# Patient Record
Sex: Female | Born: 1965
Health system: Southern US, Community
[De-identification: ages and names within clinical notes are randomized; demographics above are authoritative.]

## PROBLEM LIST (undated history)

## (undated) DIAGNOSIS — O24419 Gestational diabetes mellitus in pregnancy, unspecified control: Secondary | ICD-10-CM

## (undated) DIAGNOSIS — I2510 Atherosclerotic heart disease of native coronary artery without angina pectoris: Secondary | ICD-10-CM

## (undated) DIAGNOSIS — E119 Type 2 diabetes mellitus without complications: Secondary | ICD-10-CM

## (undated) DIAGNOSIS — I10 Essential (primary) hypertension: Secondary | ICD-10-CM

## (undated) DIAGNOSIS — I639 Cerebral infarction, unspecified: Secondary | ICD-10-CM

## (undated) HISTORY — PX: BREAST SURGERY: SHX581

## (undated) HISTORY — DX: Atherosclerotic heart disease of native coronary artery without angina pectoris: I25.10

## (undated) HISTORY — DX: Type 2 diabetes mellitus without complications: E11.9

## (undated) HISTORY — DX: Gestational diabetes mellitus in pregnancy, unspecified control: O24.419

## (undated) HISTORY — DX: Essential (primary) hypertension: I10

---

## 1998-08-17 ENCOUNTER — Other Ambulatory Visit: Admission: RE | Admit: 1998-08-17 | Discharge: 1998-08-17 | Payer: Self-pay | Admitting: Obstetrics

## 2000-08-28 ENCOUNTER — Other Ambulatory Visit: Admission: RE | Admit: 2000-08-28 | Discharge: 2000-08-28 | Payer: Self-pay | Admitting: Obstetrics

## 2000-08-30 ENCOUNTER — Encounter: Admission: RE | Admit: 2000-08-30 | Discharge: 2000-08-30 | Payer: Self-pay | Admitting: Obstetrics

## 2000-08-30 ENCOUNTER — Encounter: Payer: Self-pay | Admitting: Obstetrics

## 2001-02-10 ENCOUNTER — Inpatient Hospital Stay (HOSPITAL_COMMUNITY): Admission: AD | Admit: 2001-02-10 | Discharge: 2001-02-10 | Payer: Self-pay | Admitting: *Deleted

## 2003-07-15 ENCOUNTER — Emergency Department (HOSPITAL_COMMUNITY): Admission: EM | Admit: 2003-07-15 | Discharge: 2003-07-15 | Payer: Self-pay | Admitting: Emergency Medicine

## 2004-06-02 ENCOUNTER — Emergency Department (HOSPITAL_COMMUNITY): Admission: EM | Admit: 2004-06-02 | Discharge: 2004-06-02 | Payer: Self-pay | Admitting: Emergency Medicine

## 2004-10-12 ENCOUNTER — Emergency Department (HOSPITAL_COMMUNITY): Admission: EM | Admit: 2004-10-12 | Discharge: 2004-10-12 | Payer: Self-pay | Admitting: Emergency Medicine

## 2004-10-13 ENCOUNTER — Ambulatory Visit (HOSPITAL_COMMUNITY): Admission: RE | Admit: 2004-10-13 | Discharge: 2004-10-13 | Payer: Self-pay | Admitting: Emergency Medicine

## 2006-01-05 ENCOUNTER — Encounter: Admission: RE | Admit: 2006-01-05 | Discharge: 2006-01-05 | Payer: Self-pay | Admitting: Obstetrics & Gynecology

## 2006-03-01 ENCOUNTER — Emergency Department (HOSPITAL_COMMUNITY): Admission: EM | Admit: 2006-03-01 | Discharge: 2006-03-01 | Payer: Self-pay | Admitting: Emergency Medicine

## 2006-12-03 ENCOUNTER — Inpatient Hospital Stay (HOSPITAL_COMMUNITY): Admission: AD | Admit: 2006-12-03 | Discharge: 2006-12-03 | Payer: Self-pay | Admitting: Obstetrics

## 2007-01-07 ENCOUNTER — Inpatient Hospital Stay (HOSPITAL_COMMUNITY): Admission: AD | Admit: 2007-01-07 | Discharge: 2007-01-07 | Payer: Self-pay | Admitting: Obstetrics & Gynecology

## 2007-06-04 ENCOUNTER — Ambulatory Visit (HOSPITAL_COMMUNITY): Admission: RE | Admit: 2007-06-04 | Discharge: 2007-06-04 | Payer: Self-pay | Admitting: Obstetrics & Gynecology

## 2007-08-09 ENCOUNTER — Ambulatory Visit (HOSPITAL_COMMUNITY): Admission: RE | Admit: 2007-08-09 | Discharge: 2007-08-09 | Payer: Self-pay | Admitting: Obstetrics & Gynecology

## 2007-08-16 ENCOUNTER — Ambulatory Visit (HOSPITAL_COMMUNITY): Admission: RE | Admit: 2007-08-16 | Discharge: 2007-08-16 | Payer: Self-pay | Admitting: Obstetrics & Gynecology

## 2007-09-26 ENCOUNTER — Inpatient Hospital Stay (HOSPITAL_COMMUNITY): Admission: AD | Admit: 2007-09-26 | Discharge: 2007-09-28 | Payer: Self-pay | Admitting: Obstetrics

## 2007-10-30 ENCOUNTER — Inpatient Hospital Stay (HOSPITAL_COMMUNITY): Admission: AD | Admit: 2007-10-30 | Discharge: 2007-10-30 | Payer: Self-pay | Admitting: Obstetrics

## 2007-12-11 ENCOUNTER — Inpatient Hospital Stay (HOSPITAL_COMMUNITY): Admission: AD | Admit: 2007-12-11 | Discharge: 2007-12-12 | Payer: Self-pay | Admitting: Obstetrics & Gynecology

## 2007-12-18 ENCOUNTER — Inpatient Hospital Stay (HOSPITAL_COMMUNITY): Admission: AD | Admit: 2007-12-18 | Discharge: 2007-12-20 | Payer: Self-pay | Admitting: Obstetrics

## 2007-12-20 DIAGNOSIS — O24419 Gestational diabetes mellitus in pregnancy, unspecified control: Secondary | ICD-10-CM

## 2007-12-20 HISTORY — DX: Gestational diabetes mellitus in pregnancy, unspecified control: O24.419

## 2007-12-20 HISTORY — PX: TUBAL LIGATION: SHX77

## 2007-12-24 ENCOUNTER — Inpatient Hospital Stay (HOSPITAL_COMMUNITY): Admission: AD | Admit: 2007-12-24 | Discharge: 2007-12-24 | Payer: Self-pay | Admitting: Obstetrics & Gynecology

## 2007-12-25 ENCOUNTER — Inpatient Hospital Stay (HOSPITAL_COMMUNITY): Admission: AD | Admit: 2007-12-25 | Discharge: 2007-12-27 | Payer: Self-pay | Admitting: Obstetrics

## 2008-07-22 ENCOUNTER — Encounter: Admission: RE | Admit: 2008-07-22 | Discharge: 2008-10-08 | Payer: Self-pay | Admitting: Obstetrics & Gynecology

## 2008-09-15 ENCOUNTER — Inpatient Hospital Stay (HOSPITAL_COMMUNITY): Admission: AD | Admit: 2008-09-15 | Discharge: 2008-09-19 | Payer: Self-pay | Admitting: Obstetrics

## 2008-10-25 ENCOUNTER — Inpatient Hospital Stay (HOSPITAL_COMMUNITY): Admission: AD | Admit: 2008-10-25 | Discharge: 2008-10-25 | Payer: Self-pay | Admitting: Obstetrics

## 2008-11-27 ENCOUNTER — Inpatient Hospital Stay (HOSPITAL_COMMUNITY): Admission: AD | Admit: 2008-11-27 | Discharge: 2008-12-01 | Payer: Self-pay | Admitting: Obstetrics & Gynecology

## 2008-11-28 ENCOUNTER — Encounter: Payer: Self-pay | Admitting: Obstetrics & Gynecology

## 2008-12-02 ENCOUNTER — Inpatient Hospital Stay (HOSPITAL_COMMUNITY): Admission: AD | Admit: 2008-12-02 | Discharge: 2008-12-02 | Payer: Self-pay | Admitting: Obstetrics & Gynecology

## 2011-01-10 ENCOUNTER — Encounter: Payer: Self-pay | Admitting: Obstetrics & Gynecology

## 2011-05-03 NOTE — Discharge Summary (Signed)
NAMEJERRIAH, [name]                ACCOUNT NO.:  [id number]   MEDICAL RECORD NO.:  [id number]          PATIENT TYPE:  INP   LOCATION:  9159                          FACILITY:  [facility]   PHYSICIAN:  Roseanna Rainbow, M.D.DATE OF BIRTH:  [birth year]   DATE OF ADMISSION:  09/15/2008  DATE OF DISCHARGE:  09/19/2008                               DISCHARGE SUMMARY   CHIEF COMPLAINT:  The patient is a 45 year old with an intrauterine  pregnancy at 25 weeks with gestational diabetes, now with worsening  hyperglycemia.  Please see the dictated history and physical for further  details.   HOSPITAL COURSE:  The patient was admitted.  Her insulin regimen was  titrated to achieve better glycemic control.  Although suboptimal, her  CBGs had improved during the hospitalization and at the time of  discharge were in the 160-250 range.   DISCHARGE DIAGNOSES:  1. Pregestational diabetes mellitus.  2. Hyperglycemia.  3. Intrauterine pregnancy at 25 plus weeks.   CONDITION:  Stable.   DIET:  ADA diet.   MEDICATIONS:  Lantus and Humalog.   ACTIVITY:  Ad lib.   DISPOSITION:  The patient was to follow up in the office in 1 week.      Roseanna Rainbow, M.D.  Electronically Signed     LAJ/MEDQ  D:  10/17/2008  T:  10/17/2008  Job:  [id number]

## 2011-05-03 NOTE — H&P (Signed)
[name], [name]                ACCOUNT NO.:  [id number]   MEDICAL RECORD NO.:  [id number]          PATIENT TYPE:  INP   LOCATION:  9156                          [facility]AC[facility]  [facility]   PHYSICIAN:  Roseanna Rainbow, M.D.DATE OF BIRTH:  01-30-1966   DATE OF ADMISSION:  09/26/2007  DATE OF DISCHARGE:                              HISTORY & PHYSICAL   CHIEF COMPLAINT:  The patient is a 45 year old gravida 8, para 3, with  an estimated date of confinement of January 12, 2008, with an  intrauterine pregnancy at 24+ weeks and gestational diabetes with acute  hyperglycemia.   HISTORY OF PRESENT ILLNESS:  Please see the above.  The patient had  previously been managed with glyburide; however, her fasting CBGs  remained in poor control.  This week per her report her regimen was  changed to Lantus and Humalog.  On presentation to the office today,  urine dip was remarkable for 4+ glycosuria, no ketones, and a CBG was  302.   ALLERGIES:  No known drug allergies.   MEDICATIONS:  Please see the medication reconciliation form.   OB RISK FACTORS:  1. Low-lying placenta.  2. Gestational diabetes.  3. Sickle cell trait.  The father of the baby is hemoglobin AA per her      report.  4. Uterine fibroids.  5. Advanced maternal age.  6. A history of a large for gestational age infant.   PAST OBSTETRICAL HISTORY:  In October 1990 there was in a voluntary  termination of pregnancy.  In March 1992 there was a spontaneous  abortion.  In September 1994 she was delivered of a liveborn female, 5  pounds 14 ounces, post-dates vaginal delivery.  In October 1997 she was  delivered of a 9 pound female vaginal delivery, full-term.  In May 2002  she was delivered of a liveborn female, 8 pounds, full-term vaginal  delivery.  This pregnancy was complicated by gestational diabetes.  In  December 2005 there was a voluntary termination of pregnancy.  In  January 2008 there was a spontaneous abortion.   PRENATAL LABS:  Chlamydia probe negative.  Urine culture and  sensitivity:  No growth.  GC probe negative.  Hepatitis B surface  antigen negative.  Hematocrit 35, hemoglobin 11.5.  HIV nonreactive.  Rubella immune.  Sickle cell positive.  Blood type is B+, antibody  screen negative.   PAST GYN HISTORY:  Noncontributory.   PAST MEDICAL HISTORY:  No significant history of medical diseases.   PAST SURGICAL HISTORY:  Bilateral breast reduction.   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  She is married, living with her spouse.  Does not give any significant  history of alcohol usage, has no significant smoking history.  Denies  illicit drug use.   FAMILY HISTORY:  Adult-onset diabetes, hypertension, prostate cancer.   PHYSICAL EXAM:  VITAL SIGNS:  Stable, afebrile.  Please see the above.  GENERAL:  No apparent distress.  ABDOMEN:  Gravid.  PELVIC EXAM:  Deferred.   ASSESSMENT:  Multipara at 24 weeks with gestational diabetes,  likely  adult-onset diabetes type 2 to with hyperglycemia.   PLAN:  Admission.  We will adjust her insulin regimen and cover as  needed.  IV hydration.  Check a CBC, CMET, urinalysis and urine culture  and sensitivity.  Heightened fetal surveillance as well with NSTs each  shift.      Roseanna Rainbow, M.D.  Electronically Signed     LAJ/MEDQ  D:  09/26/2007  T:  09/27/2007  Job:  [id number]

## 2011-05-03 NOTE — Discharge Summary (Signed)
[name], [name]                ACCOUNT NO.:  [id number]   MEDICAL RECORD NO.:  [id number]          PATIENT TYPE:  INP   LOCATION:  9156                          [facility]   PHYSICIAN:  Roseanna Rainbow, M.D.DATE OF BIRTH:  04-09-1966   DATE OF ADMISSION:  09/26/2007  DATE OF DISCHARGE:  09/28/2007                               DISCHARGE SUMMARY   CHIEF COMPLAINT:  The patient is a 45 year old gravida 8, para 3, with  an estimated date of confinement of January 24 with an intrauterine  pregnancy at 24+ weeks with gestational diabetes and acute  hyperglycemia; please see the dictated history and physical for further  details.   HOSPITAL COURSE:  The patient was admitted.  Her insulin regimen was  continued, as well as a sliding scale for coverage.  A urinalysis was  remarkable for greater than 1000 mg/dL of glucose.  Hemoglobin was 11.2;  white blood cell count was 9300.  Her CBGs were in the 160-230 range.  Her Lantus dose was adjusted.  She was subsequently discharged to home.   DISCHARGE DIAGNOSES:  1. Intrauterine pregnancy at 24+ weeks.  2. Gestational diabetes.  3. Acute hyperglycemia.   CONDITION:  Stable.   DIET:  ADA diet.   MEDICATIONS:  Included Lantus and Humalog and prenatal vitamins.   DISPOSITION:  The patient was to follow up in the office in several  days.      Roseanna Rainbow, M.D.  Electronically Signed     LAJ/MEDQ  D:  10/19/2007  T:  10/21/2007  Job:  [id number]

## 2011-05-03 NOTE — H&P (Signed)
NAMELEYAN, BRANDEN                ACCOUNT NO.:  [id number]   MEDICAL RECORD NO.:  [id number]          PATIENT TYPE:  INP   LOCATION:  9159                          [facility]   PHYSICIAN:  Roseanna Rainbow, M.D.DATE OF BIRTH:  1966-09-14   DATE OF ADMISSION:  09/15/2008  DATE OF DISCHARGE:                              HISTORY & PHYSICAL   CHIEF COMPLAINT:  The patient is a 45 year old with an intrauterine  pregnancy at 25 weeks with gestational diabetes, now with worsening  hyperglycemia.   HISTORY OF PRESENT ILLNESS:  Please see the above.  The patient has been  followed collaboratively at [facility] for management of her  diabetes.  The patient is quite insulin resistance, and there is also  quite good questionable compliance with the diet.  When she presented to  the office earlier today, her random CBG was 400.  On urine dip in the  office, there were no ketones present.  The patient also complained of  increased sweating.  No other complaints.   PAST OB RISK FACTORS ADVANCED MATERNAL AGE:  Please see the above.   PAST SURGICAL HISTORY:  Breast reduction.   PAST MEDICAL HISTORY:  Please see the above.   MEDICATIONS:  Please see the medication reconciliation form.   ALLERGIES:  No known drug allergies.   SOCIAL HISTORY:  She is married.  She denies any tobacco, ethanol, or  drug use.   FAMILY HISTORY:  Positive for diabetes and hypertension.   PAST OBSTETRICAL HISTORY:  There is a history of 4 spontaneous vaginal  deliveries.   REVIEW OF SYSTEMS:  General, please see the above.   PHYSICAL EXAMINATION:  VITAL SIGNS:  Stable.  Afebrile.  Fetal heart  tracing consistent with a 25-week gestation.  PELVIC:  Deferred.   LABORATORIES:  Sodium 133, glucose 292, white blood cell count 9.5,  hemoglobin of 11.2, urine specific gravity less than 1.005.  Urine  glucose greater than 1000, ketones negative.   ASSESSMENT:  Intrauterine pregnancy at 25 weeks with   gestational  diabetes, now with hyperglycemia.   PLAN:  Admission, titrate the patient's out patient regimen.  We will  cover for elevated CBCs with a sliding scale.  IV hydration.      Roseanna Rainbow, M.D.  Electronically Signed     LAJ/MEDQ  D:  09/16/2008  T:  09/16/2008  Job:  [id number]

## 2011-05-03 NOTE — Op Note (Signed)
[name], [name]                ACCOUNT NO.:  [id number]   MEDICAL RECORD NO.:  [id number]          PATIENT TYPE:  INP   LOCATION:  9310                          [facility]   PHYSICIAN:  Roseanna Rainbow, M.D.DATE OF BIRTH:  [birth year]   DATE OF PROCEDURE:  11/27/2008  DATE OF DISCHARGE:                               OPERATIVE REPORT   PREOPERATIVE DIAGNOSES:  1. Intrauterine pregnancy at 34+ weeks.  2. Gestational diabetes, insulin dependent.  3. Acute hyperglycemia, hyperosmolar  4. Biophysical profile 4/8 variable decelerations  5. Desires a sterilization procedure.   POSTOPERATIVE DIAGNOSES:  1. Intrauterine pregnancy at 34+ weeks.  2. Gestational diabetes, insulin dependent.  3. Acute hyperglycemia, hyperosmolar  4. Biophysical profile 4/8 variable decelerations  5. Desires a sterilization procedure.  6. Uterine fibroids.   PROCEDURE:  Primary low-uterine flap elliptical cesarean delivery,  myomectomy, modified Pomeroy bilateral tubal ligation via a Pfannenstiel  skin incision.   SURGEON:  Roseanna Rainbow, MD   ANESTHESIA:  Spinal.   FINDINGS:  Lower uterine segment intramural myoma approximately 6 cm in  diameter, necrotic.   PATHOLOGY:  Myoma, portions of fallopian tubes.   ESTIMATED BLOOD LOSS:  600 mL.   COMPLICATIONS:  None.   PROCEDURE:  The patient was taken to the operating room with an IV  running.  She was given a spinal anesthetic.  She was then placed in the  dorsal supine position with left lateral tilt.  She was prepped and  draped in the usual sterile fashion.  After a time-out had been  completed, a Pfannenstiel skin incision was then made with a scalpel.  This was carried down to the underlying fascia.  The fascia was nicked  in the midline.  The fascial incision was then extended bilaterally.  The superior aspect of the fascial incision was tented up and the  underlying rectus muscles dissected off.  The inferior aspect of  the  fascial incision was manipulated in a similar fashion.  The rectus  muscles were separated in the midline.  The parietal peritoneum was  tented up and entered sharply.  This incision was then extended  superiorly and inferiorly with good visualization of the bladder.  The  bladder blade was then placed.  The vesicouterine peritoneum was tented  up and entered sharply.  This incision was then extended bilaterally and  the bladder flap created bluntly.  The lower uterine segment was incised  in a transverse fashion with a scalpel.  This incision was then extended  with the bandage scissors.  The infant's head was then delivered  atraumatically.  The oropharynx was suctioned with bulb suction.  The  cord was clamped and cut.  The infant was handed off to the awaiting  neonatologist.  Then, the umbilical artery pH was sent.  The Apgars were  3 and 6 at one and five minutes respectively.  The placenta was then  removed.  The intrauterine cavity was evacuated of any remaining  amniotic fluid clots and debris with moistened laparotomy sponge.  At  this point, the lower uterine segment myoma that  was visualized and  palpated prior to the uterine incision was noted.  To facilitate closure  of the uterine incision, the decision was made to excise the myoma.  The  serosa overlying the myoma was infiltrated with a dilute Pitressin  solution.  The serosa was incised using the Bovie down to the fibrous  capsule of the myoma.  The myoma was then grasped with a towel clip and  using sharp and blunt dissection, it was enucleated.  The defect was  then closed in layers using running sutures of 0-Monocryl.  The serosa  was closed with a running suture of 2-0 Monocryl.  The uterine incision  was then reapproximated in a running interlocking fashion.  Adequate  hemostasis was noted.  The mid isthmic portion of the left fallopian  tube was then grasped.  A 1-2 cm segment of tube was then doubly ligated   with 0-plain and excised.  The right fallopian tube was manipulated in a  similar fashion.  The paracolic gutters were then copiously irrigated.  The parietal peritoneum was reapproximated in a running fashion using 2-  0 Vicryl.  This fascia was closed using 2 sutures of 0-Vicryl in a  running fashion tied in the midline.  The skin was closed with staples.  At the close of the procedure, the instrument and pack counts were said  to be correct x2.  The patient was taken to the PACU awake and in stable  condition.      Roseanna Rainbow, M.D.  Electronically Signed     LAJ/MEDQ  D:  11/28/2008  T:  11/28/2008  Job:  [id number]

## 2011-05-03 NOTE — Discharge Summary (Signed)
[name], [name]                ACCOUNT NO.:  [id number]   MEDICAL RECORD NO.:  [id number]          PATIENT TYPE:  INP   LOCATION:  9310                          [facility]ILITY[facility]   PHYSICIAN:  Charles A. Clearance Coots, M.D.DATE OF BIRTH:  26-Oct-1966   DATE OF ADMISSION:  11/27/2008  DATE OF DISCHARGE:  12/01/2008                               DISCHARGE SUMMARY   ADMITTING DIAGNOSES:  1. 34 weeks' gestation.  2. Gestational diabetes mellitus.  3. Variable fetal heart rate decelerations.  4. Recent onset of acute hyperglycemic hyperosmolar episode.  5. Desired permanent sterilization.   DISCHARGE DIAGNOSES:  1. 34 weeks' gestation.  2. Gestational diabetes mellitus.  3. Variable fetal heart rate decelerations.  4. Recent onset of acute hyperglycemic hyperosmolar episode.  5. Desired permanent sterilization.  6. Status post primary low-transverse cesarean section.  7. Myomectomy.  8. Modified Pomeroy bilateral tubal ligation on November 28, 2008.   Apgars were 3 at one minute and 6 at five minutes.  Mother discharged  home on postop day #3 in good condition.  Infant was the taken to the  Neonatal Intensive Care Unit for prematurity   HOSPITAL COURSE:  A 45 year old para 4, estimated date of confinement of  December 29, 2008, with gestational diabetes requiring insulin and oral  hypoglycemic agent, presented to the office with nonreactive nonstress  test and decreased fetal movement.  NST revealed fetal heart rate 160-  170 beats per minute with spontaneous moderate variable fetal heart rate  decelerations.  Antenatal testing at [facility] earlier this week  revealed amniotic fluid index of 9.  The patient has had decreased  appetite.  Last insulin dose was this a.m.   PAST SURGERY:  Breast reduction.   ILLNESSES:  Gestational diabetes, migraine headaches.   MEDICATIONS:  Insulin, Humalog, metformin, prenatal vitamins.   ALLERGIES:  No known drug allergies.   SOCIAL HISTORY:   Works in Clinical biochemist.  Married.  Negative tobacco,  alcohol, or recreational drug use.   PAST OBSTETRICAL HISTORY:  Remarkable for 4 normal spontaneous vaginal  deliveries, uncomplicated.   PHYSICAL EXAMINATION:  Well nourished, well developed female in no acute  distress.  Fetal heart tone was 150-160 beats per minute.  No  accelerations.  Spontaneous mild-to-moderate variable decelerations.  There were no uterine contractions.   ADMITTING LABS:  Hemoglobin 13, glucose 436, potassium 4.3, sodium 130.   HOSPITAL COURSE:  The patient was admitted and biophysical profile was  done, which revealed 4/10.  Fetal status appeared to be improving with  better glycemic control after Glucometer and the use of Glucomander to  reduce the glucose serum level.  Repeat biophysical profile was done the  following morning after admission after the patient continued to have  periodic moderate variable fetal heart rate decelerations over 90.  Repeat biophysical profile was 4/8.  A decision was made to proceed with  cesarean section delivery because of the persistent moderate fetal heart  rate variable decelerations with gestational diabetes for glucose  control.  Primary low-transverse cesarean section and bilateral tubal  ligation were performed on November 28, 2008.  There were no  intraoperative complications.  Postoperative course was uncomplicated.  The patient was discharged home on postop day #3 in good condition.   DISCHARGE LABS:  Hemoglobin 9.4, hematocrit 28.2, white blood cell count  9700, platelets 155,000.  Capillary blood glucose was 188.   DISCHARGE DISPOSITION:  Medications, continue prenatal vitamins,  metformin 2 g p.o. with dinner daily was prescribed for glucose control.  The patient will follow up with Endocrinology for further management of  possible adult-onset diabetes.  Darvocet-N 100 and ibuprofen was  prescribed for pain.  Routine written instructions were given for   discharge after cesarean section.  The patient is to call office for  followup appointment in 2 weeks.      Charles A. Clearance Coots, M.D.  Electronically Signed     CAH/MEDQ  D:  12/01/2008  T:  12/01/2008  Job:  [id number]

## 2011-09-08 LAB — WET PREP, GENITAL: Yeast Wet Prep HPF POC: NONE SEEN

## 2011-09-08 LAB — CBC
HCT: 38.5
Hemoglobin: 10.1 — ABNORMAL LOW
MCHC: 33.6
Platelets: 240
RBC: 3.71 — ABNORMAL LOW
WBC: 10.8 — ABNORMAL HIGH

## 2011-09-19 LAB — BASIC METABOLIC PANEL
BUN: 4 — ABNORMAL LOW
CO2: 23
Chloride: 103
Creatinine, Ser: 0.49

## 2011-09-19 LAB — URINE CULTURE
Colony Count: >=100000
Special Requests: NEGATIVE

## 2011-09-19 LAB — GLUCOSE, CAPILLARY
Glucose-Capillary: 175 — ABNORMAL HIGH
Glucose-Capillary: 186 — ABNORMAL HIGH
Glucose-Capillary: 186 — ABNORMAL HIGH
Glucose-Capillary: 201 — ABNORMAL HIGH
Glucose-Capillary: 240 — ABNORMAL HIGH
Glucose-Capillary: 330 — ABNORMAL HIGH
Glucose-Capillary: 123 — ABNORMAL HIGH
Glucose-Capillary: 212 — ABNORMAL HIGH
Glucose-Capillary: 223 — ABNORMAL HIGH

## 2011-09-19 LAB — OSMOLALITY: Osmolality: 285

## 2011-09-19 LAB — DIFFERENTIAL
Basophils Relative: 0
Eosinophils Absolute: 0.1
Eosinophils Relative: 1
Monocytes Relative: 6
Neutrophils Relative %: 71

## 2011-09-19 LAB — CBC
HCT: 34.1 — ABNORMAL LOW
MCHC: 32.9
MCV: 84.1
Platelets: 231

## 2011-09-19 LAB — URINALYSIS, ROUTINE W REFLEX MICROSCOPIC
Ketones, ur: NEGATIVE
Leukocytes, UA: NEGATIVE
Nitrite: NEGATIVE
Protein, ur: NEGATIVE
Urobilinogen, UA: 0.2

## 2011-09-23 LAB — CBC
HCT: 28.2 % — ABNORMAL LOW (ref 36.0–46.0)
HCT: 41.1 % (ref 36.0–46.0)
Hemoglobin: 13.6 g/dL (ref 12.0–15.0)
Hemoglobin: 9.4 g/dL — ABNORMAL LOW (ref 12.0–15.0)
MCHC: 33 g/dL (ref 30.0–36.0)
MCHC: 33.5 g/dL (ref 30.0–36.0)
MCV: 81 fL (ref 78.0–100.0)
MCV: 81.6 fL (ref 78.0–100.0)
Platelets: 155 10*3/uL (ref 150–400)
Platelets: 207
RDW: 14.7 % (ref 11.5–15.5)
RDW: 15.2 % (ref 11.5–15.5)
RDW: 14.2
WBC: 9.8

## 2011-09-23 LAB — URINALYSIS, ROUTINE W REFLEX MICROSCOPIC
Bilirubin Urine: NEGATIVE
Glucose, UA: 250 — AB
Hgb urine dipstick: NEGATIVE
Ketones, ur: NEGATIVE
Protein, ur: NEGATIVE
Urobilinogen, UA: 0.2

## 2011-09-23 LAB — COMPREHENSIVE METABOLIC PANEL
AST: 18
Albumin: 2.9 — ABNORMAL LOW
Alkaline Phosphatase: 142 U/L — ABNORMAL HIGH (ref 39–117)
Alkaline Phosphatase: 136 — ABNORMAL HIGH
BUN: 5 mg/dL — ABNORMAL LOW (ref 6–23)
BUN: 8 mg/dL (ref 6–23)
BUN: 3 — ABNORMAL LOW
CO2: 26 mEq/L (ref 19–32)
Calcium: 9.8 mg/dL (ref 8.4–10.5)
Chloride: 106 mEq/L (ref 96–112)
Chloride: 104
Creatinine, Ser: 0.55
GFR calc Af Amer: >60
GFR calc non Af Amer: >60 mL/min (ref >60)
Glucose, Bld: 163 mg/dL — ABNORMAL HIGH (ref 70–99)
Glucose, Bld: 436 mg/dL — ABNORMAL HIGH (ref 70–99)
Potassium: 3.8 mEq/L (ref 3.5–5.1)
Potassium: 3.9
Total Bilirubin: 0.3 mg/dL (ref 0.3–1.2)
Total Bilirubin: 0.5
Total Protein: 5.2 g/dL — ABNORMAL LOW (ref 6.0–8.3)
Total Protein: 6.3 g/dL (ref 6.0–8.3)
Total Protein: 6.4

## 2011-09-23 LAB — CREATININE CLEARANCE, URINE, 24 HOUR
Collection Interval-CRCL: 24
Creatinine, 24H Ur: 889
Creatinine, Urine: 50.8
Creatinine: 0.55
Urine Total Volume-CRCL: 1750

## 2011-09-23 LAB — GLUCOSE, CAPILLARY
Glucose-Capillary: 186 mg/dL — ABNORMAL HIGH (ref 70–99)
Glucose-Capillary: 188 mg/dL — ABNORMAL HIGH (ref 70–99)
Glucose-Capillary: 191 mg/dL — ABNORMAL HIGH (ref 70–99)
Glucose-Capillary: 203 mg/dL — ABNORMAL HIGH (ref 70–99)
Glucose-Capillary: 284 mg/dL — ABNORMAL HIGH (ref 70–99)
Glucose-Capillary: 346 mg/dL — ABNORMAL HIGH (ref 70–99)

## 2011-09-23 LAB — URINE CULTURE
Colony Count: 65000
Special Requests: NEGATIVE

## 2011-09-23 LAB — URIC ACID: Uric Acid, Serum: 5

## 2011-09-23 LAB — RPR
RPR Ser Ql: NONREACTIVE
RPR Ser Ql: NONREACTIVE

## 2011-09-23 LAB — PROTEIN, URINE, 24 HOUR
Protein, 24H Urine: 88
Urine Total Volume-UPROT: 1750

## 2011-09-23 LAB — SAMPLE TO BLOOD BANK

## 2011-09-27 LAB — COMPREHENSIVE METABOLIC PANEL
AST: 18
Albumin: 2.7 — ABNORMAL LOW
Chloride: 102
Creatinine, Ser: 0.55
GFR calc Af Amer: >60
Potassium: 3.7
Sodium: 134 — ABNORMAL LOW
Total Bilirubin: 0.2 — ABNORMAL LOW

## 2011-09-27 LAB — URINALYSIS, ROUTINE W REFLEX MICROSCOPIC
Bilirubin Urine: NEGATIVE
Glucose, UA: 250 — AB
Hgb urine dipstick: NEGATIVE
Ketones, ur: NEGATIVE
Nitrite: NEGATIVE
Protein, ur: NEGATIVE
Specific Gravity, Urine: 1.015
Urobilinogen, UA: 0.2
pH: 5.5

## 2011-09-27 LAB — CBC
MCV: 81.8
Platelets: 231
WBC: 8.9

## 2011-09-29 LAB — COMPREHENSIVE METABOLIC PANEL
ALT: 14
AST: 27
Alkaline Phosphatase: 67
CO2: 23
Chloride: 103
GFR calc Af Amer: >60
GFR calc non Af Amer: >60
Sodium: 134 — ABNORMAL LOW
Total Bilirubin: 0.7

## 2011-09-29 LAB — URINALYSIS, ROUTINE W REFLEX MICROSCOPIC
Ketones, ur: NEGATIVE
Leukocytes, UA: NEGATIVE
Nitrite: NEGATIVE
Protein, ur: NEGATIVE

## 2011-09-29 LAB — URINE CULTURE: Colony Count: 50000

## 2011-09-29 LAB — URINE MICROSCOPIC-ADD ON

## 2011-09-29 LAB — CBC
MCV: 82.5
RBC: 3.99
WBC: 9.3

## 2011-09-29 LAB — DIFFERENTIAL
Basophils Absolute: 0
Eosinophils Absolute: 0.1
Eosinophils Relative: 1

## 2011-12-20 DIAGNOSIS — E119 Type 2 diabetes mellitus without complications: Secondary | ICD-10-CM

## 2011-12-20 HISTORY — DX: Type 2 diabetes mellitus without complications: E11.9

## 2014-09-07 DIAGNOSIS — I10 Essential (primary) hypertension: Secondary | ICD-10-CM

## 2014-09-07 DIAGNOSIS — I639 Cerebral infarction, unspecified: Secondary | ICD-10-CM

## 2014-09-07 HISTORY — DX: Essential (primary) hypertension: I10

## 2014-09-07 HISTORY — DX: Cerebral infarction, unspecified: I63.9

## 2015-01-27 ENCOUNTER — Encounter (HOSPITAL_COMMUNITY): Payer: Self-pay

## 2015-01-27 ENCOUNTER — Emergency Department (HOSPITAL_COMMUNITY)
Admission: EM | Admit: 2015-01-27 | Discharge: 2015-01-27 | Disposition: A | Discharge status: Home / Self Care | Payer: Medicaid Other | Attending: Emergency Medicine | Admitting: Emergency Medicine

## 2015-01-27 DIAGNOSIS — Y998 Other external cause status: Secondary | ICD-10-CM | POA: Diagnosis not present

## 2015-01-27 DIAGNOSIS — X12XXXA Contact with other hot fluids, initial encounter: Secondary | ICD-10-CM | POA: Diagnosis not present

## 2015-01-27 DIAGNOSIS — Z8673 Personal history of transient ischemic attack (TIA), and cerebral infarction without residual deficits: Secondary | ICD-10-CM | POA: Insufficient documentation

## 2015-01-27 DIAGNOSIS — T2122XA Burn of second degree of abdominal wall, initial encounter: Secondary | ICD-10-CM

## 2015-01-27 DIAGNOSIS — Y9389 Activity, other specified: Secondary | ICD-10-CM | POA: Diagnosis not present

## 2015-01-27 DIAGNOSIS — Y9289 Other specified places as the place of occurrence of the external cause: Secondary | ICD-10-CM | POA: Insufficient documentation

## 2015-01-27 HISTORY — DX: Cerebral infarction, unspecified: I63.9

## 2015-01-27 MED ORDER — SILVER SULFADIAZINE 1 % EX CREA
TOPICAL_CREAM | Freq: Once | CUTANEOUS | Status: AC
  Administered 2015-01-27: 11:00:00 via TOPICAL
  Filled 2015-01-27: qty 50
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
## 2015-01-27 NOTE — Discharge Instructions (Signed)
Apply Silvadene cream twice daily. Follow-up with your primary care physician in one week for reevaluation, if you do not have one, follow-up with the wellness clinic or the urgent care center.  Burn Care Your skin is a natural barrier to infection. It is the largest organ of your body. Burns damage this natural protection. To help prevent infection, it is very important to follow your caregiver's instructions in the care of your burn. Burns are classified as:  First degree. There is only redness of the skin (erythema). No scarring is expected.  Second degree. There is blistering of the skin. Scarring may occur with deeper burns.  Third degree. All layers of the skin are injured, and scarring is expected. HOME CARE INSTRUCTIONS   Wash your hands well before changing your bandage.  Change your bandage as often as directed by your caregiver.  Remove the old bandage. If the bandage sticks, you may soak it off with cool, clean water.  Cleanse the burn thoroughly but gently with mild soap and water.  Pat the area dry with a clean, dry cloth.  Apply a thin layer of antibacterial cream to the burn.  Apply a clean bandage as instructed by your caregiver.  Keep the bandage as clean and dry as possible.  Elevate the affected area for the first 24 hours, then as instructed by your caregiver.  Only take over-the-counter or prescription medicines for pain, discomfort, or fever as directed by your caregiver. SEEK IMMEDIATE MEDICAL CARE IF:   You develop excessive pain.  You develop redness, tenderness, swelling, or red streaks near the burn.  The burned area develops yellowish-white fluid (pus) or a bad smell.  You have a fever. MAKE SURE YOU:   Understand these instructions.  Will watch your condition.  Will get help right away if you are not doing well or get worse. Document Released: 12/05/2005 Document Revised: 02/27/2012 Document Reviewed: 04/27/2011 [facility] Patient  Information 2015 [place], [place]. This information is not intended to replace advice given to you by your health care provider. Make sure you discuss any questions you have with your health care provider.  Second-Degree Burn A second-degree burn affects the 2 outer layers of skin. The outer layer (epidermis) and the layer underneath it (dermis) are both burned. Another name for this type of burn is a partial thickness burn. A second-degree burn may be called minor or major. This depends on the size of the burn. It also depends on what parts of the skin are burned. Minor burns may be treated with first aid. Major burns are a medical emergency. A second-degree burn is worse than a first-degree burn, but not as bad as a third-degree burn. A first-degree burn affects only the epidermis. A third-degree burn goes through all the layers of skin. A second-degree burn usually heals in 3 to 4 weeks. A minor second-degree burn usually does not leave a scar.Deeper second-degree burns may lead to scarring of the skin or contractures over joints.Contractures are scars that form over joints and may lead to reduced mobility at those joints. CAUSES  Heat (thermal) injury. This happens when skin comes in contact with something very hot. It could be a flame, a hot object, hot liquid, or steam. Most second-degree burns are thermal injuries.  Radiation. Sunlight is one type of radiation that can burn the skin. Another type of radiation is used to heat food. Radiation is also used to treat some diseases, such as cancer. All types of radiation can burn the  skin. Sunlight usually causes a first-degree burn. Radiation used for heating food or treating a disease can cause a second-degree burn.  Electricity. Electrical burns can cause more damage under the skin than on the surface. They should always be treated as major burns.  Chemicals. Many chemicals can burn the skin. The burn should be flushed with cool water and checked by  an emergency caregiver. SYMPTOMS Symptoms of second-degree burns include:  Severe pain.  Extreme tenderness.  Deep redness.  Blistered skin.  Skin that has changed color.It might look blotchy, wet, or shiny.  Swelling. TREATMENT Some second-degree burns may need to be treated in a hospital. These include major burns, electrical burns, and chemical burns. Many other second-degree burns can be treated with regular first aid, such as:  Cooling the burn. Use cool, germ-free (sterile) salt water. Place the burned area of skin into a tub of water, or cover the burned area with clean, wet towels.  Taking pain medicine.  Removing the dead skin from broken blisters. A trained caregiver may do this. Do not pop blisters.  Gently washing your skin with mild soap.  Covering the burned area with a cream.Silver sulfadiazine is a cream for burns. An antibiotic cream, such as bacitracin, may also be used to fight infection. Do not use other ointments or creams unless your caregiver says it is okay.  Protecting the burn with a sterile, non-sticky bandage.  Bandaging fingers and toes separately. This keeps them from sticking together.  Taking an antibiotic. This can help prevent infection.  Getting a tetanus shot. HOME CARE INSTRUCTIONS Medication  Take any medicine prescribed by your caregiver. Follow the directions carefully.  Ask your caregiver if you can take over-the-counter medicine to relieve pain and swelling. Do not give aspirin to children.  Make sure your caregiver knows about all other medicines you take.This includes over-the-counter medicines. Burn care  You will need to change the bandage on your burn. You may need to do this 2 or 3 times each day.  Gently clean the burned area.  Put ointment on it.  Cover the burn with a sterile bandage.  For some deeper burns or burns that cover a large area, compression garments may be prescribed. These garments can help  minimize scarring and protect your mobility.  Do not put butter or oil on your skin. Use only the cream prescribed by your caregiver.  Do not put ice on your burn.  Do not break blisters on your skin.  Keep the bandaged area dry. You might need to take a sponge bath for awhile.Ask your caregiver when you can take a shower or a tub bath again.  Do not scratch an itchy burn. Your caregiver may give you medicine to relieve very bad itching.  Infection is a big danger after a second-degree burn. Tell your caregiver right away if you have signs of infection, such as:  Redness or changing color in the burned area.  Fluid leaking from the burn.  Swelling in the burn area.  A bad smell coming from the wound. Follow-up  Keep all follow-up appointments.This is important. This is how your caregiver can tell if your treatment is working.  Protect your burn from sunlight.Use sunscreen whenever you go outside.Burned areas may be sensitive to the sun for up to 1 year. Exposure to the sun may also cause permanent darkening of scars. SEEK MEDICAL CARE IF:  You have any questions about medicines.  You have any questions about your treatment.  You wonder if it is okay to do a particular activity.  You develop a fever of more than 100.5 F (38.1 C). SEEK IMMEDIATE MEDICAL CARE IF:  You think your burn might be infected. It may change color, become red, leak fluid, swell, or smell bad.  You develop a fever of more than 102 F (38.9 C). Document Released: 05/09/2011 Document Revised: 02/27/2012 Document Reviewed: 05/09/2011 [facility] Patient Information 2015 [place], [place]. This information is not intended to replace advice given to you by your health care provider. Make sure you discuss any questions you have with your health care provider.

## 2015-09-29 ENCOUNTER — Encounter: Payer: Self-pay | Admitting: Family Medicine

## 2015-09-29 ENCOUNTER — Ambulatory Visit: Payer: Self-pay | Attending: Family Medicine | Admitting: Family Medicine

## 2015-09-29 VITALS — BP 150/85 | HR 89 | Temp 98.8°F | Resp 16 | Ht 59.0 in | Wt 193.0 lb

## 2015-09-29 DIAGNOSIS — D509 Iron deficiency anemia, unspecified: Secondary | ICD-10-CM

## 2015-09-29 DIAGNOSIS — IMO0002 Reserved for concepts with insufficient information to code with codable children: Secondary | ICD-10-CM | POA: Insufficient documentation

## 2015-09-29 DIAGNOSIS — L309 Dermatitis, unspecified: Secondary | ICD-10-CM

## 2015-09-29 DIAGNOSIS — E1165 Type 2 diabetes mellitus with hyperglycemia: Secondary | ICD-10-CM

## 2015-09-29 DIAGNOSIS — I639 Cerebral infarction, unspecified: Secondary | ICD-10-CM | POA: Insufficient documentation

## 2015-09-29 DIAGNOSIS — I6300 Cerebral infarction due to thrombosis of unspecified precerebral artery: Secondary | ICD-10-CM

## 2015-09-29 DIAGNOSIS — B351 Tinea unguium: Secondary | ICD-10-CM

## 2015-09-29 DIAGNOSIS — I10 Essential (primary) hypertension: Secondary | ICD-10-CM | POA: Insufficient documentation

## 2015-09-29 DIAGNOSIS — I6930 Unspecified sequelae of cerebral infarction: Secondary | ICD-10-CM | POA: Insufficient documentation

## 2015-09-29 DIAGNOSIS — E119 Type 2 diabetes mellitus without complications: Secondary | ICD-10-CM | POA: Insufficient documentation

## 2015-09-29 DIAGNOSIS — B353 Tinea pedis: Secondary | ICD-10-CM

## 2015-09-29 DIAGNOSIS — E118 Type 2 diabetes mellitus with unspecified complications: Secondary | ICD-10-CM | POA: Insufficient documentation

## 2015-09-29 DIAGNOSIS — M25512 Pain in left shoulder: Secondary | ICD-10-CM

## 2015-09-29 LAB — COMPLETE METABOLIC PANEL WITH GFR
ALT: 15 U/L (ref 6–29)
AST: 18 U/L (ref 10–35)
Albumin: 4.1 g/dL (ref 3.6–5.1)
Alkaline Phosphatase: 73 U/L (ref 33–115)
BUN: 8 mg/dL (ref 7–25)
CHLORIDE: 98 mmol/L (ref 98–110)
CO2: 27 mmol/L (ref 20–31)
Calcium: 9.5 mg/dL (ref 8.6–10.2)
Creat: 0.57 mg/dL (ref 0.50–1.10)
GFR, Est African American: >89 mL/min (ref >=60)
GLUCOSE: 254 mg/dL — AB (ref 65–99)
POTASSIUM: 4.5 mmol/L (ref 3.5–5.3)
SODIUM: 133 mmol/L — AB (ref 135–146)
Total Bilirubin: 0.4 mg/dL (ref 0.2–1.2)
Total Protein: 6.8 g/dL (ref 6.1–8.1)

## 2015-09-29 LAB — LIPID PANEL
CHOL/HDL RATIO: 7.6 ratio — AB (ref <=5.0)
Cholesterol: 219 mg/dL — ABNORMAL HIGH (ref 125–200)
HDL: 29 mg/dL — AB (ref >=46)
LDL CALC: 165 mg/dL — AB (ref <130)
Triglycerides: 127 mg/dL (ref <150)
VLDL: 25 mg/dL (ref <30)

## 2015-09-29 LAB — CBC
HEMATOCRIT: 30.9 % — AB (ref 36.0–46.0)
HEMOGLOBIN: 9.7 g/dL — AB (ref 12.0–15.0)
MCH: 20.5 pg — AB (ref 26.0–34.0)
MCHC: 31.4 g/dL (ref 30.0–36.0)
MCV: 65.2 fL — ABNORMAL LOW (ref 78.0–100.0)
MPV: 10.8 fL (ref 8.6–12.4)
Platelets: 409 10*3/uL — ABNORMAL HIGH (ref 150–400)
RBC: 4.74 MIL/uL (ref 3.87–5.11)
RDW: 17.6 % — ABNORMAL HIGH (ref 11.5–15.5)
WBC: 7.8 10*3/uL (ref 4.0–10.5)

## 2015-09-29 LAB — POCT URINALYSIS DIPSTICK
BILIRUBIN UA: NEGATIVE
GLUCOSE UA: 500
KETONES UA: NEGATIVE
Leukocytes, UA: NEGATIVE
Nitrite, UA: NEGATIVE
PROTEIN UA: NEGATIVE
SPEC GRAV UA: 1.015
Urobilinogen, UA: 0.2
pH, UA: 6

## 2015-09-29 LAB — GLUCOSE, POCT (MANUAL RESULT ENTRY): POC GLUCOSE: 265 mg/dL — AB (ref 70–99)

## 2015-09-29 LAB — POCT GLYCOSYLATED HEMOGLOBIN (HGB A1C): HEMOGLOBIN A1C: 11.5

## 2015-09-29 MED ORDER — LOSARTAN POTASSIUM 25 MG PO TABS: 25.0000 mg | ORAL_TABLET | Freq: Every day | ORAL | Status: DC

## 2015-09-29 MED ORDER — TERBINAFINE HCL 1 % EX CREA: 1.0000 "application " | TOPICAL_CREAM | Freq: Two times a day (BID) | CUTANEOUS | Status: DC

## 2015-09-29 MED ORDER — SITAGLIPTIN PHOS-METFORMIN HCL 50-1000 MG PO TABS: 1.0000 | ORAL_TABLET | Freq: Two times a day (BID) | ORAL | Status: DC

## 2015-09-29 MED ORDER — ACCU-CHEK SOFTCLIX LANCETS MISC: 1.0000 | Freq: Three times a day (TID) | Status: DC

## 2015-09-29 MED ORDER — TRUEPLUS LANCETS 28G MISC: 1.0000 | Freq: Three times a day (TID) | Status: DC

## 2015-09-29 MED ORDER — ACCU-CHEK AVIVA PLUS W/DEVICE KIT: 1.0000 | PACK | Freq: Three times a day (TID) | Status: DC

## 2015-09-29 MED ORDER — GLUCOSE BLOOD VI STRP: 1.0000 | ORAL_STRIP | Freq: Three times a day (TID) | Status: DC

## 2015-09-29 MED ORDER — TRUE METRIX METER W/DEVICE KIT: 1.0000 | PACK | Status: DC | PRN

## 2015-09-29 NOTE — Patient Instructions (Addendum)
[name] was seen today for establish care, hospitalization follow-up and cerebrovascular accident.  Diagnoses and all orders for this visit:  Cerebrovascular accident (CVA) due to thrombosis of precerebral artery (HCC) -     Lipid Panel  Essential hypertension -     COMPLETE METABOLIC PANEL WITH GFR -     CBC -     losartan (COZAAR) 25 MG tablet; Take 1 tablet (25 mg total) by mouth daily.  Uncontrolled type 2 diabetes mellitus with complication, without long-term current use of insulin (HCC) -     POCT glycosylated hemoglobin (Hb A1C) -     POCT glucose (manual entry) -     Microalbumin/Creatinine Ratio, Urine -     POCT urinalysis dipstick -     Lipid Panel -     sitaGLIPtin-metformin (JANUMET) 50-1000 MG tablet; Take 1 tablet by mouth 2 (two) times daily with a meal.  Tinea pedis of both feet -     terbinafine (LAMISIL AT) 1 % cream; Apply 1 application topically 2 (two) times daily.  Eczema   I recommend the aqua glycolic line of skin products I also recommend daily sunscreen moisturizer like neutrogena daily defense  Diabetes blood sugar goals  Fasting (in AM before breakfast, 8 hrs of no eating or drinking (except water or unsweetened coffee or tea): 90-110 2 hrs after meals: < 160,   No low sugars: nothing < 70   F/u in 4 weeks with pharmacy team for blood sugar review F/u with me in 6 -8 weeks for pap smear  Dr. Armen Pickup  Generic Shoulder Exercises EXERCISES  RANGE OF MOTION (ROM) AND STRETCHING EXERCISES These exercises may help you when beginning to rehabilitate your injury. Your symptoms may resolve with or without further involvement from your physician, physical therapist or athletic trainer. While completing these exercises, remember:   Restoring tissue flexibility helps normal motion to return to the joints. This allows healthier, less painful movement and activity.  An effective stretch should be held for at least 30 seconds.  A stretch should never be  painful. You should only feel a gentle lengthening or release in the stretched tissue. ROM - Pendulum  Bend at the waist so that your right / left arm falls away from your body. Support yourself with your opposite hand on a solid surface, such as a table or a countertop.  Your right / left arm should be perpendicular to the ground. If it is not perpendicular, you need to lean over farther. Relax the muscles in your right / left arm and shoulder as much as possible.  Gently sway your hips and trunk so they move your right / left arm without any use of your right / left shoulder muscles.  Progress your movements so that your right / left arm moves side to side, then forward and backward, and finally, both clockwise and counterclockwise.  Complete __________ repetitions in each direction. Many people use this exercise to relieve discomfort in their shoulder as well as to gain range of motion. Repeat __________ times. Complete this exercise __________ times per day. STRETCH - Flexion, Standing  Stand with good posture. With an underhand grip on your right / left hand and an overhand grip on the opposite hand, grasp a broomstick or cane so that your hands are a little more than shoulder-width apart.  Keeping your right / left elbow straight and shoulder muscles relaxed, push the stick with your opposite hand to raise your right / left arm in  front of your body and then overhead. Raise your arm until you feel a stretch in your right / left shoulder, but before you have increased shoulder pain.  Try to avoid shrugging your right / left shoulder as your arm rises by keeping your shoulder blade tucked down and toward your mid-back spine. Hold __________ seconds.  Slowly return to the starting position. Repeat __________ times. Complete this exercise __________ times per day. STRETCH - Internal Rotation  Place your right / left hand behind your back, palm-up.  Throw a towel or belt over your  opposite shoulder. Grasp the towel/belt with your right / left hand.  While keeping an upright posture, gently pull up on the towel/belt until you feel a stretch in the front of your right / left shoulder.  Avoid shrugging your right / left shoulder as your arm rises by keeping your shoulder blade tucked down and toward your mid-back spine.  Hold __________. Release the stretch by lowering your opposite hand. Repeat __________ times. Complete this exercise __________ times per day. STRETCH - External Rotation and Abduction  Stagger your stance through a doorframe. It does not matter which foot is forward.  As instructed by your physician, physical therapist or athletic trainer, place your hands:  And forearms above your head and on the door frame.  And forearms at head-height and on the door frame.  At elbow-height and on the door frame.  Keeping your head and chest upright and your stomach muscles tight to prevent over-extending your low-back, slowly shift your weight onto your front foot until you feel a stretch across your chest and/or in the front of your shoulders.  Hold __________ seconds. Shift your weight to your back foot to release the stretch. Repeat __________ times. Complete this stretch __________ times per day.  STRENGTHENING EXERCISES  These exercises may help you when beginning to rehabilitate your injury. They may resolve your symptoms with or without further involvement from your physician, physical therapist or athletic trainer. While completing these exercises, remember:   Muscles can gain both the endurance and the strength needed for everyday activities through controlled exercises.  Complete these exercises as instructed by your physician, physical therapist or athletic trainer. Progress the resistance and repetitions only as guided.  You may experience muscle soreness or fatigue, but the pain or discomfort you are trying to eliminate should never worsen during  these exercises. If this pain does worsen, stop and make certain you are following the directions exactly. If the pain is still present after adjustments, discontinue the exercise until you can discuss the trouble with your clinician.  If advised by your physician, during your recovery, avoid activity or exercises which involve actions that place your right / left hand or elbow above your head or behind your back or head. These positions stress the tissues which are trying to heal. STRENGTH - Scapular Depression and Adduction  With good posture, sit on a firm chair. Supported your arms in front of you with pillows, arm rests or a table top. Have your elbows in line with the sides of your body.  Gently draw your shoulder blades down and toward your mid-back spine. Gradually increase the tension without tensing the muscles along the top of your shoulders and the back of your neck.  Hold for __________ seconds. Slowly release the tension and relax your muscles completely before completing the next repetition.  After you have practiced this exercise, remove the arm support and complete it in standing as  well as sitting. Repeat __________ times. Complete this exercise __________ times per day.  STRENGTH - External Rotators  Secure a rubber exercise band/tubing to a fixed object so that it is at the same height as your right / left elbow when you are standing or sitting on a firm surface.  Stand or sit so that the secured exercise band/tubing is at your side that is not injured.  Bend your elbow 90 degrees. Place a folded towel or small pillow under your right / left arm so that your elbow is a few inches away from your side.  Keeping the tension on the exercise band/tubing, pull it away from your body, as if pivoting on your elbow. Be sure to keep your body steady so that the movement is only coming from your shoulder rotating.  Hold __________ seconds. Release the tension in a controlled manner as  you return to the starting position. Repeat __________ times. Complete this exercise __________ times per day.  STRENGTH - Supraspinatus  Stand or sit with good posture. Grasp a __________ weight or an exercise band/tubing so that your hand is "thumbs-up," like when you shake hands.  Slowly lift your right / left hand from your thigh into the air, traveling about 30 degrees from straight out at your side. Lift your hand to shoulder height or as far as you can without increasing any shoulder pain. Initially, many people do not lift their hands above shoulder height.  Avoid shrugging your right / left shoulder as your arm rises by keeping your shoulder blade tucked down and toward your mid-back spine.  Hold for __________ seconds. Control the descent of your hand as you slowly return to your starting position. Repeat __________ times. Complete this exercise __________ times per day.  STRENGTH - Shoulder Extensors  Secure a rubber exercise band/tubing so that it is at the height of your shoulders when you are either standing or sitting on a firm arm-less chair.  With a thumbs-up grip, grasp an end of the band/tubing in each hand. Straighten your elbows and lift your hands straight in front of you at shoulder height. Step back away from the secured end of band/tubing until it becomes tense.  Squeezing your shoulder blades together, pull your hands down to the sides of your thighs. Do not allow your hands to go behind you.  Hold for __________ seconds. Slowly ease the tension on the band/tubing as you reverse the directions and return to the starting position. Repeat __________ times. Complete this exercise __________ times per day.  STRENGTH - Scapular Retractors  Secure a rubber exercise band/tubing so that it is at the height of your shoulders when you are either standing or sitting on a firm arm-less chair.  With a palm-down grip, grasp an end of the band/tubing in each hand. Straighten your  elbows and lift your hands straight in front of you at shoulder height. Step back away from the secured end of band/tubing until it becomes tense.  Squeezing your shoulder blades together, draw your elbows back as you bend them. Keep your upper arm lifted away from your body throughout the exercise.  Hold __________ seconds. Slowly ease the tension on the band/tubing as you reverse the directions and return to the starting position. Repeat __________ times. Complete this exercise __________ times per day. STRENGTH - Scapular Depressors  Find a sturdy chair without wheels, such as a from a dining room table.  Keeping your feet on the floor, lift your bottom from  the seat and lock your elbows.  Keeping your elbows straight, allow gravity to pull your body weight down. Your shoulders will rise toward your ears.  Raise your body against gravity by drawing your shoulder blades down your back, shortening the distance between your shoulders and ears. Although your feet should always maintain contact with the floor, your feet should progressively support less body weight as you get stronger.  Hold __________ seconds. In a controlled and slow manner, lower your body weight to begin the next repetition. Repeat __________ times. Complete this exercise __________ times per day.    This information is not intended to replace advice given to you by your health care provider. Make sure you discuss any questions you have with your health care provider.   Document Released: 10/19/2005 Document Revised: 12/26/2014 Document Reviewed: 03/19/2009 [employer] Interactive Patient Education [employer]

## 2015-09-29 NOTE — Progress Notes (Signed)
Subjective:  Patient ID: EDRIE EHRICH, female    DOB: Nov 12, 1966  Age: 49 y.o. MRN: [id number]  CC: Establish Care and Hospitalization Follow-up   HPI [name] presents for   1. CVA: in 2015. With R arm weakness and hand contracture. Also with expressive aphasia that is mild. She is R handed. She stopped working after her stroke. She has family planning medicaid only.   2. HTN: dx in 2015. Taking losartan. Had cough with ACE i. Out of meds x 3 days. No CP, edema or SOB.  3. DM2: dx with gestational diabetes in 2009. Diagnosed with chronic diabetes in 2013. Was on victoza. Now taking janumet. Not monitoring sugars. Reports polydipsia. Denies polyuria, neuropathy, blurred vision.   4. L anterior shoulder pain: since 12/2014. Favoring her L side since CVA. No injury. Pain with raising L arm like when driving. Pain improved with massage and hot showers.   Past Medical History  Diagnosis Date  . Hypertension 09/07/2014  . Stroke ([place]) 09/07/2014    R side. weakness in upper arm. No deficit in leg. deficit in speech. A littel bit in coordination.   . Diabetes mellitus without complication ([place]) 2924    previously on [place]   . Gestational diabetes 2009     Past Surgical History  Procedure Laterality Date  . Breast surgery    . Cesarean section    . Tubal ligation  2009    Family History  Problem Relation Age of Onset  . Diabetes Mother   . Hypertension Mother   . Hypertension Sister     Social History  Substance Use Topics  . Smoking status: Never Smoker   . Smokeless tobacco: Never Used  . Alcohol Use: No    ROS Review of Systems  Constitutional: Positive for appetite change. Negative for fever and chills.  Eyes: Negative for visual disturbance.  Respiratory: Negative for shortness of breath.   Cardiovascular: Negative for chest pain.  Gastrointestinal: Negative for abdominal pain and blood in stool.  Endocrine: Positive for polydipsia. Negative for  polyphagia and polyuria.  Musculoskeletal: Positive for arthralgias (L anterior shoulder ). Negative for back pain.  Skin: Positive for rash (has facial discoloration for long history of asthma ).  Allergic/Immunologic: Negative for immunocompromised state.  Neurological: Positive for speech difficulty, weakness (LUE ) and headaches. Negative for dizziness, tremors, seizures, syncope, facial asymmetry, light-headedness and numbness.  Hematological: Negative for adenopathy. Does not bruise/bleed easily.  Psychiatric/Behavioral: Negative for suicidal ideas and dysphoric mood.  GAD-7: score of 8. 1-1,2,3,5. 2-4,6. 0-7.   Objective:   Today's Vitals: BP 150/85 mmHg  Pulse 89  Temp(Src) 98.8 F (37.1 C) (Oral)  Resp 16  Ht _0  (1.499 m)  Wt 193 lb (87.544 kg)  BMI 38.96 kg/m2  SpO2 99%  LMP 09/16/2015  Physical Exam  Constitutional: She is oriented to person, place, and time. She appears well-developed and well-nourished. No distress.  HENT:  Head: Normocephalic and atraumatic.  Cardiovascular: Normal rate, regular rhythm, normal heart sounds and intact distal pulses.   Pulmonary/Chest: Effort normal and breath sounds normal.  Musculoskeletal: She exhibits no edema.       Right shoulder: She exhibits normal range of motion.       Left shoulder: She exhibits pain (L anterior shoulder ). She exhibits normal range of motion, no tenderness, no bony tenderness, no swelling, no effusion, no crepitus, no deformity, no laceration, no spasm, normal pulse and normal strength.  Neurological: She  is alert and oriented to person, place, and time. She exhibits abnormal muscle tone. Coordination normal.  Increased tone in L upper arm with lack of movement in hand, elbow and shoulder.   Slight expressive aphasia with slowed speech.   Skin: Skin is warm and dry. No rash noted.  Maceration between toes  Dry skin on plantar aspect of feet   Psychiatric: She has a normal mood and affect.   Lab  Results  Component Value Date   HGBA1C 11.50 09/29/2015    CBG 265  Assessment & Plan:   No problem-specific assessment & plan notes found for this encounter.   Outpatient Encounter Prescriptions as of 09/29/2015  Medication Sig  . losartan (COZAAR) 25 MG tablet Take 1 tablet (25 mg total) by mouth daily.  . [DISCONTINUED] losartan (COZAAR) 25 MG tablet Take 25 mg by mouth daily.  . [DISCONTINUED] sitaGLIPtin (JANUVIA) 50 MG tablet Take 50 mg by mouth daily.  . [DISCONTINUED] sitaGLIPtin-metformin (JANUMET) 50-500 MG tablet Take 1 tablet by mouth daily.  . Blood Glucose Monitoring Suppl (TRUE METRIX METER) W/DEVICE KIT 1 each by Does not apply route as needed.  Marland Kitchen glucose blood (TRUE METRIX BLOOD GLUCOSE TEST) test strip 1 each by Other route 3 (three) times daily.  . sitaGLIPtin-metformin (JANUMET) 50-1000 MG tablet Take 1 tablet by mouth 2 (two) times daily with a meal.  . terbinafine (LAMISIL AT) 1 % cream Apply 1 application topically 2 (two) times daily.  . TRUEPLUS LANCETS 28G MISC 1 each by Does not apply route 3 (three) times daily.  . [DISCONTINUED] ACCU-CHEK SOFTCLIX LANCETS lancets 1 each by Other route 3 (three) times daily.  . [DISCONTINUED] Blood Glucose Monitoring Suppl (ACCU-CHEK AVIVA PLUS) W/DEVICE KIT 1 Device by Does not apply route 3 (three) times daily after meals.  . [DISCONTINUED] glucose blood (ACCU-CHEK AVIVA PLUS) test strip 1 each by Other route 3 (three) times daily.   No facility-administered encounter medications on file as of 09/29/2015.    Follow-up: No Follow-up on file.    Boykin Nearing MD

## 2015-09-29 NOTE — Progress Notes (Signed)
Establish Care, Medicine refills  HFU Stroke Requesting PT referral  Stated has constant pain on lt arm  Pain scale #6 No tobacco Hx

## 2015-09-30 LAB — MICROALBUMIN / CREATININE URINE RATIO
Creatinine, Urine: 91.6 mg/dL
MICROALB UR: 0.8 mg/dL (ref <2.0)
Microalb Creat Ratio: 8.7 mg/g (ref 0.0–30.0)

## 2015-09-30 MED ORDER — FERROUS SULFATE 325 (65 FE) MG PO TABS: 325.0000 mg | ORAL_TABLET | Freq: Two times a day (BID) | ORAL | Status: DC

## 2015-09-30 MED ORDER — ATORVASTATIN CALCIUM 40 MG PO TABS: 40.0000 mg | ORAL_TABLET | Freq: Every day | ORAL | Status: DC

## 2015-09-30 MED ORDER — TERBINAFINE HCL 250 MG PO TABS: 250.0000 mg | ORAL_TABLET | Freq: Every day | ORAL | Status: DC

## 2015-09-30 NOTE — Addendum Note (Signed)
Addended by: Dessa PhiFUNCHES, Lerae Langham on: 09/30/2015 08:37 AM   Modules accepted: Orders

## 2015-11-11 ENCOUNTER — Ambulatory Visit: Payer: Medicaid Other

## 2016-03-02 DIAGNOSIS — I6930 Unspecified sequelae of cerebral infarction: Secondary | ICD-10-CM | POA: Insufficient documentation

## 2016-04-19 ENCOUNTER — Emergency Department (HOSPITAL_COMMUNITY): Payer: Self-pay

## 2016-04-19 ENCOUNTER — Emergency Department (HOSPITAL_COMMUNITY)
Admission: EM | Admit: 2016-04-19 | Discharge: 2016-04-20 | Disposition: A | Discharge status: Home / Self Care | Payer: Self-pay | Attending: Emergency Medicine | Admitting: Emergency Medicine

## 2016-04-19 ENCOUNTER — Encounter (HOSPITAL_COMMUNITY): Payer: Self-pay

## 2016-04-19 DIAGNOSIS — Z79899 Other long term (current) drug therapy: Secondary | ICD-10-CM | POA: Insufficient documentation

## 2016-04-19 DIAGNOSIS — Z7984 Long term (current) use of oral hypoglycemic drugs: Secondary | ICD-10-CM | POA: Insufficient documentation

## 2016-04-19 DIAGNOSIS — M542 Cervicalgia: Secondary | ICD-10-CM | POA: Insufficient documentation

## 2016-04-19 DIAGNOSIS — F419 Anxiety disorder, unspecified: Secondary | ICD-10-CM | POA: Insufficient documentation

## 2016-04-19 DIAGNOSIS — M25512 Pain in left shoulder: Secondary | ICD-10-CM | POA: Insufficient documentation

## 2016-04-19 DIAGNOSIS — E119 Type 2 diabetes mellitus without complications: Secondary | ICD-10-CM | POA: Insufficient documentation

## 2016-04-19 DIAGNOSIS — R202 Paresthesia of skin: Secondary | ICD-10-CM | POA: Insufficient documentation

## 2016-04-19 DIAGNOSIS — R51 Headache: Secondary | ICD-10-CM | POA: Insufficient documentation

## 2016-04-19 DIAGNOSIS — I10 Essential (primary) hypertension: Secondary | ICD-10-CM | POA: Insufficient documentation

## 2016-04-19 DIAGNOSIS — R519 Headache, unspecified: Secondary | ICD-10-CM

## 2016-04-19 DIAGNOSIS — Z8632 Personal history of gestational diabetes: Secondary | ICD-10-CM | POA: Insufficient documentation

## 2016-04-19 DIAGNOSIS — R200 Anesthesia of skin: Secondary | ICD-10-CM | POA: Insufficient documentation

## 2016-04-19 DIAGNOSIS — H53149 Visual discomfort, unspecified: Secondary | ICD-10-CM | POA: Insufficient documentation

## 2016-04-19 DIAGNOSIS — Z8673 Personal history of transient ischemic attack (TIA), and cerebral infarction without residual deficits: Secondary | ICD-10-CM | POA: Insufficient documentation

## 2016-04-19 LAB — CBC WITH DIFFERENTIAL/PLATELET
BASOS PCT: 0 %
Basophils Absolute: 0 10*3/uL (ref 0.0–0.1)
EOS PCT: 1 %
Eosinophils Absolute: 0.1 10*3/uL (ref 0.0–0.7)
HCT: 27.8 % — ABNORMAL LOW (ref 36.0–46.0)
Hemoglobin: 8.8 g/dL — ABNORMAL LOW (ref 12.0–15.0)
LYMPHS ABS: 3 10*3/uL (ref 0.7–4.0)
Lymphocytes Relative: 37 %
MCH: 20.4 pg — AB (ref 26.0–34.0)
MCHC: 31.7 g/dL (ref 30.0–36.0)
MCV: 64.4 fL — AB (ref 78.0–100.0)
MONO ABS: 0.5 10*3/uL (ref 0.1–1.0)
Monocytes Relative: 6 %
NEUTROS ABS: 4.4 10*3/uL (ref 1.7–7.7)
Neutrophils Relative %: 56 %
PLATELETS: 403 10*3/uL — AB (ref 150–400)
RBC: 4.32 MIL/uL (ref 3.87–5.11)
RDW: 17.6 % — AB (ref 11.5–15.5)
WBC: 8 10*3/uL (ref 4.0–10.5)

## 2016-04-19 LAB — COMPREHENSIVE METABOLIC PANEL
ALT: 15 U/L (ref 14–54)
AST: 23 U/L (ref 15–41)
Albumin: 4.1 g/dL (ref 3.5–5.0)
Alkaline Phosphatase: 67 U/L (ref 38–126)
Anion gap: 8 (ref 5–15)
BILIRUBIN TOTAL: 0.3 mg/dL (ref 0.3–1.2)
BUN: 9 mg/dL (ref 6–20)
CHLORIDE: 103 mmol/L (ref 101–111)
CO2: 25 mmol/L (ref 22–32)
CREATININE: 0.65 mg/dL (ref 0.44–1.00)
Calcium: 9.6 mg/dL (ref 8.9–10.3)
Glucose, Bld: 213 mg/dL — ABNORMAL HIGH (ref 65–99)
POTASSIUM: 3.8 mmol/L (ref 3.5–5.1)
Sodium: 136 mmol/L (ref 135–145)
TOTAL PROTEIN: 7.6 g/dL (ref 6.5–8.1)

## 2016-04-19 LAB — URINALYSIS, ROUTINE W REFLEX MICROSCOPIC
BILIRUBIN URINE: NEGATIVE
Glucose, UA: 500 mg/dL — AB
Hgb urine dipstick: NEGATIVE
Ketones, ur: NEGATIVE mg/dL
Leukocytes, UA: NEGATIVE
NITRITE: NEGATIVE
PROTEIN: NEGATIVE mg/dL
SPECIFIC GRAVITY, URINE: 1.012 (ref 1.005–1.030)
pH: 6 (ref 5.0–8.0)

## 2016-04-19 LAB — RAPID URINE DRUG SCREEN, HOSP PERFORMED
Amphetamines: NOT DETECTED
BENZODIAZEPINES: NOT DETECTED
Barbiturates: NOT DETECTED
Cocaine: NOT DETECTED
Opiates: NOT DETECTED
Tetrahydrocannabinol: NOT DETECTED

## 2016-04-19 LAB — I-STAT TROPONIN, ED: TROPONIN I, POC: 0 ng/mL (ref 0.00–0.08)

## 2016-04-19 LAB — ETHANOL

## 2016-04-19 MED ORDER — METHYLPREDNISOLONE SODIUM SUCC 125 MG IJ SOLR
125.0000 mg | Freq: Once | INTRAMUSCULAR | Status: AC
  Administered 2016-04-19: 125 mg via INTRAVENOUS
  Filled 2016-04-19: qty 2

## 2016-04-19 MED ORDER — DIPHENHYDRAMINE HCL 50 MG/ML IJ SOLN
25.0000 mg | Freq: Once | INTRAMUSCULAR | Status: AC
  Administered 2016-04-19: 25 mg via INTRAVENOUS
  Filled 2016-04-19: qty 1

## 2016-04-19 MED ORDER — KETOROLAC TROMETHAMINE 30 MG/ML IJ SOLN
30.0000 mg | Freq: Once | INTRAMUSCULAR | Status: AC
  Administered 2016-04-19: 30 mg via INTRAVENOUS
  Filled 2016-04-19: qty 1

## 2016-04-19 MED ORDER — METOCLOPRAMIDE HCL 5 MG/ML IJ SOLN
10.0000 mg | Freq: Once | INTRAMUSCULAR | Status: AC
  Administered 2016-04-19: 10 mg via INTRAVENOUS
  Filled 2016-04-19: qty 2

## 2016-04-19 NOTE — ED Notes (Signed)
Patient transported to CT 

## 2016-04-19 NOTE — ED Provider Notes (Signed)
CSN: [id number]     Arrival date & time 04/19/16  1847 History   First MD Initiated Contact with Patient 04/19/16 1854     Chief Complaint  Patient presents with  . Arm numbness   . Headache     (Consider location/radiation/quality/duration/timing/severity/associated sxs/prior Treatment) HPI Patient presents with one month of left-sided headache. Described as constant and worse with palpation. Gradual onset. Patient endorses photophobia. She's had one week of left hand numbness and paresthesias. She does not remember exactly when this started. She denies any new weakness. Patient does have residual right arm weakness from previous stroke. Denies any recent trauma or falls. Patient states she's had increased stress recently Past Medical History  Diagnosis Date  . Hypertension 09/07/2014  . Stroke ([place]) 09/07/2014    R side. weakness in upper arm. No deficit in leg. deficit in speech. A littel bit in coordination.   . Diabetes mellitus without complication ([place]) 3244    previously on [place]   . Gestational diabetes 2009    Past Surgical History  Procedure Laterality Date  . Breast surgery    . Cesarean section    . Tubal ligation  2009   Family History  Problem Relation Age of Onset  . Diabetes Mother   . Hypertension Mother   . Hypertension Sister    Social History  Substance Use Topics  . Smoking status: Never Smoker   . Smokeless tobacco: Never Used  . Alcohol Use: No   OB History    No data available     Review of Systems  Constitutional: Negative for fever and chills.  HENT: Negative for congestion, facial swelling, sinus pressure and sore throat.   Eyes: Positive for photophobia. Negative for visual disturbance.  Respiratory: Negative for cough and shortness of breath.   Cardiovascular: Negative for chest pain.  Gastrointestinal: Negative for nausea, vomiting, abdominal pain, diarrhea and constipation.  Musculoskeletal: Positive for myalgias and neck pain.  Negative for back pain and neck stiffness.  Skin: Negative for rash and wound.  Neurological: Positive for numbness and headaches. Negative for dizziness, syncope, speech difficulty, weakness and light-headedness.  Psychiatric/Behavioral: The patient is nervous/anxious.   All other systems reviewed and are negative.     Allergies  Review of patient's allergies indicates no known allergies.  Home Medications   Prior to Admission medications   Medication Sig Start Date End Date Taking? Authorizing Provider  losartan (COZAAR) 25 MG tablet Take 1 tablet (25 mg total) by mouth daily. 09/29/15  Yes Josalyn Funches, MD  sitaGLIPtin-metformin (JANUMET) 50-1000 MG tablet Take 1 tablet by mouth 2 (two) times daily with a meal. Patient taking differently: Take 1 tablet by mouth daily.  09/29/15  Yes Josalyn Funches, MD  atorvastatin (LIPITOR) 40 MG tablet Take 1 tablet (40 mg total) by mouth daily. Patient not taking: Reported on 04/19/2016 09/30/15   Boykin Nearing, MD  Blood Glucose Monitoring Suppl (TRUE METRIX METER) W/DEVICE KIT 1 each by Does not apply route as needed. 09/29/15   Josalyn Funches, MD  ferrous sulfate 325 (65 FE) MG tablet Take 1 tablet (325 mg total) by mouth 2 (two) times daily with a meal. Patient not taking: Reported on 04/19/2016 09/30/15   Josalyn Funches, MD  glucose blood (TRUE METRIX BLOOD GLUCOSE TEST) test strip 1 each by Other route 3 (three) times daily. 09/29/15   Josalyn Funches, MD  ibuprofen (ADVIL,MOTRIN) 600 MG tablet Take 1 tablet (600 mg total) by mouth every 6 (six) hours as  needed. 04/20/16   Julianne Rice, MD  metoCLOPramide (REGLAN) 10 MG tablet Take 1 tablet (10 mg total) by mouth every 6 (six) hours as needed for nausea (headache). 04/20/16   Julianne Rice, MD  TRUEPLUS LANCETS 28G MISC 1 each by Does not apply route 3 (three) times daily. 09/29/15   Josalyn Funches, MD   BP 152/78 mmHg  Pulse 91  Temp(Src) 98.6 F (37 C) (Oral)  Resp 18  SpO2 100%   LMP 04/02/2016 (Approximate) Physical Exam  Constitutional: She is oriented to person, place, and time. She appears well-developed and well-nourished.  Tearful at times  HENT:  Head: Normocephalic and atraumatic.  Mouth/Throat: Oropharynx is clear and moist. No oropharyngeal exudate.  Patient with tenderness to palpation over the temporalis muscle on the left as well occipitalis and left sided paracervical muscles. No temporal artery tenderness, erythema or warmth. No sinus tenderness to percussion.  Eyes: EOM are normal. Pupils are equal, round, and reactive to light.  Pupils 3 mm and reactive bilaterally. Difficult to visualize optic disks though no obvious papilledema.   Neck: Normal range of motion. Neck supple.  No meningismus. No midline cervical tenderness. Patient has left-sided paracervical and left trapezius tenderness to palpation.  Cardiovascular: Normal rate and regular rhythm.  Exam reveals no gallop and no friction rub.   No murmur heard. Pulmonary/Chest: Effort normal and breath sounds normal. No respiratory distress. She has no wheezes. She has no rales. She exhibits no tenderness.  Abdominal: Soft. Bowel sounds are normal. She exhibits no distension and no mass. There is no tenderness. There is no rebound and no guarding.  Musculoskeletal: Normal range of motion. She exhibits no edema or tenderness.  Exacerbation of paresthesias with percussion of the volar surface of the left wrist. No wrist swelling, elbow or shoulder swelling on the left. Distal pulses are equal and intact. No lower extremity swelling or asymmetry.  Lymphadenopathy:    She has no cervical adenopathy.  Neurological: She is alert and oriented to person, place, and time.  4/5 motor the right upper extremity. Chronic per patient. 5/5 motor in lower extremity. 5/5 motor in left upper and left lower extremities. Sensation is mildly decreased to the distal tips of the second through fifth digits, mostly on the  volar surface. Cranial nerves II through XII intact. Bilateral finger-to-nose is intact.  Skin: Skin is warm and dry. No rash noted. No erythema.  Psychiatric:  Appears anxious  Nursing note and vitals reviewed.   ED Course  Procedures (including critical care time) Labs Review Labs Reviewed  CBC WITH DIFFERENTIAL/PLATELET - Abnormal; Notable for the following:    Hemoglobin 8.8 (*)    HCT 27.8 (*)    MCV 64.4 (*)    MCH 20.4 (*)    RDW 17.6 (*)    Platelets 403 (*)    All other components within normal limits  COMPREHENSIVE METABOLIC PANEL - Abnormal; Notable for the following:    Glucose, Bld 213 (*)    All other components within normal limits  URINALYSIS, ROUTINE W REFLEX MICROSCOPIC (NOT AT [facility]) - Abnormal; Notable for the following:    Glucose, UA 500 (*)    All other components within normal limits  URINE RAPID DRUG SCREEN, HOSP PERFORMED  ETHANOL  I-STAT TROPOININ, ED    Imaging Review Ct Head Wo Contrast  04/19/2016  CLINICAL DATA:  Headache, hand numbness EXAM: CT HEAD WITHOUT CONTRAST TECHNIQUE: Contiguous axial images were obtained from the base of the skull through  the vertex without intravenous contrast. COMPARISON:  None. FINDINGS: There is no evidence of mass effect, midline shift or extra-axial fluid collections. There is no evidence of a space-occupying lesion or intracranial hemorrhage. There is no evidence of a cortical-based area of acute infarction. There is an old left MCA territory infarct with encephalomalacia. There is an old left basal ganglia lacunar infarct. The ventricles and sulci are appropriate for the patient's age. The basal cisterns are patent. Visualized portions of the orbits are unremarkable. The visualized portions of the paranasal sinuses and mastoid air cells are unremarkable. The osseous structures are unremarkable. IMPRESSION: No acute intracranial pathology. Electronically Signed   By: Kathreen Devoid   On: 04/19/2016 19:52   Mr Brain Wo  Contrast  04/19/2016  CLINICAL DATA:  Initial evaluation for acute left arm and hand numbness with weakness. EXAM: MRI HEAD WITHOUT CONTRAST TECHNIQUE: Multiplanar, multiecho pulse sequences of the brain and surrounding structures were obtained without intravenous contrast. COMPARISON:  Prior CT from earlier the same day. FINDINGS: Overall cerebral volume within normal limits for patient age. Multi focal areas of encephalomalacia with gliosis involve the left frontal and parietal lobes, consistent with remote left MCA territory infarct. Associated remote lacunar infarctions within the left holiday. There is scattered areas of laminar necrosis within these regions. 9 mm diffusion abnormality within the left frontal lobe with an an area of encephalomalacia favored to be related to laminar necrosis rather than true acute infarction (Series 4, image 34). No other abnormal foci of restricted diffusion to suggest acute infarct. Gray-white matter differentiation otherwise maintained. Major intracranial vascular flow voids are preserved. No acute intracranial hemorrhage. No areas of chronic hemorrhage within the brain. No mass lesion, mass effect, or midline shift. No hydrocephalus. Slight asymmetry within the lateral ventricles, with the right smaller than the left noted, of doubtful clinical significance. No extra-axial fluid collection. Major dural sinuses are grossly patent. Craniocervical junction within normal limits. Visualized upper cervical spine unremarkable. Incidental note made of an empty sella, of doubtful clinical significance. No acute abnormality about the orbits. Question of minimal flattening at the posterior aspect of the right lobe (series 6, image 10). Minimally increased CSF fluid density along the optic nerve sheaths. Paranasal sinuses are clear. No mastoid effusion. Inner ear structures normal. Bone marrow signal intensity within normal limits. No scalp soft tissue abnormality. IMPRESSION: 1. No  acute intracranial infarct or other process identified. 2. Sequela of remote left MCA territory infarction. 9 mm diffusion signal abnormality within the left frontal lobe felt to be related to encephalomalacia/laminar necrosis. 3. Empty sella, with question of subtle flattening of the posterior aspect of the right globe. While these findings may be incidental and of no clinical significance, this may reflect the sequelae of underlying intracranial hypertension. Further workup and correlation of CSF pressures could be performed as clinically indicated. Electronically Signed   By: Jeannine Boga M.D.   On: 04/19/2016 22:37   I have personally reviewed and evaluated these images and lab results as part of my medical decision-making.   EKG Interpretation None      MDM   Final diagnoses:  Acute nonintractable headache, unspecified headache type  Numbness of left hand   Discussed with Dr. Leonel Ramsay. The low suspicion for stroke given previous history, suggested MRI brain.   Patient is now more calm. Resting comfortably. Discussed MRI results with Dr. Leonel Ramsay. Recommend follow-up with ophthalmology or neurology.  Patient states her headache is significantly improved after meds. Discussed need to  follow-up with neurology. Return precautions given.  Julianne Rice, MD 04/20/16 0030

## 2016-04-19 NOTE — ED Notes (Signed)
Pt presents with c/o left arm numbness and headache. Pt reports she has had a stroke in the past on the right side. Pt now c/o left arm numbness and reports she is having a bad headache. Pt is tearful and anxious at this time.

## 2016-04-19 NOTE — ED Notes (Signed)
MD at bedside. 

## 2016-04-20 MED ORDER — METOCLOPRAMIDE HCL 10 MG PO TABS: 10.0000 mg | ORAL_TABLET | Freq: Four times a day (QID) | ORAL | Status: DC | PRN

## 2016-04-20 MED ORDER — IBUPROFEN 600 MG PO TABS
600.0000 mg | ORAL_TABLET | Freq: Four times a day (QID) | ORAL | Status: DC | PRN
Start: 2016-04-20 — End: 2016-10-13

## 2016-04-20 NOTE — Discharge Instructions (Signed)
Migraine Headache A migraine headache is an intense, throbbing pain on one or both sides of your head. A migraine can last for 30 minutes to several hours. CAUSES  The exact cause of a migraine headache is not always known. However, a migraine may be caused when nerves in the brain become irritated and release chemicals that cause inflammation. This causes pain. Certain things may also trigger migraines, such as:  Alcohol.  Smoking.  Stress.  Menstruation.  Aged cheeses.  Foods or drinks that contain nitrates, glutamate, aspartame, or tyramine.  Lack of sleep.  Chocolate.  Caffeine.  Hunger.  Physical exertion.  Fatigue.  Medicines used to treat chest pain (nitroglycerine), birth control pills, estrogen, and some blood pressure medicines. SIGNS AND SYMPTOMS  Pain on one or both sides of your head.  Pulsating or throbbing pain.  Severe pain that prevents daily activities.  Pain that is aggravated by any physical activity.  Nausea, vomiting, or both.  Dizziness.  Pain with exposure to bright lights, loud noises, or activity.  General sensitivity to bright lights, loud noises, or smells. Before you get a migraine, you may get warning signs that a migraine is coming (aura). An aura may include:  Seeing flashing lights.  Seeing bright spots, halos, or zigzag lines.  Having tunnel vision or blurred vision.  Having feelings of numbness or tingling.  Having trouble talking.  Having muscle weakness. DIAGNOSIS  A migraine headache is often diagnosed based on:  Symptoms.  Physical exam.  A CT scan or MRI of your head. These imaging tests cannot diagnose migraines, but they can help rule out other causes of headaches. TREATMENT Medicines may be given for pain and nausea. Medicines can also be given to help prevent recurrent migraines.  HOME CARE INSTRUCTIONS  Only take over-the-counter or prescription medicines for pain or discomfort as directed by your  health care provider. The use of long-term narcotics is not recommended.  Lie down in a dark, quiet room when you have a migraine.  Keep a journal to find out what may trigger your migraine headaches. For example, write down:  What you eat and drink.  How much sleep you get.  Any change to your diet or medicines.  Limit alcohol consumption.  Quit smoking if you smoke.  Get 7-9 hours of sleep, or as recommended by your health care provider.  Limit stress.  Keep lights dim if bright lights bother you and make your migraines worse. SEEK IMMEDIATE MEDICAL CARE IF:   Your migraine becomes severe.  You have a fever.  You have a stiff neck.  You have vision loss.  You have muscular weakness or loss of muscle control.  You start losing your balance or have trouble walking.  You feel faint or pass out.  You have severe symptoms that are different from your first symptoms. MAKE SURE YOU:   Understand these instructions.  Will watch your condition.  Will get help right away if you are not doing well or get worse.   This information is not intended to replace advice given to you by your health care provider. Make sure you discuss any questions you have with your health care provider.   Document Released: 12/05/2005 Document Revised: 12/26/2014 Document Reviewed: 08/12/2013 Elsevier Interactive Patient Education 2016 Elsevier Inc.     Paresthesia Paresthesia is an abnormal burning or prickling sensation. This sensation is generally felt in the hands, arms, legs, or feet. However, it may occur in any part of the body.  Usually, it is not painful. The feeling may be described as:  Tingling or numbness.  Pins and needles.  Skin crawling.  Buzzing.  Limbs falling asleep.  Itching. Most people experience temporary (transient) paresthesia at some time in their lives. Paresthesia may occur when you breathe too quickly (hyperventilation). It can also occur without any  apparent cause. Commonly, paresthesia occurs when pressure is placed on a nerve. The sensation quickly goes away after the pressure is removed. For some people, however, paresthesia is a long-lasting (chronic) condition that is caused by an underlying disorder. If you continue to have paresthesia, you may need further medical evaluation. HOME CARE INSTRUCTIONS Watch your condition for any changes. Taking the following actions may help to lessen any discomfort that you are feeling:  Avoid drinking alcohol.  Try acupuncture or massage to help relieve your symptoms.  Keep all follow-up visits as directed by your health care provider. This is important. SEEK MEDICAL CARE IF:  You continue to have episodes of paresthesia.  Your burning or prickling feeling gets worse when you walk.  You have pain, cramps, or dizziness.  You develop a rash. SEEK IMMEDIATE MEDICAL CARE IF:  You feel weak.  You have trouble walking or moving.  You have problems with speech, understanding, or vision.  You feel confused.  You cannot control your bladder or bowel movements.  You have numbness after an injury.  You faint.   This information is not intended to replace advice given to you by your health care provider. Make sure you discuss any questions you have with your health care provider.   Document Released: 11/25/2002 Document Revised: 04/21/2015 Document Reviewed: 12/01/2014 Elsevier Interactive Patient Education [employer]2016 [employer] Inc[employer]

## 2016-04-20 NOTE — ED Notes (Signed)
Patient was alert, oriented and stable upon discharge. RN went over AVS and patient had no further questions.  

## 2016-10-13 ENCOUNTER — Encounter: Payer: Self-pay | Admitting: Family Medicine

## 2016-10-13 ENCOUNTER — Ambulatory Visit: Payer: Medicaid Other | Attending: Family Medicine | Admitting: Family Medicine

## 2016-10-13 VITALS — BP 157/86 | HR 74 | Temp 98.2°F | Ht 59.0 in | Wt 186.4 lb

## 2016-10-13 DIAGNOSIS — Z9851 Tubal ligation status: Secondary | ICD-10-CM | POA: Insufficient documentation

## 2016-10-13 DIAGNOSIS — Z7984 Long term (current) use of oral hypoglycemic drugs: Secondary | ICD-10-CM | POA: Insufficient documentation

## 2016-10-13 DIAGNOSIS — IMO0002 Reserved for concepts with insufficient information to code with codable children: Secondary | ICD-10-CM

## 2016-10-13 DIAGNOSIS — I10 Essential (primary) hypertension: Secondary | ICD-10-CM

## 2016-10-13 DIAGNOSIS — Z8673 Personal history of transient ischemic attack (TIA), and cerebral infarction without residual deficits: Secondary | ICD-10-CM | POA: Insufficient documentation

## 2016-10-13 DIAGNOSIS — I6300 Cerebral infarction due to thrombosis of unspecified precerebral artery: Secondary | ICD-10-CM

## 2016-10-13 DIAGNOSIS — E118 Type 2 diabetes mellitus with unspecified complications: Secondary | ICD-10-CM

## 2016-10-13 DIAGNOSIS — Z9889 Other specified postprocedural states: Secondary | ICD-10-CM | POA: Insufficient documentation

## 2016-10-13 DIAGNOSIS — B353 Tinea pedis: Secondary | ICD-10-CM | POA: Insufficient documentation

## 2016-10-13 DIAGNOSIS — E1165 Type 2 diabetes mellitus with hyperglycemia: Secondary | ICD-10-CM | POA: Insufficient documentation

## 2016-10-13 LAB — POCT GLYCOSYLATED HEMOGLOBIN (HGB A1C): Hemoglobin A1C: 10.4

## 2016-10-13 LAB — GLUCOSE, POCT (MANUAL RESULT ENTRY): POC GLUCOSE: 206 mg/dL — AB (ref 70–99)

## 2016-10-13 MED ORDER — ASPIRIN EC 81 MG PO TBEC: 81.0000 mg | DELAYED_RELEASE_TABLET | Freq: Every day | ORAL | 11 refills | Status: DC

## 2016-10-13 MED ORDER — LOSARTAN POTASSIUM 50 MG PO TABS: 50.0000 mg | ORAL_TABLET | Freq: Every day | ORAL | 5 refills | Status: DC

## 2016-10-13 MED ORDER — ATORVASTATIN CALCIUM 40 MG PO TABS: 40.0000 mg | ORAL_TABLET | Freq: Every day | ORAL | 11 refills | Status: DC

## 2016-10-13 MED ORDER — GLUCOSE BLOOD VI STRP: 1.0000 | ORAL_STRIP | Freq: Two times a day (BID) | 11 refills | Status: AC

## 2016-10-13 MED ORDER — TRUE METRIX METER W/DEVICE KIT: 1.0000 | PACK | 0 refills | Status: AC | PRN

## 2016-10-13 MED ORDER — SITAGLIPTIN PHOS-METFORMIN HCL 50-1000 MG PO TABS: 1.0000 | ORAL_TABLET | Freq: Two times a day (BID) | ORAL | 5 refills | Status: DC

## 2016-10-13 MED ORDER — TRUEPLUS LANCETS 28G MISC: 1.0000 | Freq: Two times a day (BID) | 11 refills | Status: AC

## 2016-10-13 MED ORDER — KETOCONAZOLE 2 % EX CREA: 1.0000 "application " | TOPICAL_CREAM | Freq: Two times a day (BID) | CUTANEOUS | 0 refills | Status: DC

## 2016-10-13 MED FILL — **JANUMET 50-1000 MG TABLET: 50-1000 | 30 days supply | Qty: 60 | Fill #0

## 2016-10-13 MED FILL — KETOCONAZOLE 2% CREAM: 2 | 7 days supply | Qty: 15 | Fill #0

## 2016-10-13 MED FILL — TRUEplus LANCETS 28G MISC: 50 days supply | Qty: 100 | Fill #0

## 2016-10-13 MED FILL — ATORVASTATIN 40 MG TABLET: 40 | 30 days supply | Qty: 30 | Fill #0

## 2016-10-13 MED FILL — TRUE METRIX BLOOD GLUCOSE M: W/DEVICE | 1 days supply | Qty: 1 | Fill #0

## 2016-10-13 MED FILL — LOSARTAN POTASSIUM 50 MG TA: 50 | 30 days supply | Qty: 30 | Fill #0

## 2016-10-13 MED FILL — TRUE METRIX TEST STRIP: 50 days supply | Qty: 100 | Fill #0

## 2016-10-13 NOTE — Assessment & Plan Note (Signed)
>>  ASSESSMENT AND PLAN FOR CVA (CEREBRAL VASCULAR ACCIDENT) ([facility]) WRITTEN ON 10/13/2016  9:58 AM BY Dessa PhiFUNCHES, JOSALYN, MD  A: CVA in 2015 P: Add statin and aspirin for secondary prevention of stroke

## 2016-10-13 NOTE — Progress Notes (Signed)
Subjective:  Patient ID: [name], female    DOB: 1966/03/22  Age: 50 y.o. MRN: [id number]  CC: Diabetes   HPI [name] presents for   1. CVA: in 2015. With R arm weakness and hand contracture. Also with expressive aphasia that is mild. She is R handed. She stopped working after her stroke. She is able to cook, home school her 98 yo son and do most things for herself. She denies new weakness, headache or numbness. She is not taking statin or aspirin.   2. HTN: dx in 2015. Taking losartan 25 mg daily. None today. Had cough with ACE inhibitors. She is a vegan. She eats low salt. She exercise for 1 hr daily.   3. DM2: dx with gestational diabetes in 2009. Diagnosed with chronic diabetes in 2013.  Taking janumet once daily.  Not monitoring sugars. . Denies polydipsia,  polyuria, neuropathy, blurred vision.    Past Medical History:  Diagnosis Date  . Diabetes mellitus without complication ([place]) 6294   previously on [facility]   . Gestational diabetes 2009   . Hypertension 09/07/2014  . Stroke ([place]) 09/07/2014   R side. weakness in upper arm. No deficit in leg. deficit in speech. A littel bit in coordination.     Past Surgical History:  Procedure Laterality Date  . BREAST SURGERY    . CESAREAN SECTION    . TUBAL LIGATION  2009    Family History  Problem Relation Age of Onset  . Diabetes Mother   . Hypertension Mother   . Hypertension Sister     Social History  Substance Use Topics  . Smoking status: Never Smoker  . Smokeless tobacco: Never Used  . Alcohol use No    ROS Review of Systems  Constitutional: Negative for appetite change, chills and fever.  Eyes: Negative for visual disturbance.  Respiratory: Negative for shortness of breath.   Cardiovascular: Negative for chest pain.  Gastrointestinal: Negative for abdominal pain and blood in stool.  Endocrine: Negative for polydipsia, polyphagia and polyuria.  Musculoskeletal: Negative for back pain.  Skin:  Positive for rash (has facial discoloration for long history of asthma ).  Allergic/Immunologic: Negative for immunocompromised state.  Neurological: Positive for speech difficulty and weakness (LUE ). Negative for dizziness, tremors, seizures, syncope, facial asymmetry, light-headedness, numbness and headaches.  Hematological: Negative for adenopathy. Does not bruise/bleed easily.  Psychiatric/Behavioral: Negative for dysphoric mood and suicidal ideas.    Objective:   Today's Vitals: BP (!) 157/86 (BP Location: Right Arm, Patient Position: Sitting, Cuff Size: Small)   Pulse 74   Temp 98.2 F (36.8 C) (Oral)   Ht '4\' 11"'$  (1.499 m)   Wt 186 lb 6.4 oz (84.6 kg)   LMP 09/02/2016   SpO2 100%   BMI 37.65 kg/m   Wt Readings from Last 3 Encounters:  10/13/16 186 lb 6.4 oz (84.6 kg)  04/19/16 193 lb (87.5 kg)  09/29/15 193 lb (87.5 kg)   BP Readings from Last 3 Encounters:  10/13/16 (!) 157/86  04/19/16 152/78  09/29/15 (!) 150/85     Physical Exam  Constitutional: She is oriented to person, place, and time. She appears well-developed and well-nourished. No distress.  HENT:  Head: Normocephalic and atraumatic.  Right Ear: Tympanic membrane, external ear and ear canal normal.  Left Ear: Tympanic membrane and external ear normal.  Eyes: Conjunctivae are normal. Pupils are equal, round, and reactive to light. Right eye exhibits nystagmus (with contralateral gaze ). Left eye  exhibits no nystagmus.  Cardiovascular: Normal rate, regular rhythm, normal heart sounds and intact distal pulses.   Pulmonary/Chest: Effort normal and breath sounds normal.  Musculoskeletal: She exhibits no edema.       Right shoulder: She exhibits normal range of motion.  Neurological: She is alert and oriented to person, place, and time. She exhibits abnormal muscle tone. Coordination normal.  Increased tone in L upper arm with lack of movement in hand, elbow and shoulder.   Slight expressive aphasia with  slowed speech.   Skin: Skin is warm and dry. No rash noted.  Maceration between toes on L foot 4th and 5th    Psychiatric: She has a normal mood and affect.   Lab Results  Component Value Date   HGBA1C 11.50 09/29/2015    CBG 206  Assessment & Plan:   [name] was seen today for diabetes.  Diagnoses and all orders for this visit:  Uncontrolled type 2 diabetes mellitus with complication, without long-term current use of insulin (HCC) -     POCT glucose (manual entry) -     HgB A1c -     sitaGLIPtin-metformin (JANUMET) 50-1000 MG tablet; Take 1 tablet by mouth 2 (two) times daily with a meal.  Cerebrovascular accident (CVA) due to thrombosis of precerebral artery (HCC) -     aspirin EC 81 MG tablet; Take 1 tablet (81 mg total) by mouth daily. -     atorvastatin (LIPITOR) 40 MG tablet; Take 1 tablet (40 mg total) by mouth daily.  Essential hypertension -     losartan (COZAAR) 50 MG tablet; Take 1 tablet (50 mg total) by mouth daily.  Tinea pedis of left foot    Outpatient Encounter Prescriptions as of 10/13/2016  Medication Sig  . atorvastatin (LIPITOR) 40 MG tablet Take 1 tablet (40 mg total) by mouth daily. (Patient not taking: Reported on 04/19/2016)  . Blood Glucose Monitoring Suppl (TRUE METRIX METER) W/DEVICE KIT 1 each by Does not apply route as needed.  . ferrous sulfate 325 (65 FE) MG tablet Take 1 tablet (325 mg total) by mouth 2 (two) times daily with a meal. (Patient not taking: Reported on 04/19/2016)  . glucose blood (TRUE METRIX BLOOD GLUCOSE TEST) test strip 1 each by Other route 3 (three) times daily.  Marland Kitchen ibuprofen (ADVIL,MOTRIN) 600 MG tablet Take 1 tablet (600 mg total) by mouth every 6 (six) hours as needed.  Marland Kitchen losartan (COZAAR) 25 MG tablet Take 1 tablet (25 mg total) by mouth daily.  . metoCLOPramide (REGLAN) 10 MG tablet Take 1 tablet (10 mg total) by mouth every 6 (six) hours as needed for nausea (headache).  . sitaGLIPtin-metformin (JANUMET) 50-1000 MG tablet  Take 1 tablet by mouth 2 (two) times daily with a meal. (Patient taking differently: Take 1 tablet by mouth daily. )  . TRUEPLUS LANCETS 28G MISC 1 each by Does not apply route 3 (three) times daily.   No facility-administered encounter medications on file as of 10/13/2016.     Follow-up: Return in about 4 weeks (around 11/10/2016) for HTN .    Boykin Nearing MD

## 2016-10-13 NOTE — Assessment & Plan Note (Signed)
A: uncontrolled but slightly improved P: Increase januvia to BID  Monitor sugars Continue exercise  Reduce carb in diet, sweet potato

## 2016-10-13 NOTE — Assessment & Plan Note (Signed)
A: HTN uncontrolled Med: compliant P: Increase losartan to 50 mg daily

## 2016-10-13 NOTE — Assessment & Plan Note (Signed)
A: CVA in 2015 P: Add statin and aspirin for secondary prevention of stroke

## 2016-10-13 NOTE — Patient Instructions (Addendum)
[name] was seen today for diabetes.  Diagnoses and all orders for this visit:  Uncontrolled type 2 diabetes mellitus with complication, without long-term current use of insulin (HCC) -     POCT glucose (manual entry) -     HgB A1c -     sitaGLIPtin-metformin (JANUMET) 50-1000 MG tablet; Take 1 tablet by mouth 2 (two) times daily with a meal. -     Blood Glucose Monitoring Suppl (TRUE METRIX METER) w/Device KIT; 1 each by Does not apply route as needed. -     glucose blood (TRUE METRIX BLOOD GLUCOSE TEST) test strip; 1 each by Other route 2 (two) times daily. -     TRUEPLUS LANCETS 28G MISC; 1 each by Does not apply route 2 (two) times daily.  Cerebrovascular accident (CVA) due to thrombosis of precerebral artery (HCC) -     aspirin EC 81 MG tablet; Take 1 tablet (81 mg total) by mouth daily. -     atorvastatin (LIPITOR) 40 MG tablet; Take 1 tablet (40 mg total) by mouth daily.  Essential hypertension -     losartan (COZAAR) 50 MG tablet; Take 1 tablet (50 mg total) by mouth daily.  Tinea pedis of left foot -     ketoconazole (NIZORAL) 2 % cream; Apply 1 application topically 2 (two) times daily. Between toes on L foot   Diabetes blood sugar goals  Fasting (in AM before breakfast, 8 hrs of no eating or drinking (except water or unsweetened coffee or tea): 90-110 2 hrs after meals: < 160,   No low sugars: nothing < 70   F/u in 4 weeks for BP check with me since increasing losartan   Dr. Adrian Blackwater

## 2016-10-13 NOTE — Progress Notes (Signed)
Pt is here to follow up on diabetes. Pt needs a new glucose machine. Has not taken blood sugar in 2 months.  Pt declined flu shot.

## 2017-02-02 ENCOUNTER — Other Ambulatory Visit: Payer: Self-pay

## 2017-02-02 ENCOUNTER — Ambulatory Visit: Payer: Self-pay | Attending: Family Medicine | Admitting: Family Medicine

## 2017-02-02 ENCOUNTER — Encounter: Payer: Self-pay | Admitting: Family Medicine

## 2017-02-02 VITALS — BP 150/85 | HR 92 | Temp 97.9°F | Ht 59.0 in | Wt 185.2 lb

## 2017-02-02 DIAGNOSIS — E119 Type 2 diabetes mellitus without complications: Secondary | ICD-10-CM | POA: Insufficient documentation

## 2017-02-02 DIAGNOSIS — Z79899 Other long term (current) drug therapy: Secondary | ICD-10-CM | POA: Insufficient documentation

## 2017-02-02 DIAGNOSIS — Z9889 Other specified postprocedural states: Secondary | ICD-10-CM | POA: Insufficient documentation

## 2017-02-02 DIAGNOSIS — Z7984 Long term (current) use of oral hypoglycemic drugs: Secondary | ICD-10-CM | POA: Insufficient documentation

## 2017-02-02 DIAGNOSIS — Z0001 Encounter for general adult medical examination with abnormal findings: Secondary | ICD-10-CM | POA: Insufficient documentation

## 2017-02-02 DIAGNOSIS — M24541 Contracture, right hand: Secondary | ICD-10-CM

## 2017-02-02 DIAGNOSIS — I10 Essential (primary) hypertension: Secondary | ICD-10-CM

## 2017-02-02 DIAGNOSIS — I6300 Cerebral infarction due to thrombosis of unspecified precerebral artery: Secondary | ICD-10-CM

## 2017-02-02 DIAGNOSIS — Z8673 Personal history of transient ischemic attack (TIA), and cerebral infarction without residual deficits: Secondary | ICD-10-CM | POA: Insufficient documentation

## 2017-02-02 DIAGNOSIS — R531 Weakness: Secondary | ICD-10-CM | POA: Insufficient documentation

## 2017-02-02 DIAGNOSIS — R29898 Other symptoms and signs involving the musculoskeletal system: Secondary | ICD-10-CM

## 2017-02-02 MED ORDER — ASPIRIN EC 81 MG PO TBEC: 81.0000 mg | DELAYED_RELEASE_TABLET | Freq: Every day | ORAL | 11 refills | Status: DC

## 2017-02-02 MED ORDER — LOSARTAN POTASSIUM-HCTZ 50-12.5 MG PO TABS: 1.0000 | ORAL_TABLET | Freq: Every day | ORAL | 11 refills | Status: DC

## 2017-02-02 MED FILL — LOSARTAN-HCTZ 50-12.5 MG TA: 50-12.5 | 30 days supply | Qty: 30 | Fill #0

## 2017-02-02 NOTE — Assessment & Plan Note (Signed)
neurorehab and pain management for therapy of possible botox injections for R hand contracture post stroke

## 2017-02-02 NOTE — Assessment & Plan Note (Signed)
BP elevated Plan: Change losartan 50 mg daily  to hyzaar 50-12.5 mg daily

## 2017-02-02 NOTE — Progress Notes (Signed)
Subjective:  Patient ID: [name], female    DOB: February 24, 1966  Age: 51 y.o. MRN: [id number]  CC: Referral   HPI [name] presents for   1. CVA: in 2015. With R arm weakness and hand contracture. Also with expressive aphasia that is mild. She is R handed. She stopped working after her stroke. She is able to cook, home school her son and do most things for herself. She denies new weakness, headache or numbness. She is not taking aspirin. She is taking statin. She request PT referral for R hand weakness and contracture.   2. HTN: dx in 2015. Taking losartan 50 mg daily.  Had cough with ACE inhibitors. She is a vegan. She eats low salt diet.     Past Medical History:  Diagnosis Date  . Diabetes mellitus without complication ([place]) 4496   previously on [place]   . Gestational diabetes 2009   . Hypertension 09/07/2014  . Stroke ([place]) 09/07/2014   R side. weakness in upper arm. No deficit in leg. deficit in speech. A littel bit in coordination.     Past Surgical History:  Procedure Laterality Date  . BREAST SURGERY    . CESAREAN SECTION    . TUBAL LIGATION  2009    Family History  Problem Relation Age of Onset  . Diabetes Mother   . Hypertension Mother   . Hypertension Sister     Social History  Substance Use Topics  . Smoking status: Never Smoker  . Smokeless tobacco: Never Used  . Alcohol use No    ROS Review of Systems  Constitutional: Negative for appetite change, chills and fever.  Eyes: Negative for visual disturbance.  Respiratory: Negative for shortness of breath.   Cardiovascular: Negative for chest pain.  Gastrointestinal: Negative for abdominal pain and blood in stool.  Endocrine: Negative for polydipsia, polyphagia and polyuria.  Musculoskeletal: Negative for back pain.  Skin: Positive for rash (has facial discoloration for long history of asthma ).  Allergic/Immunologic: Negative for immunocompromised state.  Neurological: Positive for speech  difficulty and weakness (LUE ). Negative for dizziness, tremors, seizures, syncope, facial asymmetry, light-headedness, numbness and headaches.  Hematological: Negative for adenopathy. Does not bruise/bleed easily.  Psychiatric/Behavioral: Negative for dysphoric mood and suicidal ideas.    Objective:   Today's Vitals: BP (!) 150/85 (BP Location: Left Arm, Patient Position: Sitting, Cuff Size: Small)   Pulse 92   Temp 97.9 F (36.6 C) (Oral)   Ht '4\' 11"'$  (1.499 m)   Wt 185 lb 3.2 oz (84 kg)   LMP 01/15/2017   SpO2 98%   BMI 37.41 kg/m   Wt Readings from Last 3 Encounters:  02/02/17 185 lb 3.2 oz (84 kg)  10/13/16 186 lb 6.4 oz (84.6 kg)  04/19/16 193 lb (87.5 kg)   BP Readings from Last 3 Encounters:  02/02/17 (!) 150/85  10/13/16 (!) 157/86  04/19/16 152/78     Physical Exam  Constitutional: She is oriented to person, place, and time. She appears well-developed and well-nourished. No distress.  HENT:  Head: Normocephalic and atraumatic.  Right Ear: Tympanic membrane, external ear and ear canal normal.  Left Ear: Tympanic membrane and external ear normal.  Eyes: Pupils are equal, round, and reactive to light.  Cardiovascular: Normal rate, regular rhythm, normal heart sounds and intact distal pulses.   Pulmonary/Chest: Effort normal and breath sounds normal.  Musculoskeletal: She exhibits no edema.       Right shoulder: She exhibits normal  range of motion.  Neurological: She is alert and oriented to person, place, and time. She exhibits abnormal muscle tone. Coordination normal.  Increased tone R hand with lack of full extension of first two fingers   Slight expressive aphasia with slowed speech.   Skin: Skin is warm and dry. No rash noted.     Psychiatric: She has a normal mood and affect.   Lab Results  Component Value Date   HGBA1C 10.4 10/13/2016    CBG 206  Assessment & Plan:   Annet was seen today for referral.  Diagnoses and all orders for this  visit:  Right hand weakness -     Cancel: Ambulatory Referral to Neuro Rehab -     Ambulatory Referral to Neuro Rehab  Cerebrovascular accident (CVA) due to thrombosis of precerebral artery (HCC) -     aspirin EC 81 MG tablet; Take 1 tablet (81 mg total) by mouth daily.  Essential hypertension -     losartan-hydrochlorothiazide (HYZAAR) 50-12.5 MG tablet; Take 1 tablet by mouth daily.  Contracture of joint of finger of right hand -     Cancel: Ambulatory referral to Pain Clinic -     Ambulatory referral to Pain Clinic    Outpatient Encounter Prescriptions as of 02/02/2017  Medication Sig  . atorvastatin (LIPITOR) 40 MG tablet Take 1 tablet (40 mg total) by mouth daily.  . Blood Glucose Monitoring Suppl (TRUE METRIX METER) w/Device KIT 1 each by Does not apply route as needed.  Marland Kitchen glucose blood (TRUE METRIX BLOOD GLUCOSE TEST) test strip 1 each by Other route 2 (two) times daily.  Marland Kitchen losartan (COZAAR) 50 MG tablet Take 1 tablet (50 mg total) by mouth daily.  . sitaGLIPtin-metformin (JANUMET) 50-1000 MG tablet Take 1 tablet by mouth 2 (two) times daily with a meal.  . TRUEPLUS LANCETS 28G MISC 1 each by Does not apply route 2 (two) times daily.  Marland Kitchen aspirin EC 81 MG tablet Take 1 tablet (81 mg total) by mouth daily. (Patient not taking: Reported on 02/02/2017)  . ketoconazole (NIZORAL) 2 % cream Apply 1 application topically 2 (two) times daily. Between toes on L foot (Patient not taking: Reported on 02/02/2017)   No facility-administered encounter medications on file as of 02/02/2017.     Follow-up: Return in about 4 weeks (around 03/02/2017) for pap smear.    Boykin Nearing MD

## 2017-02-02 NOTE — Patient Instructions (Addendum)
Jordan ElmSylvia was seen today for referral.  Diagnoses and all orders for this visit:  Right hand weakness -     Cancel: Ambulatory Referral to Neuro Rehab -     Ambulatory Referral to Neuro Rehab  Cerebrovascular accident (CVA) due to thrombosis of precerebral artery (HCC) -     aspirin EC 81 MG tablet; Take 1 tablet (81 mg total) by mouth daily.  Essential hypertension -     losartan-hydrochlorothiazide (HYZAAR) 50-12.5 MG tablet; Take 1 tablet by mouth daily.  Contracture of joint of finger of right hand -     Cancel: Ambulatory referral to Pain Clinic -     Ambulatory referral to Pain Clinic   You will be called about pain management and neurorehab appointments  F/u in 4 weeks for pap smear   Dr. Armen PickupFunches

## 2017-02-16 ENCOUNTER — Encounter: Payer: Self-pay | Admitting: Physical Medicine & Rehabilitation

## 2017-02-21 ENCOUNTER — Ambulatory Visit: Payer: Medicare Other | Attending: Family Medicine | Admitting: Occupational Therapy

## 2017-02-21 DIAGNOSIS — R278 Other lack of coordination: Secondary | ICD-10-CM

## 2017-02-21 DIAGNOSIS — G8111 Spastic hemiplegia affecting right dominant side: Secondary | ICD-10-CM | POA: Diagnosis not present

## 2017-02-21 DIAGNOSIS — M6281 Muscle weakness (generalized): Secondary | ICD-10-CM | POA: Insufficient documentation

## 2017-02-21 DIAGNOSIS — I639 Cerebral infarction, unspecified: Secondary | ICD-10-CM | POA: Diagnosis not present

## 2017-02-21 DIAGNOSIS — M25512 Pain in left shoulder: Secondary | ICD-10-CM | POA: Insufficient documentation

## 2017-02-21 DIAGNOSIS — R293 Abnormal posture: Secondary | ICD-10-CM | POA: Diagnosis not present

## 2017-02-21 DIAGNOSIS — IMO0002 Reserved for concepts with insufficient information to code with codable children: Secondary | ICD-10-CM

## 2017-02-21 NOTE — Therapy (Signed)
[facility] [address] [place], [place], [address] Phone: [phone]   Fax:  [phone]  Occupational Therapy Evaluation  Patient Details  Name: [name] MRN: [id number] Date of Birth: 06/20/1966 Referring Provider: Dr. Dessa Phi  Encounter Date: 02/21/2017      OT End of Session - 02/21/17 1117    Visit Number 1   Number of Visits 17   Date for OT Re-Evaluation 04/22/17   Authorization Type MCR - G code needed   Authorization - Visit Number 1   Authorization - Number of Visits 10   OT Start Time 1018   OT Stop Time 1103   OT Time Calculation (min) 45 min   Activity Tolerance Patient tolerated treatment well      Past Medical History:  Diagnosis Date  . Diabetes mellitus without complication (HCC) 2013   previously on [place]   . Gestational diabetes 2009   . Hypertension 09/07/2014  . Stroke (HCC) 09/07/2014   R side. weakness in upper arm. No deficit in leg. deficit in speech. A littel bit in coordination.     Past Surgical History:  Procedure Laterality Date  . BREAST SURGERY    . CESAREAN SECTION    . TUBAL LIGATION  2009    There were no vitals filed for this visit.      Subjective Assessment - 02/21/17 1023    Patient is accompained by: Family member  husband   Pertinent History CVA 09/07/2014 with Rt dominant residual spastic hemiplegia   Currently in Pain? No/denies           [facility] OT Assessment - 02/21/17 0001      Assessment   Diagnosis Rt hand weakness  spasticity Rt hand from CVA   Referring Provider Dr. Dessa Phi   Onset Date --  from previous CVA 09/07/2014   Assessment Pt with dominant side residual spasticity   Prior Therapy short stay outpatient      Precautions   Precautions None     Restrictions   Weight Bearing Restrictions No     Balance Screen   Has the patient fallen in the past 6 months No   Has the patient had a decrease in activity  level because of a fear of falling?  No   Is the patient reluctant to leave their home because of a fear of falling?  No     Home  Environment   Adaptive equipment Feeding equipment   Additional Comments Pt lives with spouse and 2 children in 2 story home   Lives With Family     Prior Function   Level of Independence Independent  prior to CVA 2015   Vocation On disability     ADL   Eating/Feeding Needs assist with cutting food  using Lt non dominant side since 2015   Grooming Modified independent  using Lt hand   Upper Body Bathing Modified independent   Lower Body Bathing Modified independent   Upper Body Dressing Minimal assistance  hook bra, zipper in back, tying shoes (wears slip ons mostly   Lower Body Dressing --  see above   Glass blower/designer - Conservator, museum/gallery -  Tour manager Independent     IADL   Light Housekeeping Does personal laundry completely;Performs light daily tasks such as dishwashing, bed making   Meal Prep Plans, prepares and serves adequate meals independently  assist to  peel, to drain larger pots   Engineer, manufacturing Status Independent     Written Expression   Dominant Hand Right  prior to CVA, using Lt hand now     Vision - History   Baseline Vision --  does not wear glasses     Observation/Other Assessments   Observations word finding difficulties noted (from CVA)      Sensation   Additional Comments delayed, diminshed Rt hand, not consistent with localizing stimuli     Coordination   9 Hole Peg Test Right   Right 9 Hole Peg Test unable   Box and Blocks Rt = 12, Lt = 53   Coordination Pt unable to pick up pen, but can pick up highlighter. Unable to manipulate in hand.      Edema   Edema none during evaluation     Tone   Assessment Location Right Upper Extremity     ROM / Strength   AROM / PROM / Strength  AROM     AROM   Overall AROM Comments LUE WNL's. RUE shoulder and elbow WFL's but with decreased distal control. Supination and wrist extension 75% with tone 1/4. Full finger flexion, composite finger ext 90% with decreased PIP extension index and long finger.    AROM Assessment Site Finger   Right/Left Finger Right   Right Composite Finger Extension --  Rt index ext = -65, long ext = -85     Hand Function   Right Hand Grip (lbs) 18   Left Hand Grip (lbs) 49     RUE Tone   RUE Tone Mild;Hypertonic     RUE Tone   Hypertonic Details MAS: 1/4 for supination and wrist extension. 2/4 for finger extension index and long finger                         OT Education - 02/21/17 1114    Education provided Yes   Education Details Instructed to discontinue squeezing ball, education in botox to manage spasticity, plan of care   Person(s) Educated Patient;Spouse   Methods Explanation   Comprehension Verbalized understanding          OT Short Term Goals - 02/21/17 1122      OT SHORT TERM GOAL #1   Title Independent with initial HEP for functional safe activities for Rt hand (due 03/23/17)    Time 4   Period Weeks   Status New     OT SHORT TERM GOAL #2   Title Pt to verbalize understanding with safety techniques d/t loss of sensation Rt hand   Time 4   Period Weeks   Status New     OT SHORT TERM GOAL #3   Title Pt to verbalize understanding with A/E to increase ease and safety with ADLS/IADLS   Time 4   Period Weeks   Status New           OT Long Term Goals - 02/21/17 1124      OT LONG TERM GOAL #1   Title Independent with updated HEP prn following botox (due 04/22/17)    Time 8   Period Weeks   Status New     OT LONG TERM GOAL #2   Title Pt to improve RUE functional use as evidenced by performing 20 blocks or greater on Box & Blocks test   Baseline eval = 12 (Lt = 53)  Time 8   Period Weeks   Status New     OT LONG TERM GOAL #3   Title Pt  independent with splint wear and care following botox (finger splints for PIP ext Rt index and long finger)    Time 8   Period Weeks   Status New     OT LONG TERM GOAL #4   Title Pt to use RUE as gross assist for all BADLS and bilateral tasks   Time 8   Period Weeks   Status New               Plan - 02/21/17 1117    Clinical Impression Statement Pt is a 51 y.o. female who presents to outpatient rehab with spasticity Rt dominant UE, mostly hand, s/p CVA 09/07/14. Pt with learned non-use since 2015, but has fairly good movement RUE, although hand dominated by synergy pattern. Pt reports only getting 4 visits of outpatient therapy when CVA first happened. Pt now on long term disability.   Rehab Potential Good   OT Frequency 2x / week   OT Duration 8 weeks  actually seen by O.T. (anticipate 2-4 weeks placed on hold for botox)    OT Treatment/Interventions Self-care/ADL training;DME and/or AE instruction;Splinting;Patient/family education;Moist Heat;Therapeutic exercises;Therapeutic activities;Cryotherapy;Neuromuscular education;Passive range of motion;Cognitive remediation/compensation;Electrical Stimulation;Manual Therapy   Plan functional use of Rt hand (issue modified CIT activities and what not to do with Rt hand for safety), begin A/E training and issue handouts on options for tying shoes, one handed cutting board, rocker knife, etc   Consulted and Agree with Plan of Care Patient      Patient will benefit from skilled therapeutic intervention in order to improve the following deficits and impairments:  Decreased coordination, Decreased range of motion, Impaired flexibility, Impaired sensation, Impaired tone, Decreased knowledge of precautions, Decreased knowledge of use of DME, Impaired UE functional use, Pain, Decreased strength, Decreased cognition  Visit Diagnosis: 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 [facility](HCC) - Plan: Ot plan of care  cert/re-cert  Other lack of coordination - Plan: Ot plan of care cert/re-cert      G-Codes - 02/21/17 1131    Functional Assessment Tool Used (Outpatient only) RUE: Box & Blocks = 12, 9 hole peg: unable   Functional Limitation Carrying, moving and handling objects   Carrying, Moving and Handling Objects Current Status ([id number]) At least 60 percent but less than 80 percent impaired, limited or restricted   Carrying, Moving and Handling Objects Goal Status ([id number](G8985) At least 20 percent but less than 40 percent impaired, limited or restricted      Problem List Patient Active Problem List   Diagnosis Date Noted  . Contracture of joint of finger of right hand 02/02/2017  . Right hand weakness 02/02/2017  . CVA (cerebral vascular accident) (HCC) 09/29/2015  . HTN (hypertension) 09/29/2015  . Diabetes mellitus type 2, uncontrolled, with complications (HCC) 09/29/2015  . Tinea pedis 09/29/2015  . Eczema 09/29/2015    Kelli ChurnBallie, Oddie Bottger Johnson, OTR/L 02/21/2017, 11:34 AM  [facility] [address] [place], [place], [address] Phone: [phone]   Fax:  [phone]  Name: Guy SandiferSylvia L Hill MRN: [id number] Date of Birth: [birth year]

## 2017-02-27 ENCOUNTER — Ambulatory Visit: Payer: Medicare Other | Admitting: *Deleted

## 2017-03-01 ENCOUNTER — Encounter: Payer: Self-pay | Admitting: *Deleted

## 2017-03-01 ENCOUNTER — Ambulatory Visit: Payer: Medicare Other | Admitting: *Deleted

## 2017-03-01 DIAGNOSIS — IMO0002 Reserved for concepts with insufficient information to code with codable children: Secondary | ICD-10-CM

## 2017-03-01 DIAGNOSIS — R278 Other lack of coordination: Secondary | ICD-10-CM

## 2017-03-01 DIAGNOSIS — G8111 Spastic hemiplegia affecting right dominant side: Secondary | ICD-10-CM | POA: Diagnosis not present

## 2017-03-01 DIAGNOSIS — I639 Cerebral infarction, unspecified: Secondary | ICD-10-CM | POA: Diagnosis not present

## 2017-03-01 DIAGNOSIS — M6281 Muscle weakness (generalized): Secondary | ICD-10-CM | POA: Diagnosis not present

## 2017-03-01 DIAGNOSIS — R293 Abnormal posture: Secondary | ICD-10-CM | POA: Diagnosis not present

## 2017-03-01 DIAGNOSIS — M25512 Pain in left shoulder: Secondary | ICD-10-CM | POA: Diagnosis not present

## 2017-03-01 NOTE — Therapy (Signed)
[facility] [address] [place], [place], [address] Phone: [phone]   Fax:  [phone]  Occupational Therapy Treatment  Patient Details  Name: [name] MRN: [id number] Date of Birth: 1966/05/30 Referring Provider: Dr. Dessa Phi  Encounter Date: 03/01/2017      OT End of Session - 03/01/17 1226    Visit Number 2   Number of Visits 17   Date for OT Re-Evaluation 04/22/17   Authorization Type MCR - G code needed   Authorization - Visit Number 2   Authorization - Number of Visits 10   OT Start Time 0930   OT Stop Time 1014   OT Time Calculation (min) 44 min   Activity Tolerance Patient tolerated treatment well   Behavior During Therapy [facility] for tasks assessed/performed      Past Medical History:  Diagnosis Date  . Diabetes mellitus without complication (HCC) 2013   previously on victoza   . Gestational diabetes 2009   . Hypertension 09/07/2014  . Stroke (HCC) 09/07/2014   R side. weakness in upper arm. No deficit in leg. deficit in speech. A littel bit in coordination.     Past Surgical History:  Procedure Laterality Date  . BREAST SURGERY    . CESAREAN SECTION    . TUBAL LIGATION  2009    There were no vitals filed for this visit.      Subjective Assessment - 03/01/17 1215    Currently in Pain? Yes   Pain Score 3    Pain Location Back   Pain Orientation Left   Pain Descriptors / Indicators Discomfort   Pain Type Chronic pain   Pain Onset More than a month ago   Pain Frequency Intermittent   Aggravating Factors  Overuse L UE per pt report, her left side "Hurts b/c I do everything with this side"   Pain Relieving Factors Rest, massage, whirlpool at [facility]                      OT Treatments/Exercises (OP) - 03/01/17 0001      ADLs   Overall ADLs Discussed avoidance of using RUE during cooking or when hot/cold water is involved when using techniques for modified CIT.  Examples given and discussed with pt. She will focus right now on wearing an oven mitt on her left hand and doing some basic grasp and release of cups/stacking, light grooming tasks etc, self feeding etc. She should benefit from use of adaptive utensils as she has difficulty grasping and has overall decreased endurance.   ADL Comments Reviewed and discussed use of rocker knife at home as well as one handed cutting board and elastic shoelaces. Pt states that she has a rocker knife that she has had "Since I had the stroke" and reports that she has had is sharpened but it "doesn't work very well, I think I need a new one" Pt should benefit from continued discussion of ADL's and possible one handed a/e that may increase her independence.      Neurological Re-education Exercises   Other Grasp and Release Exercises  R hand to grasp and release 1 in blocks into bowl placed in front of her and to the left x20. Grasp and release of large cones and small cones x12 each. Focus on over ehagerrated finger extension and then flexion (pause to do this) during grasp and release. Large grip pegs using left hand only to grasp and place into peg  board with moderated difficulty noted. x 10, multiple rest breaks required.  CIT techniques - wearing an oven mit on L hand during activi   Other Grasp and Release Exercises  Discussed Modified CIT at home. Wearing oven mit on left hand and using Right for light activites - grasp and release of objects. Discussed avoidance of shoulder hike/compensatory patterns etc.                OT Education - 03/01/17 1223    Education provided Yes   Education Details Imstructed to practice CIT techniques (wearing an oven mitt on left hand) and performing basic ADL's using R UE (self feeding, grasp and release of plastic cups at home etc). Pt was educated to avoid any cooking activites or tasks during CIT as she demonstrates decreased sensibility RUE.   Person(s) Educated Patient    Methods Explanation;Verbal cues   Comprehension Verbalized understanding;Need further instruction          OT Short Term Goals - 03/01/17 1230      OT SHORT TERM GOAL #1   Title Independent with initial HEP for functional safe activities for Rt hand (due 03/23/17)    Time 4   Period Weeks   Status On-going     OT SHORT TERM GOAL #2   Title Pt to verbalize understanding with safety techniques d/t loss of sensation Rt hand   Time 4   Status On-going     OT SHORT TERM GOAL #3   Title Pt to verbalize understanding with A/E to increase ease and safety with ADLS/IADLS   Time 4   Period Weeks   Status On-going           OT Long Term Goals - 02/21/17 1124      OT LONG TERM GOAL #1   Title Independent with updated HEP prn following botox (due 04/22/17)    Time 8   Period Weeks   Status New     OT LONG TERM GOAL #2   Title Pt to improve RUE functional use as evidenced by performing 20 blocks or greater on Box & Blocks test   Baseline eval = 12 (Lt = 53)    Time 8   Period Weeks   Status New     OT LONG TERM GOAL #3   Title Pt independent with splint wear and care following botox (finger splints for PIP ext Rt index and long finger)    Time 8   Period Weeks   Status New     OT LONG TERM GOAL #4   Title Pt to use RUE as gross assist for all BADLS and bilateral tasks   Time 8   Period Weeks   Status New               Plan - 03/01/17 1226    Clinical Impression Statement Pt may benefit from a/e instruction and possible options for 1 handed use as well as trial of CIT left UE to encourage increased functional use RUE that was affected by CVA since 2015.    Rehab Potential Good   OT Frequency 2x / week   OT Duration 8 weeks  Anticipate 2-4 weeks of placed on hold while waiting for botox   OT Treatment/Interventions Self-care/ADL training;DME and/or AE instruction;Splinting;Patient/family education;Moist Heat;Therapeutic exercises;Therapeutic  activities;Cryotherapy;Neuromuscular education;Passive range of motion;Cognitive remediation/compensation;Electrical Stimulation;Manual Therapy   Plan Cont with functional use of R UE (Cont with modified CIT activities) as well as a/e training and  issue handouts on options for tying shoes. Consider issuing cylindrical foam for use on utensils at home.   Consulted and Agree with Plan of Care Patient      Patient will benefit from skilled therapeutic intervention in order to improve the following deficits and impairments:  Decreased coordination, Decreased range of motion, Impaired flexibility, Impaired sensation, Impaired tone, Decreased knowledge of precautions, Decreased knowledge of use of DME, Impaired UE functional use, Pain, Decreased strength, Decreased cognition  Visit Diagnosis: 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 [facility])  Other lack of coordination    Problem List Patient Active Problem List   Diagnosis Date Noted  . Contracture of joint of finger of right hand 02/02/2017  . Right hand weakness 02/02/2017  . CVA (cerebral vascular accident) (HCC) 09/29/2015  . HTN (hypertension) 09/29/2015  . Diabetes mellitus type 2, uncontrolled, with complications (HCC) 09/29/2015  . Tinea pedis 09/29/2015  . Eczema 09/29/2015    Emani Taussig Beth Dixon, OTR/L 03/01/2017, 12:32 PM  [place] [facility] [address] [place], [place], [address] Phone: [phone]   Fax:  [phone]  Name: [name] MRN: [id number] Date of Birth: 08/08/1966

## 2017-03-02 ENCOUNTER — Other Ambulatory Visit: Payer: Self-pay | Admitting: Family Medicine

## 2017-03-02 ENCOUNTER — Ambulatory Visit: Payer: Medicare Other | Attending: Family Medicine | Admitting: Family Medicine

## 2017-03-02 ENCOUNTER — Encounter: Payer: Self-pay | Admitting: Family Medicine

## 2017-03-02 ENCOUNTER — Other Ambulatory Visit (HOSPITAL_COMMUNITY)
Admission: RE | Admit: 2017-03-02 | Discharge: 2017-03-02 | Disposition: A | Discharge status: Home / Self Care | Payer: Medicare Other | Source: Ambulatory Visit | Attending: Family Medicine | Admitting: Family Medicine

## 2017-03-02 VITALS — BP 146/82 | HR 89 | Temp 98.1°F | Ht 59.0 in | Wt 192.0 lb

## 2017-03-02 DIAGNOSIS — Z7982 Long term (current) use of aspirin: Secondary | ICD-10-CM | POA: Insufficient documentation

## 2017-03-02 DIAGNOSIS — Z79899 Other long term (current) drug therapy: Secondary | ICD-10-CM | POA: Insufficient documentation

## 2017-03-02 DIAGNOSIS — E118 Type 2 diabetes mellitus with unspecified complications: Secondary | ICD-10-CM | POA: Diagnosis not present

## 2017-03-02 DIAGNOSIS — Z114 Encounter for screening for human immunodeficiency virus [HIV]: Secondary | ICD-10-CM | POA: Diagnosis not present

## 2017-03-02 DIAGNOSIS — Z1151 Encounter for screening for human papillomavirus (HPV): Secondary | ICD-10-CM | POA: Insufficient documentation

## 2017-03-02 DIAGNOSIS — IMO0002 Reserved for concepts with insufficient information to code with codable children: Secondary | ICD-10-CM

## 2017-03-02 DIAGNOSIS — Z124 Encounter for screening for malignant neoplasm of cervix: Secondary | ICD-10-CM | POA: Diagnosis not present

## 2017-03-02 DIAGNOSIS — E1165 Type 2 diabetes mellitus with hyperglycemia: Secondary | ICD-10-CM | POA: Insufficient documentation

## 2017-03-02 DIAGNOSIS — Z113 Encounter for screening for infections with a predominantly sexual mode of transmission: Secondary | ICD-10-CM | POA: Insufficient documentation

## 2017-03-02 DIAGNOSIS — Z01419 Encounter for gynecological examination (general) (routine) without abnormal findings: Secondary | ICD-10-CM | POA: Diagnosis not present

## 2017-03-02 DIAGNOSIS — Z01411 Encounter for gynecological examination (general) (routine) with abnormal findings: Secondary | ICD-10-CM | POA: Insufficient documentation

## 2017-03-02 DIAGNOSIS — I6300 Cerebral infarction due to thrombosis of unspecified precerebral artery: Secondary | ICD-10-CM | POA: Diagnosis not present

## 2017-03-02 DIAGNOSIS — Z7984 Long term (current) use of oral hypoglycemic drugs: Secondary | ICD-10-CM | POA: Insufficient documentation

## 2017-03-02 DIAGNOSIS — I10 Essential (primary) hypertension: Secondary | ICD-10-CM | POA: Diagnosis not present

## 2017-03-02 LAB — COMPLETE METABOLIC PANEL WITH GFR
ALBUMIN: 4 g/dL (ref 3.6–5.1)
ALK PHOS: 80 U/L (ref 33–115)
ALT: 12 U/L (ref 6–29)
AST: 22 U/L (ref 10–35)
BILIRUBIN TOTAL: 0.4 mg/dL (ref 0.2–1.2)
BUN: 7 mg/dL (ref 7–25)
CO2: 26 mmol/L (ref 20–31)
CREATININE: 0.62 mg/dL (ref 0.50–1.10)
Calcium: 9.4 mg/dL (ref 8.6–10.2)
Chloride: 102 mmol/L (ref 98–110)
GFR, Est African American: >89 mL/min (ref >=60)
GFR, Est Non African American: >89 mL/min (ref >=60)
GLUCOSE: 189 mg/dL — AB (ref 65–99)
Potassium: 4.3 mmol/L (ref 3.5–5.3)
Sodium: 136 mmol/L (ref 135–146)
TOTAL PROTEIN: 7.1 g/dL (ref 6.1–8.1)

## 2017-03-02 LAB — LIPID PANEL
Cholesterol: 200 mg/dL — ABNORMAL HIGH (ref <200)
HDL: 36 mg/dL — ABNORMAL LOW (ref >50)
LDL CALC: 138 mg/dL — AB (ref <100)
Total CHOL/HDL Ratio: 5.6 Ratio — ABNORMAL HIGH (ref <5.0)
Triglycerides: 128 mg/dL (ref <150)
VLDL: 26 mg/dL (ref <30)

## 2017-03-02 LAB — POCT GLYCOSYLATED HEMOGLOBIN (HGB A1C): Hemoglobin A1C: 9.9

## 2017-03-02 LAB — GLUCOSE, POCT (MANUAL RESULT ENTRY): POC GLUCOSE: 171 mg/dL — AB (ref 70–99)

## 2017-03-02 MED ORDER — SITAGLIPTIN PHOS-METFORMIN HCL 50-1000 MG PO TABS: 1.0000 | ORAL_TABLET | Freq: Two times a day (BID) | ORAL | 5 refills | Status: DC

## 2017-03-02 MED ORDER — GLIPIZIDE 5 MG PO TABS: 5.0000 mg | ORAL_TABLET | Freq: Two times a day (BID) | ORAL | 3 refills | Status: DC

## 2017-03-02 MED ORDER — LOSARTAN POTASSIUM-HCTZ 50-12.5 MG PO TABS: 2.0000 | ORAL_TABLET | Freq: Every day | ORAL | 11 refills | Status: DC

## 2017-03-02 MED ORDER — CLONIDINE HCL 0.1 MG PO TABS
0.2000 mg | ORAL_TABLET | Freq: Once | ORAL | Status: AC
  Administered 2017-03-02: 0.2 mg via ORAL

## 2017-03-02 MED FILL — LOSARTAN-HCTZ 50-12.5 MG TA: 50-12.5 | 30 days supply | Qty: 60 | Fill #0

## 2017-03-02 MED FILL — **JANUMET 50-1000 MG TABLET: 50-1000 | 14 days supply | Qty: 28 | Fill #0

## 2017-03-02 MED FILL — glipiZIDE 5 MG TABS: 5 | 30 days supply | Qty: 60 | Fill #0

## 2017-03-02 NOTE — Patient Instructions (Addendum)
Nettie ElmSylvia was seen today for gynecologic exam.  Diagnoses and all orders for this visit:  Pap smear for cervical cancer screening -     Cytology - PAP  Uncontrolled type 2 diabetes mellitus with complication, without long-term current use of insulin (HCC) -     POCT glucose (manual entry) -     POCT glycosylated hemoglobin (Hb A1C) -     sitaGLIPtin-metformin (JANUMET) 50-1000 MG tablet; Take 1 tablet by mouth 2 (two) times daily with a meal. -     glipiZIDE (GLUCOTROL) 5 MG tablet; Take 1 tablet (5 mg total) by mouth 2 (two) times daily before a meal.  Essential hypertension -     cloNIDine (CATAPRES) tablet 0.2 mg; Take 2 tablets (0.2 mg total) by mouth once. -     losartan-hydrochlorothiazide (HYZAAR) 50-12.5 MG tablet; Take 2 tablets by mouth daily. -     COMPLETE METABOLIC PANEL WITH GFR -     Lipid Panel  Encounter for screening for HIV -     HIV antibody (with reflex)  you diabetes has improved A1c is down but goal is < 9 Add glipizide 5 mg twice daily with food this is a type of medication that stimulates insulin release from pancrease and decreases sugar output from the liver   checking labs: cholesterol, metabolic On your healthcare maintenance HIV screen is also do and has been ordered. If you prefer not to check please let me know and it can be canceled.    f/u in 2 weeks for BP check with clinical pharmacologist  f/u in 4 weeks with me to take a closer look at your L shoulder and back in the meantime use tylenol for pain, tart cherry juice and tumeric are natural antiinflammatories  Dr. Armen PickupFunches

## 2017-03-02 NOTE — Progress Notes (Addendum)
Subjective:  Patient ID: [name], female    DOB: 1966/06/28  Age: 51 y.o. MRN: [id number]  CC: Gynecologic Exam   HPI [name] has hx of CVA with R sided weakness, diabetes and HTN she  presents for   1. Pap smear: she has no known hx of abnormal pap smears. She has hx of fibroidectomy during c-section 8 years ago.   2. HTN: compliant wiht hyzaar, 50-12.5 mg daily. No medication this AM. Reports headache x 3 days.  She is a vegan. She admits to eating high salt snack foods.   3. DM2: taking janumet. She is a vegan. She walks and uses light weights daily for exercise.   Social History  Substance Use Topics  . Smoking status: Never Smoker  . Smokeless tobacco: Never Used  . Alcohol use No    Outpatient Medications Prior to Visit  Medication Sig Dispense Refill  . aspirin EC 81 MG tablet Take 1 tablet (81 mg total) by mouth daily. 30 tablet 11  . atorvastatin (LIPITOR) 40 MG tablet Take 1 tablet (40 mg total) by mouth daily. 30 tablet 11  . Blood Glucose Monitoring Suppl (TRUE METRIX METER) w/Device KIT 1 each by Does not apply route as needed. 1 kit 0  . glucose blood (TRUE METRIX BLOOD GLUCOSE TEST) test strip 1 each by Other route 2 (two) times daily. 100 each 11  . losartan-hydrochlorothiazide (HYZAAR) 50-12.5 MG tablet Take 1 tablet by mouth daily. 30 tablet 11  . sitaGLIPtin-metformin (JANUMET) 50-1000 MG tablet Take 1 tablet by mouth 2 (two) times daily with a meal. 60 tablet 5  . TRUEPLUS LANCETS 28G MISC 1 each by Does not apply route 2 (two) times daily. 100 each 11   No facility-administered medications prior to visit.     ROS Review of Systems  Constitutional: Negative for chills and fever.  Eyes: Negative for visual disturbance.  Respiratory: Negative for shortness of breath.   Cardiovascular: Negative for chest pain.  Gastrointestinal: Negative for abdominal pain and blood in stool.  Musculoskeletal: Positive for arthralgias and back pain (L  shoulder x 3 weeks ).  Skin: Negative for rash.  Allergic/Immunologic: Negative for immunocompromised state.  Hematological: Negative for adenopathy. Does not bruise/bleed easily.  Psychiatric/Behavioral: Negative for dysphoric mood and suicidal ideas.    Objective:  BP (!) 181/93   Pulse 89   Temp 98.1 F (36.7 C) (Oral)   Ht '4\' 11"'$  (1.499 m)   Wt 192 lb (87.1 kg)   LMP 02/23/2017   SpO2 99%   BMI 38.78 kg/m   BP/Weight 03/02/2017 02/02/2017 42/35/3614  Systolic BP 431 540 086  Diastolic BP 93 85 86  Wt. (Lbs) 192 185.2 186.4  BMI 38.78 37.41 37.65    Physical Exam  Constitutional: She appears well-developed and well-nourished. No distress.  Cardiovascular: Normal rate, regular rhythm, normal heart sounds and intact distal pulses.   Pulmonary/Chest: Effort normal and breath sounds normal.  Genitourinary: Uterus normal. Pelvic exam was performed with patient prone. There is no rash, tenderness or lesion on the right labia. There is no rash, tenderness or lesion on the left labia. Cervix exhibits no motion tenderness, no discharge and no friability. Vaginal discharge (scant bloody discharge ) found.  Musculoskeletal: She exhibits no edema.  Lymphadenopathy:       Right: No inguinal adenopathy present.       Left: No inguinal adenopathy present.  Skin: Skin is warm and dry. No rash noted.  Lab Results  Component Value Date   HGBA1C 9.9 03/02/2017   CBG 171   Treated high BP with clonidine 0.2 mg PO x 1 Repeat BP 146/82  Assessment & Plan:  [name] was seen today for gynecologic exam.  Diagnoses and all orders for this visit:  Pap smear for cervical cancer screening -     Cytology - PAP  Uncontrolled type 2 diabetes mellitus with complication, without long-term current use of insulin (HCC) -     POCT glucose (manual entry) -     POCT glycosylated hemoglobin (Hb A1C) -     sitaGLIPtin-metformin (JANUMET) 50-1000 MG tablet; Take 1 tablet by mouth 2 (two) times daily  with a meal.  Essential hypertension -     cloNIDine (CATAPRES) tablet 0.2 mg; Take 2 tablets (0.2 mg total) by mouth once. -     losartan-hydrochlorothiazide (HYZAAR) 50-12.5 MG tablet; Take 2 tablets by mouth daily.   There are no diagnoses linked to this encounter.  No orders of the defined types were placed in this encounter.   Follow-up: No Follow-up on file.   Boykin Nearing MD

## 2017-03-02 NOTE — Assessment & Plan Note (Signed)
Uncontrolled with headache  Plan: Decrease salt in diet Increase hyzaar to 100-25 mg daily Close f/u for recheck

## 2017-03-02 NOTE — Assessment & Plan Note (Signed)
Improved Add glipizide 5 mg BID Goal A1c < 7

## 2017-03-03 LAB — HIV ANTIBODY (ROUTINE TESTING W REFLEX): HIV: NONREACTIVE

## 2017-03-06 LAB — CYTOLOGY - PAP
CANDIDA VAGINITIS: NEGATIVE
Chlamydia: NEGATIVE
Diagnosis: NEGATIVE
HPV: NOT DETECTED
NEISSERIA GONORRHEA: NEGATIVE
Trichomonas: NEGATIVE

## 2017-03-07 ENCOUNTER — Ambulatory Visit: Payer: Medicare Other | Admitting: Occupational Therapy

## 2017-03-07 DIAGNOSIS — R293 Abnormal posture: Secondary | ICD-10-CM | POA: Diagnosis not present

## 2017-03-07 DIAGNOSIS — IMO0002 Reserved for concepts with insufficient information to code with codable children: Secondary | ICD-10-CM

## 2017-03-07 DIAGNOSIS — G8111 Spastic hemiplegia affecting right dominant side: Secondary | ICD-10-CM | POA: Diagnosis not present

## 2017-03-07 DIAGNOSIS — M6281 Muscle weakness (generalized): Secondary | ICD-10-CM | POA: Diagnosis not present

## 2017-03-07 DIAGNOSIS — M25512 Pain in left shoulder: Secondary | ICD-10-CM | POA: Diagnosis not present

## 2017-03-07 DIAGNOSIS — R278 Other lack of coordination: Secondary | ICD-10-CM | POA: Diagnosis not present

## 2017-03-07 DIAGNOSIS — I639 Cerebral infarction, unspecified: Secondary | ICD-10-CM | POA: Diagnosis not present

## 2017-03-07 NOTE — Patient Instructions (Signed)
    Do set of activities provided using Rt hand for 30 minutes a day with oven mitt on Lt hand (handout provided)  Do NOT use Rt hand for anything sharp, breakable, hot, or heavy. Do NOT use Rt hand for driving

## 2017-03-07 NOTE — Therapy (Signed)
[facility] [address] [place], [place], [address] Phone: [phone]   Fax:  [phone]  Occupational Therapy Treatment  Patient Details  Name: [name] MRN: [id number] Date of Birth: 1966/08/15 Referring Provider: Dr. Boy[place] Nearing  Encounter Date: 03/07/2017      OT End of Session - 03/07/17 1307    Visit Number 3   Number of Visits 17   Date for OT Re-Evaluation 04/22/17   Authorization Type MCR - G code needed   Authorization - Visit Number 3   Authorization - Number of Visits 10   OT Start Time 0805   OT Stop Time 0850   OT Time Calculation (min) 45 min   Activity Tolerance Patient tolerated treatment well      Past Medical History:  Diagnosis Date  . Diabetes mellitus without complication ([place]) 0174   previously on [place]   . Gestational diabetes 2009   . Hypertension 09/07/2014  . Stroke ([place]) 09/07/2014   R side. weakness in upper arm. No deficit in leg. deficit in speech. A littel bit in coordination.     Past Surgical History:  Procedure Laterality Date  . BREAST SURGERY    . CESAREAN SECTION    . TUBAL LIGATION  2009    There were no vitals filed for this visit.      Subjective Assessment - 03/07/17 0811    Subjective  It's just overusing the Lt arm (re: pain)    Pertinent History CVA 09/07/2014 with Rt dominant residual spastic hemiplegia   Currently in Pain? Yes   Pain Score 7    Pain Location Shoulder  and back   Pain Orientation Left   Pain Descriptors / Indicators Aching   Pain Type Chronic pain   Pain Onset More than a month ago   Pain Frequency Constant   Aggravating Factors  overuse LUE   Pain Relieving Factors rest, massage, whirlpool at [facility]                      OT Treatments/Exercises (OP) - 03/07/17 0001      ADLs   ADL Comments Pt shown cutting board and how to use. Pt provided with handouts for: shoe buttons, pot stabalizer, one handed kit  including: rocker knife, one handed cutting board, one handed jar opener, and one handed can opener. Pt instructed in how/where to purchase. Pt also provided with handout on what to avoid with RUE (see pt instructions for details)      Functional Reaching Activities   Low Level Pt provided with SAFE functional activities to do with RUE while Lt hand constrained with oven mitt. Pt demo each     Splinting   Splinting Pt issued Oval 8's (size 10 and 11) for index and long finger for PIP extension to wear during certain activities only. Pt instructed not to wear at night or all the time.                 OT Education - 03/07/17 [phone]    Education provided Yes   Education Details m-CIMT activties, things to avoid with RUE for safety reasons, A/E recommendations, Oval 8's wear and care for index and long finger PIP extension   Person(s) Educated Patient   Methods Explanation;Handout   Comprehension Verbalized understanding;Returned demonstration          OT Short Term Goals - 03/01/17 1230      OT SHORT TERM GOAL #  1   Title Independent with initial HEP for functional safe activities for Rt hand (due 03/23/17)    Time 4   Period Weeks   Status On-going     OT SHORT TERM GOAL #2   Title Pt to verbalize understanding with safety techniques d/t loss of sensation Rt hand   Time 4   Status On-going     OT SHORT TERM GOAL #3   Title Pt to verbalize understanding with A/E to increase ease and safety with ADLS/IADLS   Time 4   Period Weeks   Status On-going           OT Long Term Goals - 02/21/17 1124      OT LONG TERM GOAL #1   Title Independent with updated HEP prn following botox (due 04/22/17)    Time 8   Period Weeks   Status New     OT LONG TERM GOAL #2   Title Pt to improve RUE functional use as evidenced by performing 20 blocks or greater on Box & Blocks test   Baseline eval = 12 (Lt = 53)    Time 8   Period Weeks   Status New     OT LONG TERM GOAL #3   Title  Pt independent with splint wear and care following botox (finger splints for PIP ext Rt index and long finger)    Time 8   Period Weeks   Status New     OT LONG TERM GOAL #4   Title Pt to use RUE as gross assist for all BADLS and bilateral tasks   Time 8   Period Weeks   Status New               Plan - 03/07/17 1310    Clinical Impression Statement Pt making progress towards STG's. Pt with increased understanding of functional safe activities pt can do with RUE, and A/E needs for increased ease and safety with ADLS   Rehab Potential Good   OT Frequency 2x / week   OT Duration 8 weeks   OT Treatment/Interventions Self-care/ADL training;DME and/or AE instruction;Splinting;Patient/family education;Moist Heat;Therapeutic exercises;Therapeutic activities;Cryotherapy;Neuromuscular education;Passive range of motion;Cognitive remediation/compensation;Electrical Stimulation;Manual Therapy   Plan Practice use of shoe buttons, issue foam for utensils prn, continue functional use RUE   Consulted and Agree with Plan of Care Patient      Patient will benefit from skilled therapeutic intervention in order to improve the following deficits and impairments:  Decreased coordination, Decreased range of motion, Impaired flexibility, Impaired sensation, Impaired tone, Decreased knowledge of precautions, Decreased knowledge of use of DME, Impaired UE functional use, Pain, Decreased strength, Decreased cognition  Visit Diagnosis: 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 [facility])  Other lack of coordination    Problem List Patient Active Problem List   Diagnosis Date Noted  . Contracture of joint of finger of right hand 02/02/2017  . Right hand weakness 02/02/2017  . CVA (cerebral vascular accident) ([place]) 09/29/2015  . HTN (hypertension) 09/29/2015  . Diabetes mellitus type 2, uncontrolled, with complications ([place]) 44/81/8563  . Tinea pedis 09/29/2015  . Eczema  09/29/2015    Carey Bullocks, OTR/L 03/07/2017, 1:13 PM  [facility] [address] [place], [place], [address] Phone: [phone]   Fax:  [phone]  Name: [name] MRN: [id number] Date of Birth: [birth year]

## 2017-03-09 ENCOUNTER — Encounter: Payer: Self-pay | Admitting: Occupational Therapy

## 2017-03-09 ENCOUNTER — Telehealth: Payer: Self-pay

## 2017-03-09 ENCOUNTER — Ambulatory Visit: Payer: Medicare Other | Admitting: Occupational Therapy

## 2017-03-09 DIAGNOSIS — M25512 Pain in left shoulder: Secondary | ICD-10-CM | POA: Diagnosis not present

## 2017-03-09 DIAGNOSIS — R293 Abnormal posture: Secondary | ICD-10-CM | POA: Diagnosis not present

## 2017-03-09 DIAGNOSIS — R278 Other lack of coordination: Secondary | ICD-10-CM

## 2017-03-09 DIAGNOSIS — IMO0002 Reserved for concepts with insufficient information to code with codable children: Secondary | ICD-10-CM

## 2017-03-09 DIAGNOSIS — M6281 Muscle weakness (generalized): Secondary | ICD-10-CM | POA: Diagnosis not present

## 2017-03-09 DIAGNOSIS — I639 Cerebral infarction, unspecified: Secondary | ICD-10-CM | POA: Diagnosis not present

## 2017-03-09 DIAGNOSIS — G8111 Spastic hemiplegia affecting right dominant side: Secondary | ICD-10-CM | POA: Diagnosis not present

## 2017-03-09 NOTE — Therapy (Signed)
[facility] [address] [place], [place], [address] Phone: [phone]   Fax:  [phone]  Occupational Therapy Treatment  Patient Details  Name: MONIA TIMMERS MRN: [id number] Date of Birth: 03-25-1966 Referring Provider: Dr. Dessa Phi  Encounter Date: 03/09/2017      OT End of Session - 03/09/17 1614    Visit Number 4   Number of Visits 17   Date for OT Re-Evaluation 04/22/17   Authorization Type MCR - G code needed   Authorization - Visit Number 4   Authorization - Number of Visits 10   OT Start Time 0802   OT Stop Time 0845   OT Time Calculation (min) 43 min   Activity Tolerance Patient tolerated treatment well      Past Medical History:  Diagnosis Date  . Diabetes mellitus without complication (HCC) 2013   previously on victoza   . Gestational diabetes 2009   . Hypertension 09/07/2014  . Stroke (HCC) 09/07/2014   R side. weakness in upper arm. No deficit in leg. deficit in speech. A littel bit in coordination.     Past Surgical History:  Procedure Laterality Date  . BREAST SURGERY    . CESAREAN SECTION    . TUBAL LIGATION  2009    There were no vitals filed for this visit.      Subjective Assessment - 03/09/17 0805    Subjective  My left shouder is really bothering me   Pertinent History CVA 09/07/2014 with Rt dominant residual spastic hemiplegia   Currently in Pain? Yes   Pain Score 4    Pain Location Shoulder   Pain Orientation Left   Pain Descriptors / Indicators Aching;Dull   Pain Type Chronic pain   Pain Onset 1 to 4 weeks ago  states she has had it for 3 weeks going on 4   Aggravating Factors  overuse of LUE, when I am driving, reaching overhead and with abduction   Pain Relieving Factors rest, massage, whirlpoot at [facility]                      OT Treatments/Exercises (OP) - 03/09/17 0001      ADLs   Overall ADLs Addressed bed positioning in supine and in R  sidelying to address L shoulder pain.  Pt reported positioning felt much better on her L shoulder.  Pt states L shoulder pain worse upon waking in the am.  Pt able to return demonstrate positioning. Also discussed benefit of icing.  Pt verbalized understanding.      Exercises   Exercises Shoulder     Shoulder Exercises: Supine   Other Supine Exercises Pt instructed in gentle stretching exercises to reduce tightness/pain in L shoulder as well as neck.       Manual Therapy   Manual Therapy Joint mobilization;Soft tissue mobilization;Scapular mobilization   Manual therapy comments joint, soft tissue and scap mob to LUE to reduce shoulder pain. Pt reports and is evident that L shoulder is inflammed from overuse.  Pt reports signficant tenderness/discomfort along L deltoid tendon.  Also used light joint compression to decrease pain.  Pt tolerated all well                  OT Short Term Goals - 03/09/17 1607      OT SHORT TERM GOAL #1   Title Independent with initial HEP for functional safe activities for Rt hand (due 03/23/17)    Time  4   Period Weeks   Status On-going     OT SHORT TERM GOAL #2   Title Pt to verbalize understanding with safety techniques d/t loss of sensation Rt hand   Time 4   Status On-going     OT SHORT TERM GOAL #3   Title Pt to verbalize understanding with A/E to increase ease and safety with ADLS/IADLS   Time 4   Period Weeks   Status On-going           OT Long Term Goals - 03/09/17 1612      OT LONG TERM GOAL #1   Title Independent with updated HEP prn following botox (due 04/22/17)    Time 8   Period Weeks   Status New     OT LONG TERM GOAL #2   Title Pt to improve RUE functional use as evidenced by performing 20 blocks or greater on Box & Blocks test   Baseline eval = 12 (Lt = 53)    Time 8   Period Weeks   Status New     OT LONG TERM GOAL #3   Title Pt independent with splint wear and care following botox (finger splints for PIP ext Rt  index and long finger)    Time 8   Period Weeks   Status New     OT LONG TERM GOAL #4   Title Pt to use RUE as gross assist for all BADLS and bilateral tasks   Time 8   Period Weeks   Status New               Plan - 03/09/17 1612    Clinical Impression Statement Pt making progress toward goals.  Pt reports she is using her right hand more at home. Pt reported signficant reduction in pain in L shoulder after session today (4/10 to 1/10)   Rehab Potential Good   OT Frequency 2x / week   OT Duration 8 weeks   OT Treatment/Interventions Self-care/ADL training;DME and/or AE instruction;Splinting;Patient/family education;Moist Heat;Therapeutic exercises;Therapeutic activities;Cryotherapy;Neuromuscular education;Passive range of motion;Cognitive remediation/compensation;Electrical Stimulation;Manual Therapy   Plan Practice use of shoe buttons, issue foam for utensils, prn,continue functional use of RUE   Consulted and Agree with Plan of Care Patient      Patient will benefit from skilled therapeutic intervention in order to improve the following deficits and impairments:  Decreased coordination, Decreased range of motion, Impaired flexibility, Impaired sensation, Impaired tone, Decreased knowledge of precautions, Decreased knowledge of use of DME, Impaired UE functional use, Pain, Decreased strength, Decreased cognition  Visit Diagnosis: 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 [facility](HCC)  Other lack of coordination    Problem List Patient Active Problem List   Diagnosis Date Noted  . Contracture of joint of finger of right hand 02/02/2017  . Right hand weakness 02/02/2017  . CVA (cerebral vascular accident) (HCC) 09/29/2015  . HTN (hypertension) 09/29/2015  . Diabetes mellitus type 2, uncontrolled, with complications (HCC) 09/29/2015  . Tinea pedis 09/29/2015  . Eczema 09/29/2015    Norton PastelPulaski, Caress Reffitt Halliday, OTR/L 03/09/2017, 4:16 PM  [facility] [address] [place], [place], [address] Phone: [phone]   Fax:  [phone]  Name: Guy SandiferSylvia L Radman MRN: [id number] Date of Birth: 1966-04-08

## 2017-03-09 NOTE — Patient Instructions (Signed)
For pain in Left shoulder we discussed/practiced:   1. Bed positioning 2. Moist heat mid day followed by activity 3. Ice over the specific area of irritation 2-3 times per day for about 3-5 minutes (ice in a cup) 4. Stretching the left shoulder 5. Increasing use of the right arm with the activities [place] gave you

## 2017-03-09 NOTE — Telephone Encounter (Signed)
Pt was called and informed of lab results and medication being sent to pharmacy. 

## 2017-03-13 ENCOUNTER — Ambulatory Visit: Payer: Medicare Other | Admitting: Occupational Therapy

## 2017-03-13 ENCOUNTER — Encounter: Payer: Self-pay | Admitting: Occupational Therapy

## 2017-03-13 DIAGNOSIS — R293 Abnormal posture: Secondary | ICD-10-CM | POA: Diagnosis not present

## 2017-03-13 DIAGNOSIS — M6281 Muscle weakness (generalized): Secondary | ICD-10-CM

## 2017-03-13 DIAGNOSIS — I639 Cerebral infarction, unspecified: Secondary | ICD-10-CM | POA: Diagnosis not present

## 2017-03-13 DIAGNOSIS — R278 Other lack of coordination: Secondary | ICD-10-CM

## 2017-03-13 DIAGNOSIS — M25512 Pain in left shoulder: Secondary | ICD-10-CM | POA: Diagnosis not present

## 2017-03-13 DIAGNOSIS — IMO0002 Reserved for concepts with insufficient information to code with codable children: Secondary | ICD-10-CM

## 2017-03-13 DIAGNOSIS — G8111 Spastic hemiplegia affecting right dominant side: Secondary | ICD-10-CM | POA: Diagnosis not present

## 2017-03-13 NOTE — Patient Instructions (Signed)
Things to work on with your right hand!  1. You have red foam.  Place on utensils to make it easier to eat (fork and spoon).  Set your first goal to try and eat at least 50% of your meal with your right hand. Use shelf liner under the plate to keep it from sliding.  2. Use the red foam on your toothbrush and try and brush your teeth with your right hand. Do it in front of a mirror.  Then if you want to you can finish with your left hand to ensure you got them clean!  3. Use your right hand to wash in the shower - try and and wash your full body with your right hand  4.  Shoe buttons -[facility]  5. Adapted cutting board - you took a picture.

## 2017-03-13 NOTE — Therapy (Signed)
[facility] [address] [place], [place], [address] Phone: [phone]   Fax:  [phone]  Occupational Therapy Treatment  Patient Details  Name: [name] MRN: [id number] Date of Birth: 09/04/1966 Referring Provider: Dr. Dessa PhiJosalyn Funches  Encounter Date: 03/13/2017      OT End of Session - 03/13/17 0916    Visit Number 5   Number of Visits 17   Date for OT Re-Evaluation 04/22/17   Authorization Type MCR - G code needed   Authorization - Visit Number 5   Authorization - Number of Visits 10   OT Start Time 0803   OT Stop Time 0847   OT Time Calculation (min) 44 min   Activity Tolerance Patient tolerated treatment well      Past Medical History:  Diagnosis Date  . Diabetes mellitus without complication (HCC) 2013   previously on victoza   . Gestational diabetes 2009   . Hypertension 09/07/2014  . Stroke (HCC) 09/07/2014   R side. weakness in upper arm. No deficit in leg. deficit in speech. A littel bit in coordination.     Past Surgical History:  Procedure Laterality Date  . BREAST SURGERY    . CESAREAN SECTION    . TUBAL LIGATION  2009    There were no vitals filed for this visit.      Subjective Assessment - 03/13/17 0805    Subjective  I slept so good last night that positioning really helped   Pertinent History CHECK BP!!!! CVA 09/07/2014 with Rt dominant residual spastic hemiplegia   Currently in Pain? Yes   Pain Score 1    Pain Location Shoulder   Pain Orientation Left   Pain Descriptors / Indicators Aching;Dull   Pain Type Chronic pain   Pain Onset 1 to 4 weeks ago   Aggravating Factors  overuse of LUE, when I am drving, reaching overhead and with abduction   Pain Relieving Factors bed positioning helped tremendously!                      OT Treatments/Exercises (OP) - 03/13/17 0001      ADLs   ADL Comments Reviewed shoe buttons, practiced eating with fork and spoon  with red foam, red foam to brush teeth, practiced using R hand to with simulated bathing for shower. Dicussed need for patient to look at hand initially to make sure her hand position is appropriate for use of item as light touch and proprioception are impaired.  With practice pt improved in ability to use vision to compensate and to "set" hand appropriately for task she is initiating.  Also demostrated use of adapted cutting board - pt took picture of board and stated "my father can make me that."  Strongly enouraged pt to use L hand to cut due to sensory impairments and safety. Pt verbalized understanding.  Pt tearful today and stated "I haven't been able to use my rigth hand at all since my stroke. I can't believe I can eat with it."  Pt very motivated to improve functional use.  Pt given recap of session in writing due to memory issues since her stroke. Pt verbalized understanding of all information.                 OT Education - 03/13/17 [phone]    Education provided Yes   Education Details AE and adaptation to allow increased use of R hand for basic self care and basic  IADL tasks.    Person(s) Educated Patient   Methods Explanation;Demonstration;Verbal cues;Handout   Comprehension Verbalized understanding;Returned demonstration          OT Short Term Goals - 03/13/17 0981      OT SHORT TERM GOAL #1   Title Independent with initial HEP for functional safe activities for Rt hand (due 03/23/17)    Time 4   Period Weeks   Status On-going     OT SHORT TERM GOAL #2   Title Pt to verbalize understanding with safety techniques d/t loss of sensation Rt hand   Time 4   Status On-going     OT SHORT TERM GOAL #3   Title Pt to verbalize understanding with A/E to increase ease and safety with ADLS/IADLS   Time 4   Period Weeks   Status Achieved     OT SHORT TERM GOAL #4   Title Pt will demonstrate ability to eat at least 50% of her meals with her R hand   Status New     OT SHORT  TERM GOAL #5   Title Pt will demonstrate abilty to pick up small objects with R hand (2 pt and 3 pt pinch) for functional tasks   Status New     OT SHORT TERM GOAL #6   Title Pt will rate pain in L shoulder no more than 2/10 due to overuse issues with functional tasks.   Status New     OT SHORT TERM GOAL #7   Title Pt will demonstate ability to open jars with greater ease.   Status New           OT Long Term Goals - 03/13/17 1914      OT LONG TERM GOAL #1   Title Independent with updated HEP due 04/22/17)    Time 8   Period Weeks   Status On-going     OT LONG TERM GOAL #2   Title Pt to improve RUE functional use as evidenced by performing 20 blocks or greater on Box & Blocks test   Baseline eval = 12 (Lt = 53)    Time 8   Period Weeks   Status On-going     OT LONG TERM GOAL #3   Title Pt independent with splint wear and care prn for R fingers PRN (pt may not need splints)   Time 8   Period Weeks   Status Revised     OT LONG TERM GOAL #4   Title Pt to use RUE as gross assist for all BADLS and bilateral tasks   Time 8   Period Weeks   Status On-going     OT LONG TERM GOAL #5   Title Pt will be able to print her name with R hand with AE prn   Status New     Long Term Additional Goals   Additional Long Term Goals Yes     OT LONG TERM GOAL #6   Title Pt will be able to eat at least 75% of her meals with her right hand.   Status New     OT LONG TERM GOAL #7   Title Pt will demonstrate ability to use RUE for functional tasks with min compensations for reach patterns and hand patterns.    Status New               Plan - 03/13/17 0911    Clinical Impression Statement Pt making excellent progress toward goals. Pt tearful today  stating "I haven't been able to use my right hand like this since the stroke"   Rehab Potential Good   OT Frequency 2x / week   OT Duration 8 weeks   OT Treatment/Interventions Self-care/ADL training;DME and/or AE  instruction;Splinting;Patient/family education;Moist Heat;Therapeutic exercises;Therapeutic activities;Cryotherapy;Neuromuscular education;Passive range of motion;Cognitive remediation/compensation;Electrical Stimulation;Manual Therapy   Plan manul for R hand for tightness, address pinch, grasp and release with R hand, address functional reach with RUE with hand function   Consulted and Agree with Plan of Care Patient      Patient will benefit from skilled therapeutic intervention in order to improve the following deficits and impairments:  Decreased coordination, Decreased range of motion, Impaired flexibility, Impaired sensation, Impaired tone, Decreased knowledge of precautions, Decreased knowledge of use of DME, Impaired UE functional use, Pain, Decreased strength, Decreased cognition  Visit Diagnosis: 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 [facility]) - Plan: Ot plan of care cert/re-cert  Other lack of coordination - Plan: Ot plan of care cert/re-cert  Muscle weakness (generalized) - Plan: Ot plan of care cert/re-cert  Acute pain of left shoulder - Plan: Ot plan of care cert/re-cert  Abnormal posture - Plan: Ot plan of care cert/re-cert    Problem List Patient Active Problem List   Diagnosis Date Noted  . Contracture of joint of finger of right hand 02/02/2017  . Right hand weakness 02/02/2017  . CVA (cerebral vascular accident) (HCC) 09/29/2015  . HTN (hypertension) 09/29/2015  . Diabetes mellitus type 2, uncontrolled, with complications (HCC) 09/29/2015  . Tinea pedis 09/29/2015  . Eczema 09/29/2015    Norton Pastel, OTR/L 03/13/2017, 9:21 AM  [facility] [address] [place], [place], [address] Phone: [phone]   Fax:  [phone]  Name: [name] MRN: [id number] Date of Birth: Apr 26, 1966

## 2017-03-14 ENCOUNTER — Encounter: Payer: Self-pay | Admitting: Physical Medicine & Rehabilitation

## 2017-03-14 ENCOUNTER — Ambulatory Visit (HOSPITAL_BASED_OUTPATIENT_CLINIC_OR_DEPARTMENT_OTHER): Payer: Medicare Other | Admitting: Physical Medicine & Rehabilitation

## 2017-03-14 ENCOUNTER — Ambulatory Visit: Payer: Medicare Other | Admitting: Occupational Therapy

## 2017-03-14 ENCOUNTER — Encounter: Payer: Medicare Other | Attending: Physical Medicine & Rehabilitation

## 2017-03-14 VITALS — BP 142/61 | HR 88

## 2017-03-14 DIAGNOSIS — M24541 Contracture, right hand: Secondary | ICD-10-CM | POA: Diagnosis not present

## 2017-03-14 DIAGNOSIS — I69920 Aphasia following unspecified cerebrovascular disease: Secondary | ICD-10-CM | POA: Diagnosis not present

## 2017-03-14 DIAGNOSIS — I69322 Dysarthria following cerebral infarction: Secondary | ICD-10-CM | POA: Diagnosis not present

## 2017-03-14 DIAGNOSIS — G8320 Monoplegia of upper limb affecting unspecified side: Secondary | ICD-10-CM | POA: Diagnosis not present

## 2017-03-14 DIAGNOSIS — I6300 Cerebral infarction due to thrombosis of unspecified precerebral artery: Secondary | ICD-10-CM

## 2017-03-14 MED ORDER — TIZANIDINE HCL 2 MG PO TABS: 2.0000 mg | ORAL_TABLET | Freq: Two times a day (BID) | ORAL | 0 refills | Status: DC

## 2017-03-14 MED FILL — tiZANidine HCL 2 MG TABS: 2 | 30 days supply | Qty: 60 | Fill #0

## 2017-03-14 NOTE — Progress Notes (Signed)
Subjective:    Patient ID: [name], female    DOB: 04-27-1966, 51 y.o.   MRN: [id number] PCP Dr. Armen PickupFunches HPI  51 year old female with left CVA in 2015, resulting in right spastic hemiplegia. She was [facility] at [facility] and underwent inpatient rehabilitation [facility] in [place]. Did for outpatient visits in [place]. [place]. Stroke risk factors include hypertension and diabetes.  [facility] on [facility]/21/2017 with occupational therapy  Patient states that the stroke did not affect her right lower extremity. She still has some speech difficulties. She states that she has problems with articulation as well as word finding  Chronic left shoulder pain, overuse. Currently, not very painful, however Pain Inventory Average Pain 1 Pain Right Now 1 My pain is .  In the last 24 hours, has pain interfered with the following? General activity 1 Relation with others 1 Enjoyment of life 1 What TIME of day is your pain at its worst? morning Sleep (in general) Fair  Pain is worse with: some activites Pain improves with: heat/ice Relief from Meds: .  Mobility walk without assistance ability to climb steps?  yes do you drive?  yes  Function disabled: date disabled 813-18  Neuro/Psych depression  Prior Studies Any changes since last visit?  no MRI HEAD WITHOUT CONTRAST  TECHNIQUE: Multiplanar, multiecho pulse sequences of the brain and surrounding structures were obtained without intravenous contrast.  COMPARISON:  Prior CT from earlier the same day.  FINDINGS: Overall cerebral volume within normal limits for patient age.  Multi focal areas of encephalomalacia with gliosis involve the left frontal and parietal lobes, consistent with remote left MCA territory infarct. Associated remote lacunar infarctions within the left holiday. There is  scattered areas of laminar necrosis within these regions. 9 mm diffusion abnormality within the left frontal lobe with an an area of encephalomalacia favored to be related to laminar necrosis rather than true acute infarction (Series 4, image 34).  No other abnormal foci of restricted diffusion to suggest acute infarct. Gray-white matter differentiation otherwise maintained. Major intracranial vascular flow voids are preserved. No acute intracranial hemorrhage. No areas of chronic hemorrhage within the brain.  No mass lesion, mass effect, or midline shift. No hydrocephalus. Slight asymmetry within the lateral ventricles, with the right smaller than the left noted, of doubtful clinical significance. No extra-axial fluid collection. Major dural sinuses are grossly patent.  Craniocervical junction within normal limits. Visualized upper cervical spine unremarkable.  Incidental note made of an empty sella, of doubtful clinical significance. No acute abnormality about the orbits. Question of minimal flattening at the posterior aspect of the right lobe (series 6, image 10). Minimally increased CSF fluid density along the optic nerve sheaths.  Paranasal sinuses are clear. No mastoid effusion. Inner ear structures normal.  Bone marrow signal intensity within normal limits. No scalp soft tissue abnormality.  IMPRESSION: 1. No acute intracranial infarct or other process identified. 2. Sequela of remote left MCA territory infarction. 9 mm diffusion signal abnormality within the left frontal lobe felt to be related to encephalomalacia/laminar necrosis. 3. Empty sella, with question of subtle flattening of the posterior aspect of the right globe. While these findings may be incidental and of no clinical significance, this may reflect the sequelae of underlying intracranial hypertension. Further workup and correlation of CSF pressures could be performed as clinically  indicated.   Electronically Signed   By: Janell QuietBenjamin  McClintock M.D.  On: 04/19/2016 22:37 Physicians involved in your care Any changes since last visit?  no   Family History  Problem Relation Age of Onset  . Diabetes Mother   . Hypertension Mother   . Hypertension Sister    Social History   Social History  . Marital status: Married    Spouse name: N/A  . Number of children: N/A  . Years of education: N/A   Social History Main Topics  . Smoking status: Never Smoker  . Smokeless tobacco: Never Used  . Alcohol use No  . Drug use: No  . Sexual activity: Not Asked   Other Topics Concern  . 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 BREAST SURGERY    . CESAREAN SECTION    . TUBAL LIGATION  2009   Past Medical History:  Diagnosis Date  . Diabetes mellitus without complication (HCC) 2013   previously on victoza   . Gestational diabetes 2009   . Hypertension 09/07/2014  . Stroke (HCC) 09/07/2014   R side. weakness in upper arm. No deficit in leg. deficit in speech. A littel bit in coordination.    BP (!) 142/61   Pulse 88   LMP 02/23/2017   SpO2 98%   Opioid Risk Score:   Fall Risk Score:  `1  Depression screen PHQ 2/9  Depression screen [facility] 2/9 03/02/2017 10/13/2016 09/29/2015  Decreased Interest 0 3 1  Down, Depressed, Hopeless 1 2 0  PHQ - 2 Score 1 5 1   Altered sleeping 1 2 -  Tired, decreased energy 3 0 -  Change in appetite 0 0 -  Feeling bad or failure about yourself  0 0 -  Trouble concentrating 0 0 -  Moving slowly or fidgety/restless 0 0 -  Suicidal thoughts 0 0 -  PHQ-9 Score 5 7 -     Review of Systems  Constitutional: Negative.   HENT: Negative.   Eyes: Negative.   Respiratory: Negative.   Cardiovascular: Negative.   Gastrointestinal: Negative.   Endocrine: Negative.   Genitourinary: Negative.   Musculoskeletal: Negative.   Skin:  Negative.   Allergic/Immunologic: Negative.   Neurological: Negative.   Hematological: Negative.   Psychiatric/Behavioral: Negative.   All other systems reviewed and are negative.      Objective:   Physical Exam  Constitutional: She is oriented to person, place, and time. She appears well-developed and well-nourished.  HENT:  Head: Normocephalic and atraumatic.  Eyes: Conjunctivae and EOM are normal. Pupils are equal, round, and reactive to light.  Neck: Normal range of motion. Neck supple. No JVD present.  Cardiovascular: Normal rate.   Pulmonary/Chest: Effort normal and breath sounds normal. No respiratory distress. She has no wheezes.  Abdominal: Soft. Bowel sounds are normal. She exhibits no distension. There is no tenderness.  Neurological: She is alert and oriented to person, place, and time.  Speech with mild dysarthria  Her strength is 5/5 in the left deltoid, biceps, triceps, grip, bilateral hip flexor, knee extensor, ankle dorsiflexor  4/5 in the right deltoid, bicep, tricep, 3 minus, finger flexors 3 minus, finger extensors Third digit is with flexion contracture at the PIP and DIP. Second digit with flexion contracture at the PIP Modified Ashworth grade 3 spasticity in the pronators MAS 2. At the wrist flexors MAS of 3 at the third digit and fourth digit of the right hand MAS 1 at  the second and fourth as well as at the thumb  Sensation intact to light touch and pinprick in both upper and lower limbs.  Psychiatric: She has a normal mood and affect.  Nursing note and vitals reviewed.  Left shoulder. No pain with palpation. No swelling, negative impingement signs, normal range of motion. 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  Contracture RIght 2nd and 4th PIP Right 3rd PIP, DIP with some underlying spasticity of FDS, FDP   Would recommend orthotic reevaluation, has a ring splints on the third PIP on the right, may consider outrigger splint or resting hand splint, custom molded

## 2017-03-16 ENCOUNTER — Encounter: Payer: Medicare Other | Admitting: *Deleted

## 2017-03-16 ENCOUNTER — Ambulatory Visit: Payer: Medicare Other | Admitting: Occupational Therapy

## 2017-03-16 ENCOUNTER — Encounter: Payer: Self-pay | Admitting: Occupational Therapy

## 2017-03-16 VITALS — BP 145/82 | HR 100

## 2017-03-16 DIAGNOSIS — M6281 Muscle weakness (generalized): Secondary | ICD-10-CM | POA: Diagnosis not present

## 2017-03-16 DIAGNOSIS — M25512 Pain in left shoulder: Secondary | ICD-10-CM | POA: Diagnosis not present

## 2017-03-16 DIAGNOSIS — G8111 Spastic hemiplegia affecting right dominant side: Secondary | ICD-10-CM | POA: Diagnosis not present

## 2017-03-16 DIAGNOSIS — IMO0002 Reserved for concepts with insufficient information to code with codable children: Secondary | ICD-10-CM

## 2017-03-16 DIAGNOSIS — I639 Cerebral infarction, unspecified: Secondary | ICD-10-CM | POA: Diagnosis not present

## 2017-03-16 DIAGNOSIS — R278 Other lack of coordination: Secondary | ICD-10-CM | POA: Diagnosis not present

## 2017-03-16 DIAGNOSIS — R293 Abnormal posture: Secondary | ICD-10-CM | POA: Diagnosis not present

## 2017-03-16 NOTE — Therapy (Signed)
[facility] [address] [place], [place], [address] Phone: [phone]   Fax:  [phone]  Occupational Therapy Treatment  Patient Details  Name: Jordan SandiferSylvia L Hern MRN: [id number] Date of Birth: 1966-03-18 Referring Provider: Dr. Dessa PhiJosalyn Funches  Encounter Date: 03/16/2017      OT End of Session - 03/16/17 1547    Visit Number 6   Number of Visits 17   Date for OT Re-Evaluation 04/22/17   Authorization Type MCR - G code needed   Authorization - Visit Number 6   Authorization - Number of Visits 10   OT Start Time 1316   OT Stop Time 1359   OT Time Calculation (min) 43 min   Activity Tolerance Patient tolerated treatment well      Past Medical History:  Diagnosis Date  . Diabetes mellitus without complication (HCC) 2013   previously on victoza   . Gestational diabetes 2009   . Hypertension 09/07/2014  . Stroke (HCC) 09/07/2014   R side. weakness in upper arm. No deficit in leg. deficit in speech. A littel bit in coordination.     Past Surgical History:  Procedure Laterality Date  . BREAST SURGERY    . CESAREAN SECTION    . TUBAL LIGATION  2009    Vitals:   03/16/17 1321  BP: (!) 145/82  Pulse: 100        Subjective Assessment - 03/16/17 1319    Subjective  I am going on vacation next week   Pertinent History CHECK BP!!!! CVA 09/07/2014 with Rt dominant residual spastic hemiplegia   Currently in Pain? No/denies                      OT Treatments/Exercises (OP) - 03/16/17 0001      Splinting   Splinting Initiated fabrication of resting hand splint for night wear to decrease tone and provide prolonged stretch to fingers.  Pt tolerated fabrication well.  Will complete next visit and review donning, doffing, wear and care.  Pt verbalized understanding of rationale of splint.                   OT Short Term Goals - 03/16/17 1544      OT SHORT TERM GOAL #1   Title Independent  with initial HEP for functional safe activities for Rt hand (due 03/23/17)    Time 4   Period Weeks   Status On-going     OT SHORT TERM GOAL #2   Title Pt to verbalize understanding with safety techniques d/t loss of sensation Rt hand   Time 4   Status Achieved     OT SHORT TERM GOAL #3   Title Pt to verbalize understanding with A/E to increase ease and safety with ADLS/IADLS   Time 4   Period Weeks   Status Achieved     OT SHORT TERM GOAL #4   Title Pt will demonstrate ability to eat at least 50% of her meals with her R hand   Status On-going     OT SHORT TERM GOAL #5   Title Pt will demonstrate abilty to pick up small objects with R hand (2 pt and 3 pt pinch) for functional tasks   Status On-going     OT SHORT TERM GOAL #6   Title Pt will rate pain in L shoulder no more than 2/10 due to overuse issues with functional tasks.   Status On-going  OT SHORT TERM GOAL #7   Title Pt will demonstate ability to open jars with greater ease.   Status On-going           OT Long Term Goals - 03/16/17 1544      OT LONG TERM GOAL #1   Title Independent with updated HEP due 04/22/17)    Time 8   Period Weeks   Status On-going     OT LONG TERM GOAL #2   Title Pt to improve RUE functional use as evidenced by performing 20 blocks or greater on Box & Blocks test   Baseline eval = 12 (Lt = 53)    Time 8   Period Weeks   Status On-going     OT LONG TERM GOAL #3   Title Pt independent with splint wear and care prn for R fingers PRN (pt may not need splints)   Time 8   Period Weeks   Status On-going     OT LONG TERM GOAL #4   Title Pt to use RUE as gross assist for all BADLS and bilateral tasks   Time 8   Period Weeks   Status On-going     OT LONG TERM GOAL #5   Title Pt will be able to print her name with R hand with AE prn   Status On-going     OT LONG TERM GOAL #6   Title Pt will be able to eat at least 75% of her meals with her right hand.   Status On-going     OT  LONG TERM GOAL #7   Title Pt will demonstrate ability to use RUE for functional tasks with min compensations for reach patterns and hand patterns.    Status On-going               Plan - 03/16/17 1545    Clinical Impression Statement Pt making progress toward goals. Pt began on medication to reduce tightness/tone in  R hand.  Pt reports she is using her right hand to try and eat part of her meals at home as well as using it to bathe in shower.   Rehab Potential Good   OT Frequency 2x / week   OT Duration 8 weeks   OT Treatment/Interventions Self-care/ADL training;DME and/or AE instruction;Splinting;Patient/family education;Moist Heat;Therapeutic exercises;Therapeutic activities;Cryotherapy;Neuromuscular education;Passive range of motion;Cognitive remediation/compensation;Electrical Stimulation;Manual Therapy   Plan complete splint, wear and care, address pinch grasp and release with R hand, address functional reach with RUE with hand function, manual to address ROM/tightness in R hand   Consulted and Agree with Plan of Care Patient      Patient will benefit from skilled therapeutic intervention in order to improve the following deficits and impairments:  Decreased coordination, Decreased range of motion, Impaired flexibility, Impaired sensation, Impaired tone, Decreased knowledge of precautions, Decreased knowledge of use of DME, Impaired UE functional use, Pain, Decreased strength, Decreased cognition  Visit Diagnosis: 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 [facility])  Other lack of coordination  Muscle weakness (generalized)  Acute pain of left shoulder  Abnormal posture    Problem List Patient Active Problem List   Diagnosis Date Noted  . 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 Aphasia, late effect of cerebrovascular disease 03/14/2017  . Dysarthria as late effect of cerebrovascular accident (CVA) 03/14/2017  . Contracture of  joint of finger of right hand 02/02/2017  . Right hand weakness 02/02/2017  . CVA (cerebral vascular  accident) (HCC) 09/29/2015  . HTN (hypertension) 09/29/2015  . Diabetes mellitus type 2, uncontrolled, with complications (HCC) 09/29/2015  . Tinea pedis 09/29/2015  . Eczema 09/29/2015    Norton Pastel, OTR/L 03/16/2017, 3:48 PM  [facility] [address] [place], [place], [address] Phone: [phone]   Fax:  [phone]  Name: PATIENCE NUZZO MRN: [id number] Date of Birth: 1966-12-06

## 2017-03-28 ENCOUNTER — Encounter: Payer: Self-pay | Admitting: Occupational Therapy

## 2017-03-28 ENCOUNTER — Ambulatory Visit: Payer: Medicare Other | Attending: Family Medicine | Admitting: Occupational Therapy

## 2017-03-28 VITALS — BP 166/75 | HR 90

## 2017-03-28 DIAGNOSIS — R293 Abnormal posture: Secondary | ICD-10-CM

## 2017-03-28 DIAGNOSIS — G8111 Spastic hemiplegia affecting right dominant side: Secondary | ICD-10-CM | POA: Diagnosis not present

## 2017-03-28 DIAGNOSIS — M6281 Muscle weakness (generalized): Secondary | ICD-10-CM

## 2017-03-28 DIAGNOSIS — R278 Other lack of coordination: Secondary | ICD-10-CM | POA: Diagnosis not present

## 2017-03-28 DIAGNOSIS — M25512 Pain in left shoulder: Secondary | ICD-10-CM | POA: Diagnosis not present

## 2017-03-28 DIAGNOSIS — I639 Cerebral infarction, unspecified: Secondary | ICD-10-CM | POA: Diagnosis not present

## 2017-03-28 DIAGNOSIS — IMO0002 Reserved for concepts with insufficient information to code with codable children: Secondary | ICD-10-CM

## 2017-03-28 NOTE — Patient Instructions (Addendum)
Your Splint This splint should initially be fitted by a healthcare practitioner.  The healthcare practitioner is responsible for providing wearing instructions and precautions to the patient, other healthcare practitioners and care provider involved in the patient's care.  This splint was custom made for you. Please read the following instructions to learn about wearing and caring for your splint.  Precautions Should your splint cause any of the following problems, remove the splint immediately and contact your therapist/physician.  Swelling  Severe Pain  Pressure Areas  Stiffness  Numbness  Do not wear your splint while operating machinery unless it has been fabricated for that purpose.  When To Wear Your Splint Where your splint according to your therapist/physician instructions.  HAVE YOUR DAUGHTER OR HUSBAND HELP YOU PUT THE SPLINT ON.  YOUR JOB IS TO RELAX THE HAND WHILE THEY DO THAT!!  We made a video of how to put the splint on.    Day ONE: Tuesday:  You will wear your splint 2 hours in the morning and then 2 hours in the late afternoon. DO NOT SLEEP IN THE SPLINT YET.   If there are no issues you can then do:  Day TWO: Wednesday:  4 hours in the morning and then 4  hours in the late afternoon/early evening. DO NOT SLEEP IN THE SPLINT YET.  If there are no issues you can then do: Day Three: Thursday:  You can now sleep in it at night.  DO NOT WEAR IT DURING THE DAY anymore.  You will only wear it at night from this point forward.    Care and Cleaning of Your Splint 1. Keep your splint away from open flames. 2. Your splint will lose its shape in temperatures over 135 degrees Farenheit, ( in car windows, near radiators, ovens or in hot water).  Never make any adjustments to your splint, if the splint needs adjusting remove it and make an appointment to see your therapist. 3. Your splint, including the cushion liner may be cleaned with soap and lukewarm water.  Do not immerse in hot  water over 135 degrees Farenheit. 4. Straps may be washed with soap and water, but do not moisten the self-adhesive portion. 5. For ink or hard to remove spots use a scouring cleanser which contains chlorine.  Rinse the splint thoroughly after using chlorine cleanser.

## 2017-03-28 NOTE — Therapy (Signed)
[facility] [address] [place], [place], [address] Phone: [phone]   Fax:  [phone]  Occupational Therapy Treatment  Patient Details  Name: [name] MRN: [id number] Date of Birth: 01-Oct-1966 Referring Provider: Dr. Dessa Phi  Encounter Date: 03/28/2017      OT End of Session - 03/28/17 1026    Visit Number 7   Number of Visits 17   Date for OT Re-Evaluation 04/22/17   Authorization Type MCR - G code needed   Authorization - Visit Number 7   Authorization - Number of Visits 10   OT Start Time 0846   OT Stop Time 0929   OT Time Calculation (min) 43 min   Activity Tolerance Patient tolerated treatment well      Past Medical History:  Diagnosis Date  . Diabetes mellitus without complication (HCC) 2013   previously on victoza   . Gestational diabetes 2009   . Hypertension 09/07/2014  . Stroke (HCC) 09/07/2014   R side. weakness in upper arm. No deficit in leg. deficit in speech. A littel bit in coordination.     Past Surgical History:  Procedure Laterality Date  . BREAST SURGERY    . CESAREAN SECTION    . TUBAL LIGATION  2009    Vitals:   03/28/17 0852  BP: (!) 166/75  Pulse: 90        Subjective Assessment - 03/28/17 0849    Subjective  I had a great time in [place].     Pertinent History CHECK BP!!!! CVA 09/07/2014 with Rt dominant residual spastic hemiplegia   Currently in Pain? No/denies                      OT Treatments/Exercises (OP) - 03/28/17 0001      Neurological Re-education Exercises   Other Exercises 2 Neuro re ed to adddress isolated finger extension with palmer arch and MCP's supported to prevent clawing as well as facilitation for finger extension.  Discussed use of estim next session and pt in agreement.      Splinting   Splinting Completed resting hand splint. Pt will need assist to don splint due to increased tone and severe lack of sensation.  Pt  states dtr and husband can assist.  Made video clip on pt's phone showing how to don with narrative as family members were not present.  Pt able to verbalize understanding as well. Reviewed wear and care of splint and pt verbalized understanding.      Manual Therapy   Manual Therapy Soft tissue mobilization;Joint mobilization   Manual therapy comments Joint and soft tissue mob to index, middle and ring finger of R hand at MCP, DIP to improve PROM for extension with some success.  Discussed with pt that functionally she does not need complete and full extension to use R hand and after manipulation pt has PROM available for functional extension.  Followed by NMR see above.                 OT Education - 03/28/17 1021    Education provided Yes   Education Details splint wear and care   Person(s) Educated Patient   Methods Explanation;Demonstration;Verbal cues;Handout;Other (comment)  pt will need assist donning made video clip on pt's phone to demonstrate donning for dtr and husband. Pt can also verbalize steps of donning.   Comprehension Verbalized understanding  see notes above - husband and dtr to assist in donning  OT Short Term Goals - 03/28/17 1023      OT SHORT TERM GOAL #1   Title Independent with initial HEP for functional safe activities for Rt hand (due 03/23/17)    Time 4   Period Weeks   Status Achieved     OT SHORT TERM GOAL #2   Title Pt to verbalize understanding with safety techniques d/t loss of sensation Rt hand   Time 4   Status Achieved     OT SHORT TERM GOAL #3   Title Pt to verbalize understanding with A/E to increase ease and safety with ADLS/IADLS   Time 4   Period Weeks   Status Achieved     OT SHORT TERM GOAL #4   Title Pt will demonstrate ability to eat at least 50% of her meals with her R hand   Status Achieved     OT SHORT TERM GOAL #5   Title Pt will demonstrate abilty to pick up small objects with R hand (2 pt and 3 pt pinch) for  functional tasks   Status On-going     OT SHORT TERM GOAL #6   Title Pt will rate pain in L shoulder no more than 2/10 due to overuse issues with functional tasks.   Status Achieved     OT SHORT TERM GOAL #7   Title Pt will demonstate ability to open jars with greater ease.   Status On-going           OT Long Term Goals - 03/28/17 1023      OT LONG TERM GOAL #1   Title Independent with updated HEP due 04/22/17)    Time 8   Period Weeks   Status On-going     OT LONG TERM GOAL #2   Title Pt to improve RUE functional use as evidenced by performing 20 blocks or greater on Box & Blocks test   Baseline eval = 12 (Lt = 53)    Time 8   Period Weeks   Status On-going     OT LONG TERM GOAL #3   Title Pt independent with splint wear and care prn for R fingers PRN (pt may not need splints)   Time 8   Period Weeks   Status On-going     OT LONG TERM GOAL #4   Title Pt to use RUE as gross assist for all BADLS and bilateral tasks   Time 8   Period Weeks   Status On-going     OT LONG TERM GOAL #5   Title Pt will be able to print her name with R hand with AE prn   Status On-going     OT LONG TERM GOAL #6   Title Pt will be able to eat at least 75% of her meals with her right hand.   Status On-going     OT LONG TERM GOAL #7   Title Pt will demonstrate ability to use RUE for functional tasks with min compensations for reach patterns and hand patterns.    Status On-going               Plan - 03/28/17 1024    Clinical Impression Statement Pt progressing toward goals. Pt reports she is attempting to use R hand at home and is eating a large percent of meals with R hand. Pt also carying cell phone in R hand today.   Rehab Potential Good   OT Frequency 2x / week   OT Duration 8  weeks   Plan check remaining STG"s as pt was on vacation last week, check splint, address pinch and grasp/release (?estim),manual to address ROM/tightness in R hand, functional reach with hand  function.   Consulted and Agree with Plan of Care Patient      Patient will benefit from skilled therapeutic intervention in order to improve the following deficits and impairments:  Decreased coordination, Decreased range of motion, Impaired flexibility, Impaired sensation, Impaired tone, Decreased knowledge of precautions, Decreased knowledge of use of DME, Impaired UE functional use, Pain, Decreased strength, Decreased cognition  Visit Diagnosis: 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 [facility])  Other lack of coordination  Muscle weakness (generalized)  Acute pain of left shoulder  Abnormal posture    Problem List Patient Active Problem List   Diagnosis Date Noted  . 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 Aphasia, late effect of cerebrovascular disease 03/14/2017  . Dysarthria as late effect of cerebrovascular accident (CVA) 03/14/2017  . Contracture of joint of finger of right hand 02/02/2017  . Right hand weakness 02/02/2017  . CVA (cerebral vascular accident) (HCC) 09/29/2015  . HTN (hypertension) 09/29/2015  . Diabetes mellitus type 2, uncontrolled, with complications (HCC) 09/29/2015  . Tinea pedis 09/29/2015  . Eczema 09/29/2015    Norton Pastel, OTR/L 03/28/2017, 10:28 AM  [facility] [address] [place], [place], [address] Phone: [phone]   Fax:  [phone]  Name: KACHINA NIEDERER MRN: [id number] Date of Birth: September 19, 1966

## 2017-03-30 ENCOUNTER — Encounter: Payer: Self-pay | Admitting: Occupational Therapy

## 2017-03-30 ENCOUNTER — Ambulatory Visit: Payer: Medicare Other | Admitting: Occupational Therapy

## 2017-03-30 DIAGNOSIS — M25512 Pain in left shoulder: Secondary | ICD-10-CM | POA: Diagnosis not present

## 2017-03-30 DIAGNOSIS — M6281 Muscle weakness (generalized): Secondary | ICD-10-CM | POA: Diagnosis not present

## 2017-03-30 DIAGNOSIS — IMO0002 Reserved for concepts with insufficient information to code with codable children: Secondary | ICD-10-CM

## 2017-03-30 DIAGNOSIS — G8111 Spastic hemiplegia affecting right dominant side: Secondary | ICD-10-CM | POA: Diagnosis not present

## 2017-03-30 DIAGNOSIS — I639 Cerebral infarction, unspecified: Secondary | ICD-10-CM | POA: Diagnosis not present

## 2017-03-30 DIAGNOSIS — R293 Abnormal posture: Secondary | ICD-10-CM

## 2017-03-30 DIAGNOSIS — R278 Other lack of coordination: Secondary | ICD-10-CM | POA: Diagnosis not present

## 2017-03-30 NOTE — Therapy (Signed)
[facility] [address] [place], [place], [address] Phone: [phone]   Fax:  [phone]  Occupational Therapy Treatment  Patient Details  Name: [name] MRN: [id number] Date of Birth: 04/17/1966 Referring Provider: Dr. Dessa Phi  Encounter Date: 03/30/2017      OT End of Session - 03/30/17 1340    Visit Number 8   Number of Visits 17   Date for OT Re-Evaluation 04/22/17   Authorization Type MCR - G code needed   Authorization - Visit Number 8   Authorization - Number of Visits 10   OT Start Time 0847   OT Stop Time 0929   OT Time Calculation (min) 42 min   Activity Tolerance Patient tolerated treatment well      Past Medical History:  Diagnosis Date  . Diabetes mellitus without complication (HCC) 2013   previously on victoza   . Gestational diabetes 2009   . Hypertension 09/07/2014  . Stroke (HCC) 09/07/2014   R side. weakness in upper arm. No deficit in leg. deficit in speech. A littel bit in coordination.     Past Surgical History:  Procedure Laterality Date  . BREAST SURGERY    . CESAREAN SECTION    . TUBAL LIGATION  2009    There were no vitals filed for this visit.      Subjective Assessment - 03/30/17 0851    Subjective  The splint is doing really good I am going to wear it to sleep tonight.   Pertinent History CHECK BP!!!! CVA 09/07/2014 with Rt dominant residual spastic hemiplegia   Currently in Pain? No/denies                      OT Treatments/Exercises (OP) - 03/30/17 0001      Neurological Re-education Exercises   Other Exercises 2 Neuro re ed to address proximal strength and alignment for RUE - proximal weakness and malalignment contribute to distal functional use of LUE.  Pt's HEP updated and pt able return demonstrate all activities. HEP given in wriitng as well see pt instruction section for details.                 OT Education - 03/30/17  1334    Education provided Yes   Education Details upgraded HEP for RUE   Person(s) Educated Patient   Methods Explanation;Demonstration;Verbal cues;Handout   Comprehension Verbalized understanding;Returned demonstration          OT Short Term Goals - 03/30/17 1335      OT SHORT TERM GOAL #1   Title Independent with initial HEP for functional safe activities for Rt hand (due 03/23/17)    Time 4   Period Weeks   Status Achieved     OT SHORT TERM GOAL #2   Title Pt to verbalize understanding with safety techniques d/t loss of sensation Rt hand   Time 4   Status Achieved     OT SHORT TERM GOAL #3   Title Pt to verbalize understanding with A/E to increase ease and safety with ADLS/IADLS   Time 4   Period Weeks   Status Achieved     OT SHORT TERM GOAL #4   Title Pt will demonstrate ability to eat at least 50% of her meals with her R hand   Status Achieved     OT SHORT TERM GOAL #5   Title Pt will demonstrate abilty to pick up small objects with R hand (  2 pt and 3 pt pinch) for functional tasks   Status On-going     OT SHORT TERM GOAL #6   Title Pt will rate pain in L shoulder no more than 2/10 due to overuse issues with functional tasks.   Status Achieved     OT SHORT TERM GOAL #7   Title Pt will demonstate ability to open jars with greater ease.   Status On-going           OT Long Term Goals - 03/30/17 1335      OT LONG TERM GOAL #1   Title Independent with updated HEP due 04/22/17)    Time 8   Period Weeks   Status On-going     OT LONG TERM GOAL #2   Title Pt to improve RUE functional use as evidenced by performing 20 blocks or greater on Box & Blocks test   Baseline eval = 12 (Lt = 53)    Time 8   Period Weeks   Status On-going     OT LONG TERM GOAL #3   Title Pt independent with splint wear and care prn for R fingers PRN (pt may not need splints)   Time 8   Period Weeks   Status On-going     OT LONG TERM GOAL #4   Title Pt to use RUE as gross assist  for all BADLS and bilateral tasks   Time 8   Period Weeks   Status On-going     OT LONG TERM GOAL #5   Title Pt will be able to print her name with R hand with AE prn   Status On-going     OT LONG TERM GOAL #6   Title Pt will be able to eat at least 75% of her meals with her right hand.   Status On-going     OT LONG TERM GOAL #7   Title Pt will demonstrate ability to use RUE for functional tasks with min compensations for reach patterns and hand patterns.    Status On-going               Plan - 03/30/17 1335    Clinical Impression Statement Pt progressing slowly but steadily toward goals.  Pt benefits from cues to use R hand as well as instruction to slow down to allow R hand to participate in activity.    Rehab Potential Good   OT Frequency 2x / week   OT Duration 8 weeks   OT Treatment/Interventions Self-care/ADL training;DME and/or AE instruction;Splinting;Patient/family education;Moist Heat;Therapeutic exercises;Therapeutic activities;Cryotherapy;Neuromuscular education;Passive range of motion;Cognitive remediation/compensation;Electrical Stimulation;Manual Therapy   Plan estim, pinch, grasp and release, manual therapy to address ROM/tightness in R hand,    Consulted and Agree with Plan of Care Patient      Patient will benefit from skilled therapeutic intervention in order to improve the following deficits and impairments:  Decreased coordination, Decreased range of motion, Impaired flexibility, Impaired sensation, Impaired tone, Decreased knowledge of precautions, Decreased knowledge of use of DME, Impaired UE functional use, Pain, Decreased strength, Decreased cognition  Visit Diagnosis: 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 [facility])  Other lack of coordination  Muscle weakness (generalized)  Acute pain of left shoulder  Abnormal posture    Problem List Patient Active Problem List   Diagnosis Date Noted  . Spastic monoplegia of  upper extremity (HCC) 03/14/2017  . Aphasia, late effect of cerebrovascular disease 03/14/2017  . Dysarthria as late effect of cerebrovascular  accident (CVA) 03/14/2017  . Contracture of joint of finger of right hand 02/02/2017  . Right hand weakness 02/02/2017  . CVA (cerebral vascular accident) (HCC) 09/29/2015  . HTN (hypertension) 09/29/2015  . Diabetes mellitus type 2, uncontrolled, with complications (HCC) 09/29/2015  . Tinea pedis 09/29/2015  . Eczema 09/29/2015    Norton Pastel, OTR/L 03/30/2017, 1:41 PM  [facility] [address] [place], [place], [address] Phone: [phone]   Fax:  [phone]  Name: [name] MRN: [id number] Date of Birth: 03-01-1966

## 2017-03-30 NOTE — Patient Instructions (Signed)
Home Exercises for your arms:  Hold a 3 pound weight in each hand and lay on your back.  1. Chest presses:  20 reps x 2 TWO times a day  2.  Overhead reach:  20 reps x 2 TWO times a day   3. Stand facing a wall. Place both hands on the wall with arms slightly bent.  Slowly slide your hands up the wall as far as you can.  HOLD for a slow count of 5.  Then slide hands back down. Do these slowly and relax into the stretch.  Don't worry about trying to get your hand flat.  This is a stretch for your shoulder not your hand. Do 10 in the morning and 10 in evening.

## 2017-04-03 ENCOUNTER — Ambulatory Visit: Payer: Medicare Other | Admitting: Occupational Therapy

## 2017-04-04 ENCOUNTER — Encounter: Payer: Medicare Other | Admitting: Occupational Therapy

## 2017-04-06 ENCOUNTER — Telehealth: Payer: Self-pay | Admitting: Family Medicine

## 2017-04-06 NOTE — Telephone Encounter (Signed)
Patient came by the office to drop off documentation that she needs patient to fill out for disability (right hand and speech). Placing in [employer]. Please call patient when it's ready for pick up.   Thank you.

## 2017-04-07 NOTE — Telephone Encounter (Signed)
Will forward to pcp

## 2017-04-10 ENCOUNTER — Encounter: Payer: Medicare Other | Admitting: Occupational Therapy

## 2017-04-10 ENCOUNTER — Ambulatory Visit: Payer: Medicare Other | Admitting: Occupational Therapy

## 2017-04-10 DIAGNOSIS — R278 Other lack of coordination: Secondary | ICD-10-CM | POA: Diagnosis not present

## 2017-04-10 DIAGNOSIS — I639 Cerebral infarction, unspecified: Secondary | ICD-10-CM | POA: Diagnosis not present

## 2017-04-10 DIAGNOSIS — IMO0002 Reserved for concepts with insufficient information to code with codable children: Secondary | ICD-10-CM

## 2017-04-10 DIAGNOSIS — G8111 Spastic hemiplegia affecting right dominant side: Secondary | ICD-10-CM | POA: Diagnosis not present

## 2017-04-10 DIAGNOSIS — R293 Abnormal posture: Secondary | ICD-10-CM | POA: Diagnosis not present

## 2017-04-10 DIAGNOSIS — M25512 Pain in left shoulder: Secondary | ICD-10-CM | POA: Diagnosis not present

## 2017-04-10 DIAGNOSIS — M6281 Muscle weakness (generalized): Secondary | ICD-10-CM | POA: Diagnosis not present

## 2017-04-10 NOTE — Therapy (Signed)
[facility] [address] [place], [place], [address] Phone: [phone]   Fax:  [phone]  Occupational Therapy Treatment  Patient Details  Name: [name] MRN: [id number] Date of Birth: 1966-03-08 Referring Provider: Dr. Dessa Phi  Encounter Date: 04/10/2017      OT End of Session - 04/10/17 1216    Visit Number 9   Number of Visits 17   Date for OT Re-Evaluation 04/22/17   Authorization Type MCR - G code needed   Authorization - Visit Number 9   Authorization - Number of Visits 10   OT Start Time 0800   OT Stop Time 0845   OT Time Calculation (min) 45 min   Activity Tolerance Patient tolerated treatment well      Past Medical History:  Diagnosis Date  . Diabetes mellitus without complication (HCC) 2013   previously on victoza   . Gestational diabetes 2009   . Hypertension 09/07/2014  . Stroke (HCC) 09/07/2014   R side. weakness in upper arm. No deficit in leg. deficit in speech. A littel bit in coordination.     Past Surgical History:  Procedure Laterality Date  . BREAST SURGERY    . CESAREAN SECTION    . TUBAL LIGATION  2009    There were no vitals filed for this visit.      Subjective Assessment - 04/10/17 0934    Subjective  My Lt thumb hurts   Pertinent History CHECK BP!!!! CVA 09/07/2014 with Rt dominant residual spastic hemiplegia   Currently in Pain? No/denies                      OT Treatments/Exercises (OP) - 04/10/17 0001      ADLs   Writing Practiced writing with built up pen but unable. Pt also practiced with Pen Again, and able to write first name with 80% legibility. Provided handout, and told how/where to purchase if pt wishes to pursue   ADL Comments BP: 135/83. Pt reports pain in Lt thumb (most likely from overuse), especially with opening jars and bottles. Discussed A/E for this (jar opener and shelf liner)     Fine Motor Coordination   Other Fine  Motor Exercises Practiced grasping/releasing 1" blocks, followed by wooden cylindrical pegs and removing/placing in wooden pegboard     Modalities   Modalities Electrical Stimulation     Electrical Stimulation   Electrical Stimulation Location dorsal forearm  therapist noted facilitated thumb/wrist but not PIP joints of index and long fingers   Electrical Stimulation Action for wrist/finger extension   Electrical Stimulation Parameters 50 pps, 250 pw, 10 sec. on/off cycle x 10 min   Electrical Stimulation Goals Neuromuscular facilitation  Pt reports it helped w/ thumb, but not PIP joints of 2nd/3rd     Manual Therapy   Manual therapy comments Gentle P/ROM to PIP joints in extension with MP's flexed, followed by gentle full composite extension in P/ROM (prior to estim)                   OT Short Term Goals - 03/30/17 1335      OT SHORT TERM GOAL #1   Title Independent with initial HEP for functional safe activities for Rt hand (due 03/23/17)    Time 4   Period Weeks   Status Achieved     OT SHORT TERM GOAL #2   Title Pt to verbalize understanding with safety techniques d/t loss of sensation  Rt hand   Time 4   Status Achieved     OT SHORT TERM GOAL #3   Title Pt to verbalize understanding with A/E to increase ease and safety with ADLS/IADLS   Time 4   Period Weeks   Status Achieved     OT SHORT TERM GOAL #4   Title Pt will demonstrate ability to eat at least 50% of her meals with her R hand   Status Achieved     OT SHORT TERM GOAL #5   Title Pt will demonstrate abilty to pick up small objects with R hand (2 pt and 3 pt pinch) for functional tasks   Status On-going     OT SHORT TERM GOAL #6   Title Pt will rate pain in L shoulder no more than 2/10 due to overuse issues with functional tasks.   Status Achieved     OT SHORT TERM GOAL #7   Title Pt will demonstate ability to open jars with greater ease.   Status On-going           OT Long Term Goals -  03/30/17 1335      OT LONG TERM GOAL #1   Title Independent with updated HEP due 04/22/17)    Time 8   Period Weeks   Status On-going     OT LONG TERM GOAL #2   Title Pt to improve RUE functional use as evidenced by performing 20 blocks or greater on Box & Blocks test   Baseline eval = 12 (Lt = 53)    Time 8   Period Weeks   Status On-going     OT LONG TERM GOAL #3   Title Pt independent with splint wear and care prn for R fingers PRN (pt may not need splints)   Time 8   Period Weeks   Status On-going     OT LONG TERM GOAL #4   Title Pt to use RUE as gross assist for all BADLS and bilateral tasks   Time 8   Period Weeks   Status On-going     OT LONG TERM GOAL #5   Title Pt will be able to print her name with R hand with AE prn   Status On-going     OT LONG TERM GOAL #6   Title Pt will be able to eat at least 75% of her meals with her right hand.   Status On-going     OT LONG TERM GOAL #7   Title Pt will demonstrate ability to use RUE for functional tasks with min compensations for reach patterns and hand patterns.    Status On-going               Plan - 04/10/17 1216    Clinical Impression Statement Pt making steady progress with RUE functional use. Reports eating more with RUE.    Rehab Potential Good   OT Frequency 2x / week   OT Duration 8 weeks   OT Treatment/Interventions Self-care/ADL training;DME and/or AE instruction;Splinting;Patient/family education;Moist Heat;Therapeutic exercises;Therapeutic activities;Cryotherapy;Neuromuscular education;Passive range of motion;Cognitive remediation/compensation;Electrical Stimulation;Manual Therapy   Plan G-Code next visit, continue functional use RUE   Consulted and Agree with Plan of Care Patient      Patient will benefit from skilled therapeutic intervention in order to improve the following deficits and impairments:  Decreased coordination, Decreased range of motion, Impaired flexibility, Impaired sensation,  Impaired tone, Decreased knowledge of precautions, Decreased knowledge of use of DME, Impaired UE functional use,  Pain, Decreased strength, Decreased cognition  Visit Diagnosis: 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 [facility])  Other lack of coordination    Problem List Patient Active Problem List   Diagnosis Date Noted  . 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 Aphasia, late effect of cerebrovascular disease 03/14/2017  . Dysarthria as late effect of cerebrovascular accident (CVA) 03/14/2017  . Contracture of joint of finger of right hand 02/02/2017  . Right hand weakness 02/02/2017  . CVA (cerebral vascular accident) (HCC) 09/29/2015  . HTN (hypertension) 09/29/2015  . Diabetes mellitus type 2, uncontrolled, with complications (HCC) 09/29/2015  . Tinea pedis 09/29/2015  . Eczema 09/29/2015    Kelli Churn, OTR/L 04/10/2017, 12:18 PM  [facility] [address] [place], [place], [address] Phone: [phone]   Fax:  [phone]  Name: [name] MRN: [id number] Date of Birth: 1966/02/20

## 2017-04-11 ENCOUNTER — Ambulatory Visit: Payer: Medicare Other | Admitting: Occupational Therapy

## 2017-04-11 ENCOUNTER — Ambulatory Visit: Payer: Medicare Other | Admitting: Physical Medicine & Rehabilitation

## 2017-04-11 ENCOUNTER — Ambulatory Visit: Payer: Medicare Other

## 2017-04-11 NOTE — Telephone Encounter (Signed)
Please inform patient that social security disability paperwork is reay for pick up

## 2017-04-12 NOTE — Telephone Encounter (Signed)
Pt was called and informed of paperwork being ready for pick up. 

## 2017-04-14 ENCOUNTER — Ambulatory Visit: Payer: Medicare Other | Attending: Family Medicine | Admitting: Physician Assistant

## 2017-04-14 ENCOUNTER — Encounter: Payer: Self-pay | Admitting: Physician Assistant

## 2017-04-14 VITALS — BP 136/87 | HR 110 | Temp 98.5°F | Resp 16 | Wt 185.6 lb

## 2017-04-14 DIAGNOSIS — E119 Type 2 diabetes mellitus without complications: Secondary | ICD-10-CM | POA: Insufficient documentation

## 2017-04-14 DIAGNOSIS — R05 Cough: Secondary | ICD-10-CM

## 2017-04-14 DIAGNOSIS — Z79899 Other long term (current) drug therapy: Secondary | ICD-10-CM | POA: Insufficient documentation

## 2017-04-14 DIAGNOSIS — I10 Essential (primary) hypertension: Secondary | ICD-10-CM | POA: Insufficient documentation

## 2017-04-14 DIAGNOSIS — I6300 Cerebral infarction due to thrombosis of unspecified precerebral artery: Secondary | ICD-10-CM | POA: Diagnosis not present

## 2017-04-14 DIAGNOSIS — Z7984 Long term (current) use of oral hypoglycemic drugs: Secondary | ICD-10-CM | POA: Diagnosis not present

## 2017-04-14 DIAGNOSIS — IMO0002 Reserved for concepts with insufficient information to code with codable children: Secondary | ICD-10-CM

## 2017-04-14 DIAGNOSIS — Z8673 Personal history of transient ischemic attack (TIA), and cerebral infarction without residual deficits: Secondary | ICD-10-CM | POA: Diagnosis not present

## 2017-04-14 DIAGNOSIS — E1165 Type 2 diabetes mellitus with hyperglycemia: Secondary | ICD-10-CM

## 2017-04-14 DIAGNOSIS — E118 Type 2 diabetes mellitus with unspecified complications: Secondary | ICD-10-CM

## 2017-04-14 DIAGNOSIS — R059 Cough, unspecified: Secondary | ICD-10-CM

## 2017-04-14 LAB — GLUCOSE, POCT (MANUAL RESULT ENTRY): POC Glucose: 193 mg/dl — AB (ref 70–99)

## 2017-04-14 MED ORDER — BENZONATATE 100 MG PO CAPS: 100.0000 mg | ORAL_CAPSULE | Freq: Three times a day (TID) | ORAL | 0 refills | Status: DC | PRN

## 2017-04-14 MED FILL — BENZONATATE 100 MG CAPSULE: 100 | 20 days supply | Qty: 60 | Fill #0

## 2017-04-14 NOTE — Patient Instructions (Signed)
Cough, Adult  A cough helps to clear your throat and lungs. A cough may last only 2?3 weeks (acute), or it may last longer than 8 weeks (chronic). Many different things can cause a cough. A cough may be a sign of an illness or another medical condition.  Follow these instructions at home:  ? Pay attention to any changes in your cough.  ? Take medicines only as told by your doctor.  ? If you were prescribed an antibiotic medicine, take it as told by your doctor. Do not stop taking it even if you start to feel better.  ? Talk with your doctor before you try using a cough medicine.  ? Drink enough fluid to keep your pee (urine) clear or pale yellow.  ? If the air is dry, use a cold steam vaporizer or humidifier in your home.  ? Stay away from things that make you cough at work or at home.  ? If your cough is worse at night, try using extra pillows to raise your head up higher while you sleep.  ? Do not smoke, and try not to be around smoke. If you need help quitting, ask your doctor.  ? Do not have caffeine.  ? Do not drink alcohol.  ? Rest as needed.  Contact a doctor if:  ? You have new problems (symptoms).  ? You cough up yellow fluid (pus).  ? Your cough does not get better after 2?3 weeks, or your cough gets worse.  ? Medicine does not help your cough and you are not sleeping well.  ? You have pain that gets worse or pain that is not helped with medicine.  ? You have a fever.  ? You are losing weight and you do not know why.  ? You have night sweats.  Get help right away if:  ? You cough up blood.  ? You have trouble breathing.  ? Your heartbeat is very fast.  This information is not intended to replace advice given to you by your health care provider. Make sure you discuss any questions you have with your health care provider.  Document Released: 08/18/2011 Document Revised: 05/12/2016 Document Reviewed: 02/11/2015  Elsevier Interactive Patient Education ? 2017 Elsevier Inc.

## 2017-04-14 NOTE — Progress Notes (Signed)
[name], is a 51 y.o. female  KDT:[id number]  DXI:[id number]  DOB - Mar 16, 1966  Subjective:  Chief Complaint and HPI: [name] is a 51 y.o. female here today for 2-3 week h/o dry, harsh, non-productive cough.  She hasn't felt sick.  No mucus/phlegm production.  No reflux.  Denies allergies.  No hemoptysis.  She was recently taken off Lisinopril in Mid March and put on Losartan.  The cough started a few weeks after that.  No f/fc.  No runny nose or allergy type symptoms.  Taking diabetes meds as directed. Denies s/sx hyper/hypoglycemia  ROS:   Constitutional:  No f/c, No night sweats, No unexplained weight loss. EENT:  No vision changes, No blurry vision, No hearing changes. No mouth, throat, or ear problems.  Respiratory: +cough, No SOB, no wheezing Cardiac: No CP, no palpitations GI:  No abd pain, No N/V/D. GU: No Urinary s/sx Musculoskeletal: No joint pain Neuro: No headache, no dizziness, no motor weakness.  Skin: No rash Endocrine:  No polydipsia. No polyuria.  Psych: Denies SI/HI  No problems updated.  ALLERGIES: No Known Allergies  PAST MEDICAL HISTORY: Past Medical History:  Diagnosis Date  . Diabetes mellitus without complication ([place]) 7673   previously on [place]   . Gestational diabetes 2009   . Hypertension 09/07/2014  . Stroke ([place]) 09/07/2014   R side. weakness in upper arm. No deficit in leg. deficit in speech. A littel bit in coordination.     MEDICATIONS AT HOME: Prior to Admission medications   Medication Sig Start Date End Date Taking? Authorizing Provider  atorvastatin (LIPITOR) 40 MG tablet Take 1 tablet (40 mg total) by mouth daily. 10/13/16   Josalyn Funches, MD  benzonatate (TESSALON) 100 MG capsule Take 1 capsule (100 mg total) by mouth 3 (three) times daily as needed for cough. 04/14/17   Argentina Donovan, PA-C  Blood Glucose Monitoring Suppl (TRUE METRIX METER) w/Device KIT 1 each by Does not apply route as needed. 10/13/16   Josalyn  Funches, MD  glipiZIDE (GLUCOTROL) 5 MG tablet Take 1 tablet (5 mg total) by mouth 2 (two) times daily before a meal. 03/02/17   Josalyn Funches, MD  glucose blood (TRUE METRIX BLOOD GLUCOSE TEST) test strip 1 each by Other route 2 (two) times daily. 10/13/16   Josalyn Funches, MD  losartan-hydrochlorothiazide (HYZAAR) 50-12.5 MG tablet Take 2 tablets by mouth daily. 03/02/17   Boykin Nearing, MD  sitaGLIPtin-metformin (JANUMET) 50-1000 MG tablet Take 1 tablet by mouth 2 (two) times daily with a meal. 03/02/17   Josalyn Funches, MD  tiZANidine (ZANAFLEX) 2 MG tablet Take 1 tablet (2 mg total) by mouth 2 (two) times daily. 03/14/17   Charlett Blake, MD  TRUEPLUS LANCETS 28G MISC 1 each by Does not apply route 2 (two) times daily. 10/13/16   Boykin Nearing, MD  TURMERIC PO Take by mouth.    Historical Provider, MD     Objective:  EXAM:   Vitals:   04/14/17 1111  BP: 136/87  Pulse: (!) 110  Resp: 16  Temp: 98.5 F (36.9 C)  TempSrc: Oral  SpO2: 100%  Weight: 185 lb 9.6 oz (84.2 kg)    General appearance : A&OX3. NAD. Non-toxic-appearing, cough is frequent, harsh and dry sounding. HEENT: Atraumatic and Normocephalic.  PERRLA. EOM intact.  TM clear B. Mouth-MMM, post pharynx WNL w/o erythema, No PND. Neck: supple, no JVD. No cervical lymphadenopathy. No thyromegaly Chest/Lungs:  Breathing-non-labored, Good air entry bilaterally, breath sounds normal  without rales, rhonchi, or wheezing  CVS: S1 S2 regular, no murmurs, gallops, rubs, rate at 92bpm on exam Extremities: Bilateral Lower Ext shows no edema, both legs are warm to touch with = pulse throughout Neurology:  CN II-XII grossly intact, Non focal.   Psych:  TP linear. J/I WNL. Normal speech. Appropriate eye contact and affect.  Skin:  No Rash  Data Review Lab Results  Component Value Date   HGBA1C 9.9 03/02/2017   HGBA1C 10.4 10/13/2016   HGBA1C 11.50 09/29/2015     Assessment & Plan   1. Uncontrolled type 2 diabetes  mellitus with complication, without long-term current use of insulin (HCC) Uncontrolled.  Not time for A1C.  Work on eliminating sugars from diet and following diabetes diet. - POCT glucose (manual entry) I have had a lengthy discussion and provided education about insulin resistance and the intake of too much sugar/refined carbohydrates.  I have advised the patient to work at a goal of eliminating sugary drinks, candy, desserts, sweets, refined sugars, processed foods, and white carbohydrates.  The patient expresses understanding.    2. Cough Unsure etiology-no sign allergies, reflux, or illness.  Consider possible SE of ARB which, although less likely with and ARB than with and ACE, still occurs in up to 3% of patients. - benzonatate (TESSALON) 100 MG capsule; Take 1 capsule (100 mg total) by mouth 3 (three) times daily as needed for cough.  Dispense: 60 capsule; Refill: 0  Patient have been counseled extensively about nutrition and exercise  Return in about 3 weeks (around 05/05/2017) for Dr Babs Bertin; f/up cough ? from BP med.  The patient was given clear instructions to go to ER or return to medical center if symptoms don't improve, worsen or new problems develop. The patient verbalized understanding. The patient was told to call to get lab results if they haven't heard anything in the next week.     Freeman Caldron, PA-C [facility] [place], [place]   04/14/2017, 11:42 AMPatient ID: [name], female   DOB: [birth year], 51 y.o.   MRN: [id number]

## 2017-04-17 ENCOUNTER — Ambulatory Visit: Payer: Medicare Other | Admitting: Occupational Therapy

## 2017-04-17 ENCOUNTER — Encounter: Payer: Self-pay | Admitting: Occupational Therapy

## 2017-04-17 DIAGNOSIS — M25512 Pain in left shoulder: Secondary | ICD-10-CM | POA: Diagnosis not present

## 2017-04-17 DIAGNOSIS — M6281 Muscle weakness (generalized): Secondary | ICD-10-CM | POA: Diagnosis not present

## 2017-04-17 DIAGNOSIS — G8111 Spastic hemiplegia affecting right dominant side: Secondary | ICD-10-CM | POA: Diagnosis not present

## 2017-04-17 DIAGNOSIS — IMO0002 Reserved for concepts with insufficient information to code with codable children: Secondary | ICD-10-CM

## 2017-04-17 DIAGNOSIS — I639 Cerebral infarction, unspecified: Secondary | ICD-10-CM | POA: Diagnosis not present

## 2017-04-17 DIAGNOSIS — R278 Other lack of coordination: Secondary | ICD-10-CM | POA: Diagnosis not present

## 2017-04-17 DIAGNOSIS — R293 Abnormal posture: Secondary | ICD-10-CM | POA: Diagnosis not present

## 2017-04-17 NOTE — Therapy (Signed)
[facility] [address] [place], [place], [address] Phone: [phone]   Fax:  [phone]  Occupational Therapy Treatment  Patient Details  Name: [name] MRN: [id number] Date of Birth: July 15, 1966 Referring Provider: Dr. Dessa Phi  Encounter Date: 04/17/2017      OT End of Session - 04/17/17 0908    Visit Number 10   Number of Visits 17   Date for OT Re-Evaluation 04/22/17   Authorization Type MCR - G code needed   Authorization - Visit Number 10   Authorization - Number of Visits 20   OT Start Time 0802   OT Stop Time 0846   OT Time Calculation (min) 44 min   Activity Tolerance Patient tolerated treatment well      Past Medical History:  Diagnosis Date  . Diabetes mellitus without complication (HCC) 2013   previously on victoza   . Gestational diabetes 2009   . Hypertension 09/07/2014  . Stroke (HCC) 09/07/2014   R side. weakness in upper arm. No deficit in leg. deficit in speech. A littel bit in coordination.     Past Surgical History:  Procedure Laterality Date  . BREAST SURGERY    . CESAREAN SECTION    . TUBAL LIGATION  2009    There were no vitals filed for this visit.      Subjective Assessment - 04/17/17 0804    Subjective  I don't have any pain at all today!   Pertinent History CHECK BP!!!! CVA 09/07/2014 with Rt dominant residual spastic hemiplegia   Currently in Pain? No/denies                      OT Treatments/Exercises (OP) - 04/17/17 0001      Neurological Re-education Exercises   Other Exercises 2 Neuro re ed following estim to use finger extension/open hand with functional task - pt needs cues to open hand as much as possible, to use vision to ensure appropriate grasp and orientation of hand, and to use intermittent tone reduction strategies during tasks.  After practice, pt needed far less cueing and was incoporating tone reduction and visual compensation  into tasks.  Pt also used R hand to don jacket upon leaving without cueing.      Programme researcher, broadcasting/film/video Location dorsal forearm for functional hand extension   Electrical Stimulation Action Wrist/finger extension   Electrical Stimulation Parameters 50pps, 250 pw, intensity 12 x10 minutes tolerated well.  Pt with contracture at PIP of index and middle finger and increased tone with repetitive extension.     Electrical Stimulation Goals Neuromuscular facilitation;Tone                  OT Short Term Goals - 04/17/17 0904      OT SHORT TERM GOAL #1   Title Independent with initial HEP for functional safe activities for Rt hand (due 04/10/17)    Time 4   Period Weeks   Status Achieved     OT SHORT TERM GOAL #2   Title Pt to verbalize understanding with safety techniques d/t loss of sensation Rt hand   Time 4   Status Achieved     OT SHORT TERM GOAL #3   Title Pt to verbalize understanding with A/E to increase ease and safety with ADLS/IADLS   Time 4   Period Weeks   Status Achieved     OT SHORT TERM GOAL #4   Title  Pt will demonstrate ability to eat at least 50% of her meals with her R hand   Status Achieved     OT SHORT TERM GOAL #5   Title Pt will demonstrate abilty to pick up small objects with R hand (2 pt and 3 pt pinch) for functional tasks   Status Achieved     OT SHORT TERM GOAL #6   Title Pt will rate pain in L shoulder no more than 2/10 due to overuse issues with functional tasks.   Status Achieved     OT SHORT TERM GOAL #7   Title Pt will demonstate ability to open jars with greater ease.   Status Partially Met  pt educated on available AE to assist with opening jars           OT Long Term Goals - 04/17/17 0904      OT LONG TERM GOAL #1   Title Independent with updated HEP due 05/08/17)    Time 8   Period Weeks   Status On-going     OT LONG TERM GOAL #2   Title Pt to improve RUE functional use as evidenced by  performing 20 blocks or greater on Box & Blocks test   Baseline eval = 12 (Lt = 53)    Time 8   Period Weeks   Status On-going     OT LONG TERM GOAL #3   Title Pt independent with splint wear and care prn for R fingers PRN (pt may not need splints)   Time 8   Period Weeks   Status Achieved     OT LONG TERM GOAL #4   Title Pt to use RUE as gross assist for all BADLS and bilateral tasks   Time 8   Period Weeks   Status On-going     OT LONG TERM GOAL #5   Title Pt will be able to print her name with R hand with AE prn   Status On-going     OT LONG TERM GOAL #6   Title Pt will be able to eat at least 75% of her meals with her right hand.   Status On-going     OT LONG TERM GOAL #7   Title Pt will demonstrate ability to use RUE for functional tasks with min compensations for reach patterns and hand patterns.    Status On-going               Plan - 04/17/17 0907    Clinical Impression Statement Pt progressing toward goals.  Pt continues to report increased functional use of RUE at home.    Rehab Potential Good   OT Frequency 2x / week   OT Duration 8 weeks   OT Treatment/Interventions Self-care/ADL training;DME and/or AE instruction;Splinting;Patient/family education;Moist Heat;Therapeutic exercises;Therapeutic activities;Cryotherapy;Neuromuscular education;Passive range of motion;Cognitive remediation/compensation;Electrical Stimulation;Manual Therapy   Plan estim, NMR for RUE, visual compensation, functional use of RUE, manual therapy for tone reduction and joint manipulation, continue to add to home activities program   Consulted and Agree with Plan of Care Patient      Patient will benefit from skilled therapeutic intervention in order to improve the following deficits and impairments:  Decreased coordination, Decreased range of motion, Impaired flexibility, Impaired sensation, Impaired tone, Decreased knowledge of precautions, Decreased knowledge of use of DME,  Impaired UE functional use, Pain, Decreased strength, Decreased cognition  Visit Diagnosis: 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 [facility])  Other lack of coordination  Muscle weakness (generalized)  Acute pain of left shoulder      G-Codes - 04/20/17 0909    Functional Assessment Tool Used (Outpatient only) RUE: Box & Blocks = 12, 9 hole peg: unable   Functional Limitation Carrying, moving and handling objects   Carrying, Moving and Handling Objects Current Status (X5883) At least 40 percent but less than 60 percent impaired, limited or restricted   Carrying, Moving and Handling Objects Goal Status (G5498) At least 20 percent but less than 40 percent impaired, limited or restricted      Problem List Patient Active Problem List   Diagnosis Date Noted  . Spastic monoplegia of upper extremity ([place]) 03/14/2017  . Aphasia, late effect of cerebrovascular disease 03/14/2017  . Dysarthria as late effect of cerebrovascular accident (CVA) 03/14/2017  . Contracture of joint of finger of right hand 02/02/2017  . Right hand weakness 02/02/2017  . CVA (cerebral vascular accident) ([place]) 09/29/2015  . HTN (hypertension) 09/29/2015  . Diabetes mellitus type 2, uncontrolled, with complications ([place]) 26/41/5830  . Tinea pedis 09/29/2015  . Eczema 09/29/2015    Quay Burow, OTR/L 20-Apr-2017, 9:10 AM  [facility] [address] [place], [place], [address] Phone: [phone]   Fax:  [phone]  Name: [name] MRN: [id number] Date of Birth: 1966/08/14

## 2017-04-18 ENCOUNTER — Encounter: Payer: Self-pay | Admitting: Occupational Therapy

## 2017-04-18 ENCOUNTER — Ambulatory Visit: Payer: Medicare Other | Attending: Family Medicine | Admitting: Occupational Therapy

## 2017-04-18 VITALS — BP 145/77 | HR 84

## 2017-04-18 DIAGNOSIS — R293 Abnormal posture: Secondary | ICD-10-CM | POA: Insufficient documentation

## 2017-04-18 DIAGNOSIS — IMO0002 Reserved for concepts with insufficient information to code with codable children: Secondary | ICD-10-CM

## 2017-04-18 DIAGNOSIS — I639 Cerebral infarction, unspecified: Secondary | ICD-10-CM | POA: Insufficient documentation

## 2017-04-18 DIAGNOSIS — G8111 Spastic hemiplegia affecting right dominant side: Secondary | ICD-10-CM | POA: Insufficient documentation

## 2017-04-18 DIAGNOSIS — M25512 Pain in left shoulder: Secondary | ICD-10-CM

## 2017-04-18 DIAGNOSIS — M6281 Muscle weakness (generalized): Secondary | ICD-10-CM | POA: Diagnosis not present

## 2017-04-18 DIAGNOSIS — R278 Other lack of coordination: Secondary | ICD-10-CM | POA: Insufficient documentation

## 2017-04-18 NOTE — Therapy (Signed)
[facility] [address] [place], [place], [address] Phone: [phone]   Fax:  [phone]  Occupational Therapy Treatment  Patient Details  Name: [name] MRN: [id number] Date of Birth: [birth year] Referring Provider: Dr. Boykin Nearing  Encounter Date: 04/18/2017      OT End of Session - 04/18/17 1030    Visit Number 11   Number of Visits 17   Date for OT Re-Evaluation 04/22/17   Authorization Type MCR - G code needed   Authorization - Visit Number 11   Authorization - Number of Visits 20   OT Start Time 0931   OT Stop Time 1020   OT Time Calculation (min) 49 min   Activity Tolerance Patient tolerated treatment well      Past Medical History:  Diagnosis Date  . Diabetes mellitus without complication ([place]) 6962   previously on [place]   . Gestational diabetes 2009   . Hypertension 09/07/2014  . Stroke ([place]) 09/07/2014   R side. weakness in upper arm. No deficit in leg. deficit in speech. A littel bit in coordination.     Past Surgical History:  Procedure Laterality Date  . BREAST SURGERY    . CESAREAN SECTION    . TUBAL LIGATION  2009    Vitals:   04/18/17 0936  BP: (!) 145/77  Pulse: 84        Subjective Assessment - 04/18/17 0934    Subjective  I feel pretty good today   Pertinent History CHECK BP!!!! CVA 09/07/2014 with Rt dominant residual spastic hemiplegia   Currently in Pain? No/denies                      OT Treatments/Exercises (OP) - 04/18/17 0001      ADLs   Writing Addressed writing again - pt today is able to hold marker with coban wrapped around it to prevent hand from slipping on marker.  Pt able to trace shapes with good accuracy when she decreases speed with writing. Also praticed writing name with brown foam on pen with coban wrap - pt able to print first and last name legibly with increased time and effort.  Pt became very tearful and stated "I never  thought I would be able to do this again with my right hand."  Pt issued tracing sheets as well as coban - pt to purchase large marker to work on activities at home.  Recomended that pt work in short, frequent bursts vs longer period dedicated to writing as pt's tone increases over time as hand fatigues. Pt able to use tone reduction techniques and did so without prompting during tasks.  Pt verbalized understanding.     ADL Comments Addressed goal for Box and Blocks - on eval pt scored 12 blocks;  today pt scored 19 blocks with R hand. Pt also using R hand spontaneously for bilateral activities such as holding purse open with R hand while left hand finds object, using R hand to place homework papers into large canvas bag, picking up objects to put into purse at end of session.       Splinting   Splinting fabricated pattern for modified dorsal splint with MCP bar.  Pt with far more isolated extension in R fingers with wrist in neutral and MCP's supported.  Will fabricate splint next session - pt to use during the day to improve functional use of the R hand.  Pt verbalized understanding of use of  splint.  Will reinforce and practice after fabrication completed.                   OT Short Term Goals - 04/18/17 1026      OT SHORT TERM GOAL #1   Title Independent with initial HEP for functional safe activities for Rt hand (due 04/10/17)    Status Achieved     OT SHORT TERM GOAL #2   Title Pt to verbalize understanding with safety techniques d/t loss of sensation Rt hand   Status Achieved     OT SHORT TERM GOAL #3   Title Pt to verbalize understanding with A/E to increase ease and safety with ADLS/IADLS   Status Achieved     OT SHORT TERM GOAL #4   Title Pt will demonstrate ability to eat at least 50% of her meals with her R hand   Status Achieved     OT SHORT TERM GOAL #5   Title Pt will demonstrate abilty to pick up small objects with R hand (2 pt and 3 pt pinch) for functional tasks    Status Achieved     OT SHORT TERM GOAL #6   Title Pt will rate pain in L shoulder no more than 2/10 due to overuse issues with functional tasks.   Status Achieved     OT SHORT TERM GOAL #7   Title Pt will demonstate ability to open jars with greater ease.   Status Partially Met  pt educated on available AE to assist with opening jars           OT Long Term Goals - 04/18/17 1026      OT LONG TERM GOAL #1   Title Independent with updated HEP due 05/08/17)    Status On-going     OT LONG TERM GOAL #2   Title Pt to improve RUE functional use as evidenced by performing 20 blocks or greater on Box & Blocks test   Baseline eval = 12 (Lt = 53)    Status On-going     OT LONG TERM GOAL #3   Title Pt independent with splint wear and care prn for R fingers PRN (pt may not need splints)   Status On-going     OT LONG TERM GOAL #4   Title Pt to use RUE as gross assist for all BADLS and bilateral tasks   Status On-going     OT LONG TERM GOAL #5   Title Pt will be able to print her name with R hand with AE prn   Status Achieved     OT LONG TERM GOAL #6   Title Pt will be able to eat at least 75% of her meals with her right hand.   Status Achieved     OT LONG TERM GOAL #7   Title Pt will demonstrate ability to use RUE for functional tasks with min compensations for reach patterns and hand patterns.    Status On-going               Plan - 04/18/17 1028    Clinical Impression Statement Pt continues to progress toward goals. Pt very emotional today with how much more she is doing with her R hand.  Pt using R hand more spontaneously   Rehab Potential Good   OT Frequency 2x / week   OT Duration 8 weeks   OT Treatment/Interventions Self-care/ADL training;DME and/or AE instruction;Splinting;Patient/family education;Moist Heat;Therapeutic exercises;Therapeutic activities;Cryotherapy;Neuromuscular education;Passive range of motion;Cognitive remediation/compensation;Electrical  Stimulation;Manual Therapy   Plan fabricate dorsal splint with MCP bar, NMR for RUE, visual compensation, functional use of RUE, ,manual therapy for tone reduction and joint manipulation, continue to address home activities program   Consulted and Agree with Plan of Care Patient      Patient will benefit from skilled therapeutic intervention in order to improve the following deficits and impairments:  Decreased coordination, Decreased range of motion, Impaired flexibility, Impaired sensation, Impaired tone, Decreased knowledge of precautions, Decreased knowledge of use of DME, Impaired UE functional use, Pain, Decreased strength, Decreased cognition  Visit Diagnosis: 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 [facility])  Other lack of coordination  Muscle weakness (generalized)  Acute pain of left shoulder  Abnormal posture      G-Codes - 05/01/17 0909    Functional Assessment Tool Used (Outpatient only) RUE: Box & Blocks = 12, 9 hole peg: unable   Functional Limitation Carrying, moving and handling objects   Carrying, Moving and Handling Objects Current Status (O6767) At least 40 percent but less than 60 percent impaired, limited or restricted   Carrying, Moving and Handling Objects Goal Status (M0947) At least 20 percent but less than 40 percent impaired, limited or restricted      Problem List Patient Active Problem List   Diagnosis Date Noted  . 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 Aphasia, late effect of cerebrovascular disease 03/14/2017  . Dysarthria as late effect of cerebrovascular accident (CVA) 03/14/2017  . Contracture of joint of finger of right hand 02/02/2017  . Right hand weakness 02/02/2017  . CVA (cerebral vascular accident) ([place]) 09/29/2015  . HTN (hypertension) 09/29/2015  . Diabetes mellitus type 2, uncontrolled, with complications ([place]) 09/62/8366  . Tinea pedis 09/29/2015  . Eczema 09/29/2015    Quay Burow, OTR/L 04/18/2017, 10:31 AM  [facility] [address] [place], [place], [address] Phone: [phone]   Fax:  [phone]  Name: [name] MRN: [id number] Date of Birth: 1966-11-05

## 2017-04-25 ENCOUNTER — Ambulatory Visit: Payer: Medicare Other | Admitting: Occupational Therapy

## 2017-04-25 DIAGNOSIS — R293 Abnormal posture: Secondary | ICD-10-CM | POA: Diagnosis not present

## 2017-04-25 DIAGNOSIS — R278 Other lack of coordination: Secondary | ICD-10-CM | POA: Diagnosis not present

## 2017-04-25 DIAGNOSIS — IMO0002 Reserved for concepts with insufficient information to code with codable children: Secondary | ICD-10-CM

## 2017-04-25 DIAGNOSIS — I639 Cerebral infarction, unspecified: Secondary | ICD-10-CM | POA: Diagnosis not present

## 2017-04-25 DIAGNOSIS — G8111 Spastic hemiplegia affecting right dominant side: Secondary | ICD-10-CM | POA: Diagnosis not present

## 2017-04-25 DIAGNOSIS — M6281 Muscle weakness (generalized): Secondary | ICD-10-CM | POA: Diagnosis not present

## 2017-04-25 DIAGNOSIS — M25512 Pain in left shoulder: Secondary | ICD-10-CM | POA: Diagnosis not present

## 2017-04-25 NOTE — Therapy (Signed)
[facility] [address] [place], [place], [address] Phone: [phone]   Fax:  [phone]  Occupational Therapy Treatment  Patient Details  Name: [name] MRN: [id number] Date of Birth: 10-19-1966 Referring Provider: Dr. Boykin Nearing  Encounter Date: 04/25/2017      OT End of Session - 04/25/17 1312    Visit Number 12   Number of Visits 17   Date for OT Re-Evaluation 04/22/17   Authorization Type MCR - G code needed   Authorization - Visit Number 12   Authorization - Number of Visits 20   OT Start Time 2355   OT Stop Time 1100   OT Time Calculation (min) 45 min   Activity Tolerance Patient tolerated treatment well      Past Medical History:  Diagnosis Date  . Diabetes mellitus without complication ([place]) 7322   previously on [place]   . Gestational diabetes 2009   . Hypertension 09/07/2014  . Stroke ([place]) 09/07/2014   R side. weakness in upper arm. No deficit in leg. deficit in speech. A littel bit in coordination.     Past Surgical History:  Procedure Laterality Date  . BREAST SURGERY    . CESAREAN SECTION    . TUBAL LIGATION  2009    There were no vitals filed for this visit.      Subjective Assessment - 04/25/17 1057    Subjective  This splint feels fine right now   Pertinent History CHECK BP!!!! CVA 09/07/2014 with Rt dominant residual spastic hemiplegia   Currently in Pain? No/denies                      OT Treatments/Exercises (OP) - 04/25/17 0001      Splinting   Splinting Fabricated and fitted modified dorsal splint with MCP bar. Issued splint and instructed in wear and care.                 OT Education - 04/25/17 1101    Education provided Yes   Education Details splint wear and care   Person(s) Educated Patient   Methods Explanation;Handout   Comprehension Verbalized understanding          OT Short Term Goals - 04/18/17 1026      OT SHORT  TERM GOAL #1   Title Independent with initial HEP for functional safe activities for Rt hand (due 04/10/17)    Status Achieved     OT SHORT TERM GOAL #2   Title Pt to verbalize understanding with safety techniques d/t loss of sensation Rt hand   Status Achieved     OT SHORT TERM GOAL #3   Title Pt to verbalize understanding with A/E to increase ease and safety with ADLS/IADLS   Status Achieved     OT SHORT TERM GOAL #4   Title Pt will demonstrate ability to eat at least 50% of her meals with her R hand   Status Achieved     OT SHORT TERM GOAL #5   Title Pt will demonstrate abilty to pick up small objects with R hand (2 pt and 3 pt pinch) for functional tasks   Status Achieved     OT SHORT TERM GOAL #6   Title Pt will rate pain in L shoulder no more than 2/10 due to overuse issues with functional tasks.   Status Achieved     OT SHORT TERM GOAL #7   Title Pt will demonstate ability to  open jars with greater ease.   Status Partially Met  pt educated on available AE to assist with opening jars           OT Long Term Goals - 04/18/17 1026      OT LONG TERM GOAL #1   Title Independent with updated HEP due 05/08/17)    Status On-going     OT LONG TERM GOAL #2   Title Pt to improve RUE functional use as evidenced by performing 20 blocks or greater on Box & Blocks test   Baseline eval = 12 (Lt = 53)    Status On-going     OT LONG TERM GOAL #3   Title Pt independent with splint wear and care prn for R fingers PRN (pt may not need splints)   Status On-going     OT LONG TERM GOAL #4   Title Pt to use RUE as gross assist for all BADLS and bilateral tasks   Status On-going     OT LONG TERM GOAL #5   Title Pt will be able to print her name with R hand with AE prn   Status Achieved     OT LONG TERM GOAL #6   Title Pt will be able to eat at least 75% of her meals with her right hand.   Status Achieved     OT LONG TERM GOAL #7   Title Pt will demonstrate ability to use RUE  for functional tasks with min compensations for reach patterns and hand patterns.    Status On-going               Plan - 04/25/17 1312    Clinical Impression Statement Pt issued splint today for increased isolated finger extension while providing wrist and MP joint support.    Rehab Potential Good   OT Frequency 2x / week   OT Duration 8 weeks   OT Treatment/Interventions Self-care/ADL training;DME and/or AE instruction;Splinting;Patient/family education;Moist Heat;Therapeutic exercises;Therapeutic activities;Cryotherapy;Neuromuscular education;Passive range of motion;Cognitive remediation/compensation;Electrical Stimulation;Manual Therapy   Plan pt to bring in splint for further assessment w/ use during functional activities and to make adjustments prn. Continue NMR for RUE, visual compensation, functional use of RUE, manual therapy for tone reduction   Consulted and Agree with Plan of Care Patient      Patient will benefit from skilled therapeutic intervention in order to improve the following deficits and impairments:  Decreased coordination, Decreased range of motion, Impaired flexibility, Impaired sensation, Impaired tone, Decreased knowledge of precautions, Decreased knowledge of use of DME, Impaired UE functional use, Pain, Decreased strength, Decreased cognition  Visit Diagnosis: 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 [facility])  Other lack of coordination    Problem List Patient Active Problem List   Diagnosis Date Noted  . 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 Aphasia, late effect of cerebrovascular disease 03/14/2017  . Dysarthria as late effect of cerebrovascular accident (CVA) 03/14/2017  . Contracture of joint of finger of right hand 02/02/2017  . Right hand weakness 02/02/2017  . CVA (cerebral vascular accident) ([facility]) 09/29/2015  . HTN (hypertension) 09/29/2015  . Diabetes mellitus type 2, uncontrolled, with  complications ([facility]) 09/29/2015  . Tinea pedis 09/29/2015  . Eczema 09/29/2015    Kelli Churn, OTR/L 04/25/2017, 1:15 PM  [facility] [address] [place], [place], [address] Phone: [phone]   Fax:  [phone]  Name: GERALDYN SHAIN MRN: [id number] Date  of Birth: 11-11-1966

## 2017-04-25 NOTE — Patient Instructions (Signed)
SPLINT WEAR AND CARE  Your Splint This splint should initially be fitted by a healthcare practitioner.  The healthcare practitioner is responsible for providing wearing instructions and precautions to the patient, other healthcare practitioners and care provider involved in the patient's care.  This splint was custom made for you. Please read the following instructions to learn about wearing and caring for your splint.  Precautions Should your splint cause any of the following problems, remove the splint immediately and contact your therapist/physician.  Swelling  Severe Pain  Pressure Areas  Stiffness  Numbness  Do not wear your splint while operating machinery unless it has been fabricated for that purpose.  When To Wear Your Splint Where your splint according to your therapist/physician instructions. Begin by wearing splint during the day for 2 hours. Remove and check skin. If no problems, gradually increase wearing time during the day for functional activities with Rt hand. Do NOT wear at night  Care and Cleaning of Your Splint 1. Keep your splint away from open flames. 2. Your splint will lose its shape in temperatures over 135 degrees Farenheit, ( in car windows, near radiators, ovens or in hot water).  Never make any adjustments to your splint, if the splint needs adjusting remove it and make an appointment to see your therapist. 3. Your splint may be cleaned with rubbing alcohol. Make sure to clean and completely dry hand before reapplying.  Do not immerse in hot water over 135 degrees Farenheit. 4. Straps may be washed with soap and water, but do not moisten the self-adhesive portion. 5. For ink or hard to remove spots use a scouring cleanser which contains chlorine.  Rinse the splint thoroughly after using chlorine cleanser.

## 2017-04-27 ENCOUNTER — Ambulatory Visit: Payer: Medicare Other | Admitting: Occupational Therapy

## 2017-04-27 ENCOUNTER — Encounter: Payer: Self-pay | Admitting: Occupational Therapy

## 2017-04-27 DIAGNOSIS — IMO0002 Reserved for concepts with insufficient information to code with codable children: Secondary | ICD-10-CM

## 2017-04-27 DIAGNOSIS — I639 Cerebral infarction, unspecified: Secondary | ICD-10-CM | POA: Diagnosis not present

## 2017-04-27 DIAGNOSIS — M6281 Muscle weakness (generalized): Secondary | ICD-10-CM | POA: Diagnosis not present

## 2017-04-27 DIAGNOSIS — G8111 Spastic hemiplegia affecting right dominant side: Secondary | ICD-10-CM | POA: Diagnosis not present

## 2017-04-27 DIAGNOSIS — R293 Abnormal posture: Secondary | ICD-10-CM | POA: Diagnosis not present

## 2017-04-27 DIAGNOSIS — M25512 Pain in left shoulder: Secondary | ICD-10-CM | POA: Diagnosis not present

## 2017-04-27 DIAGNOSIS — R278 Other lack of coordination: Secondary | ICD-10-CM | POA: Diagnosis not present

## 2017-04-27 NOTE — Therapy (Signed)
[facility] [address] [place], [place], [address] Phone: [phone]   Fax:  [phone]  Occupational Therapy Treatment  Patient Details  Name: [name] MRN: [id number] Date of Birth: [birth year] Referring Provider: Dr. Boykin Nearing  Encounter Date: 04/27/2017      OT End of Session - 04/27/17 1618    Visit Number 13   Number of Visits 17   Date for OT Re-Evaluation 04/22/17   Authorization Type MCR - G code needed   Authorization - Visit Number 13   Authorization - Number of Visits 20   OT Start Time 1316   OT Stop Time 1359   OT Time Calculation (min) 43 min   Activity Tolerance Patient tolerated treatment well      Past Medical History:  Diagnosis Date  . Diabetes mellitus without complication ([place]) 4967   previously on [place]   . Gestational diabetes 2009   . Hypertension 09/07/2014  . Stroke ([place]) 09/07/2014   R side. weakness in upper arm. No deficit in leg. deficit in speech. A littel bit in coordination.     Past Surgical History:  Procedure Laterality Date  . BREAST SURGERY    . CESAREAN SECTION    . TUBAL LIGATION  2009    There were no vitals filed for this visit.      Subjective Assessment - 04/27/17 1323    Subjective  I can touch my fingers together now and pick up things with this splint   Pertinent History CHECK BP!!!! CVA 09/07/2014 with Rt dominant residual spastic hemiplegia   Currently in Pain? Yes   Pain Score 5    Pain Location Finger (Comment which one)  ring finger   Pain Orientation Left   Pain Descriptors / Indicators Sore   Pain Type Acute pain   Pain Onset In the past 7 days   Pain Frequency Intermittent   Aggravating Factors  when I try to bend my finger.    Pain Relieving Factors not sure                      OT Treatments/Exercises (OP) - 04/27/17 0001      Neurological Re-education Exercises   Other Exercises 2 Neuro re ed  to RUE  using splint to keep wrist in neutral and allow improved isolated finger flexion.  In this position, pt is able to open hand to pick up 1 inch cubes, small ball, small cone and large cone.  Pt is also able to release obejcts.  Pt does continue to have increased tone however tone is now primarily limited to index and middle finger with repetitive opening and closing.  Pt given home activity program to work on functional grasp and release. Pt also instructed to wear splint when using R hand and to allow for skin breaks when she is watching tv or sitting and visiting.  Pt verbalized understanding.  Pt has had signficant improvement in alignment, functional use and reduction of tone in wrist, thumb, ring and pinky finger.  Feel if pt could be reassessed for potential botox to address these two fingers that pt would benefit in terms of increased functional use.  Pt in agreement and will call MD tomorrow to schedule follow up appt.      Splinting   Splinting Adjusted MCP bar on splint to reduce bulk and increase MCP support.  Pt tolerating splint very well.  OT Education - 04/27/17 1540    Education provided Yes   Education Details functional grasp and release activities with splint on   Person(s) Educated Patient   Methods Explanation;Demonstration;Verbal cues   Comprehension Verbalized understanding;Returned demonstration          OT Short Term Goals - 04/27/17 1540      OT SHORT TERM GOAL #1   Title Independent with initial HEP for functional safe activities for Rt hand (due 04/10/17)    Status Achieved     OT SHORT TERM GOAL #2   Title Pt to verbalize understanding with safety techniques d/t loss of sensation Rt hand   Status Achieved     OT SHORT TERM GOAL #3   Title Pt to verbalize understanding with A/E to increase ease and safety with ADLS/IADLS   Status Achieved     OT SHORT TERM GOAL #4   Title Pt will demonstrate ability to eat at least 50% of her meals  with her R hand   Status Achieved     OT SHORT TERM GOAL #5   Title Pt will demonstrate abilty to pick up small objects with R hand (2 pt and 3 pt pinch) for functional tasks   Status Achieved     OT SHORT TERM GOAL #6   Title Pt will rate pain in L shoulder no more than 2/10 due to overuse issues with functional tasks.   Status Achieved     OT SHORT TERM GOAL #7   Title Pt will demonstate ability to open jars with greater ease.   Status Partially Met  pt educated on available AE to assist with opening jars           OT Long Term Goals - 04/27/17 1540      OT LONG TERM GOAL #1   Title Independent with updated HEP due 05/08/17)    Status On-going     OT LONG TERM GOAL #2   Title Pt to improve RUE functional use as evidenced by performing 20 blocks or greater on Box & Blocks test   Baseline eval = 12 (Lt = 53)    Status On-going     OT LONG TERM GOAL #3   Title Pt independent with splint wear and care prn for R fingers PRN (pt may not need splints)   Status Achieved     OT LONG TERM GOAL #4   Title Pt to use RUE as gross assist for all BADLS and bilateral tasks   Status On-going     OT LONG TERM GOAL #5   Title Pt will be able to print her name with R hand with AE prn   Status Achieved     OT LONG TERM GOAL #6   Title Pt will be able to eat at least 75% of her meals with her right hand.   Status Achieved     OT LONG TERM GOAL #7   Title Pt will demonstrate ability to use RUE for functional tasks with min compensations for reach patterns and hand patterns.    Status On-going               Plan - 04/27/17 1541    Clinical Impression Statement Pt continues to progress toward goals. Pt with improved grasp and release with current splint. Pt with increased tone primarily in index and middle finger of R hand with splint on   Rehab Potential Good   OT Frequency 2x / week  OT Duration 8 weeks   OT Treatment/Interventions Self-care/ADL training;DME and/or AE  instruction;Splinting;Patient/family education;Moist Heat;Therapeutic exercises;Therapeutic activities;Cryotherapy;Neuromuscular education;Passive range of motion;Cognitive remediation/compensation;Electrical Stimulation;Manual Therapy   Plan NMR with splint on for finger extension and grasp and release activities, bilateral hand activities, visual compensation, functional use of RUE, manual therapy for tone reduction.    Consulted and Agree with Plan of Care Patient      Patient will benefit from skilled therapeutic intervention in order to improve the following deficits and impairments:  Decreased coordination, Decreased range of motion, Impaired flexibility, Impaired sensation, Impaired tone, Decreased knowledge of precautions, Decreased knowledge of use of DME, Impaired UE functional use, Pain, Decreased strength, Decreased cognition  Visit Diagnosis: 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 [facility])  Other lack of coordination  Muscle weakness (generalized)  Acute pain of left shoulder  Abnormal posture    Problem List Patient Active Problem List   Diagnosis Date Noted  . 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 Aphasia, late effect of cerebrovascular disease 03/14/2017  . Dysarthria as late effect of cerebrovascular accident (CVA) 03/14/2017  . Contracture of joint of finger of right hand 02/02/2017  . Right hand weakness 02/02/2017  . CVA (cerebral vascular accident) ([place]) 09/29/2015  . HTN (hypertension) 09/29/2015  . Diabetes mellitus type 2, uncontrolled, with complications ([place]) 79/55/8316  . Tinea pedis 09/29/2015  . Eczema 09/29/2015    Quay Burow, OTR/L 04/27/2017, 4:20 PM  [facility] [address] [place], [place], [address] Phone: [phone]   Fax:  [phone]  Name: [name] MRN: [id number] Date of Birth: Jun 26, 1966

## 2017-05-01 ENCOUNTER — Encounter: Payer: Self-pay | Admitting: Occupational Therapy

## 2017-05-01 ENCOUNTER — Ambulatory Visit: Payer: Medicare Other | Admitting: Occupational Therapy

## 2017-05-01 VITALS — BP 160/92

## 2017-05-01 DIAGNOSIS — IMO0002 Reserved for concepts with insufficient information to code with codable children: Secondary | ICD-10-CM

## 2017-05-01 DIAGNOSIS — R278 Other lack of coordination: Secondary | ICD-10-CM

## 2017-05-01 DIAGNOSIS — M6281 Muscle weakness (generalized): Secondary | ICD-10-CM | POA: Diagnosis not present

## 2017-05-01 DIAGNOSIS — R293 Abnormal posture: Secondary | ICD-10-CM

## 2017-05-01 DIAGNOSIS — G8111 Spastic hemiplegia affecting right dominant side: Secondary | ICD-10-CM | POA: Diagnosis not present

## 2017-05-01 DIAGNOSIS — M25512 Pain in left shoulder: Secondary | ICD-10-CM | POA: Diagnosis not present

## 2017-05-01 DIAGNOSIS — I639 Cerebral infarction, unspecified: Secondary | ICD-10-CM | POA: Diagnosis not present

## 2017-05-01 NOTE — Therapy (Signed)
[facility] [address] [place], [place], [address] Phone: [phone]   Fax:  [phone]  Occupational Therapy Treatment  Patient Details  Name: HASINI PEACHEY MRN: [id number] Date of Birth: 03-16-1966 Referring Provider: Dr. Boykin Nearing  Encounter Date: 05/01/2017      OT End of Session - 05/01/17 0901    Visit Number 14   Number of Visits 17   Date for OT Re-Evaluation 05/08/17   Authorization Type MCR - G code needed   Authorization - Visit Number 14   Authorization - Number of Visits 20   OT Start Time 0802   OT Stop Time 0843   OT Time Calculation (min) 41 min   Activity Tolerance Patient tolerated treatment well      Past Medical History:  Diagnosis Date  . Diabetes mellitus without complication ([place]) 9794   previously on [place]   . Gestational diabetes 2009   . Hypertension 09/07/2014  . Stroke ([facility]) 09/07/2014   R side. weakness in upper arm. No deficit in leg. deficit in speech. A littel bit in coordination.     Past Surgical History:  Procedure Laterality Date  . BREAST SURGERY    . CESAREAN SECTION    . TUBAL LIGATION  2009    Vitals:   05/01/17 0803  BP: (!) 160/92        Subjective Assessment - 05/01/17 0807    Subjective  I forgot to take my BP medicine this morning   Pertinent History CHECK BP!!!! CVA 09/07/2014 with Rt dominant residual spastic hemiplegia   Currently in Pain? No/denies                      OT Treatments/Exercises (OP) - 05/01/17 0001      ADLs   ADL Comments Pt with elevated BP today - pt reports that she forgot to take her BP medicine this morning.  Pt also reports she inconsistently takes diabetic meds and cholesterol meds. Reviewed risk factors and warning signs/symptoms of stroke with pt and provided information in written format. Discussed in detail that risk of HTN, DM, and high cholesterol as well as pt's prior history of TIA and CVA to  reinforce with pt the importance of compliance with meds.  Pt able to verbalize understanding. Dicussed placing meds on kitchen table next to breakfast plate to remind herself as well as setting alarm on phone. Pt stated "I can do that."  Addressed remaining goals - see goal update for details. Also discussed potential for botox injection to hand to improve functional use - functionally spasticity predominantly in index and middle finger at this point however hand becomes tighter as pt uses it repetitively.  Pt again stated "I will call that dr." Will place pt on hold at this time until pt decides about botox injection with possible follow up appts should pt receive injection.  Pt in agreement with plan.  All information discussed was put in writing  and placed in a folder for pt due memory issues.  Pt also issued extra straps for splints.                 OT Education - 05/01/17 0859    Education provided Yes   Education Details warning signs/symptoms and risk factors of CVA; POC at this point moving forward.    Person(s) Educated Patient   Methods Explanation;Handout   Comprehension Verbalized understanding  OT Short Term Goals - 05/01/17 0900      OT SHORT TERM GOAL #1   Title Independent with initial HEP for functional safe activities for Rt hand (due 04/10/17)    Status Achieved     OT SHORT TERM GOAL #2   Title Pt to verbalize understanding with safety techniques d/t loss of sensation Rt hand   Status Achieved     OT SHORT TERM GOAL #3   Title Pt to verbalize understanding with A/E to increase ease and safety with ADLS/IADLS   Status Achieved     OT SHORT TERM GOAL #4   Title Pt will demonstrate ability to eat at least 50% of her meals with her R hand   Status Achieved     OT SHORT TERM GOAL #5   Title Pt will demonstrate abilty to pick up small objects with R hand (2 pt and 3 pt pinch) for functional tasks   Status Achieved     OT SHORT TERM GOAL #6   Title  Pt will rate pain in L shoulder no more than 2/10 due to overuse issues with functional tasks.   Status Achieved     OT SHORT TERM GOAL #7   Title Pt will demonstate ability to open jars with greater ease.   Status Partially Met  pt educated on available AE to assist with opening jars           OT Long Term Goals - 05/01/17 0900      OT LONG TERM GOAL #1   Title Independent with updated HEP due 05/08/17)    Status Achieved     OT LONG TERM GOAL #2   Title Pt to improve RUE functional use as evidenced by performing 20 blocks or greater on Box & Blocks test   Baseline eval = 12 (Lt = 53)    Status Achieved  23 blocks     OT LONG TERM GOAL #3   Title Pt independent with splint wear and care prn for R fingers PRN (pt may not need splints)   Status Achieved     OT LONG TERM GOAL #4   Title Pt to use RUE as gross assist for all BADLS and bilateral tasks   Status Achieved     OT LONG TERM GOAL #5   Title Pt will be able to print her name with R hand with AE prn   Status Achieved     OT LONG TERM GOAL #6   Title Pt will be able to eat at least 75% of her meals with her right hand.   Status Achieved     OT LONG TERM GOAL #7   Title Pt will demonstrate ability to use RUE for functional tasks with min compensations for reach patterns and hand patterns.    Status On-going               Plan - 05/01/17 0900    Clinical Impression Statement Pt has met all but one LTG.  Pt has made excellent progress and is using RUE much more functionally. Pt to be placed on hold at this time to possibly pursue botox injection to R hand.  Pt in agreeemnt with plan   OT Frequency 2x / week   OT Duration 8 weeks   OT Treatment/Interventions Self-care/ADL training;DME and/or AE instruction;Splinting;Patient/family education;Moist Heat;Therapeutic exercises;Therapeutic activities;Cryotherapy;Neuromuscular education;Passive range of motion;Cognitive remediation/compensation;Electrical  Stimulation;Manual Therapy   Plan place on hold until pt decides about possible botox  injection   Consulted and Agree with Plan of Care Patient      Patient will benefit from skilled therapeutic intervention in order to improve the following deficits and impairments:  Decreased coordination, Decreased range of motion, Impaired flexibility, Impaired sensation, Impaired tone, Decreased knowledge of precautions, Decreased knowledge of use of DME, Impaired UE functional use, Pain, Decreased strength, Decreased cognition  Visit Diagnosis: 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 [facility])  Other lack of coordination  Muscle weakness (generalized)  Acute pain of left shoulder  Abnormal posture    Problem List Patient Active Problem List   Diagnosis Date Noted  . Spastic monoplegia of upper extremity ([facility]) 03/14/2017  . Aphasia, late effect of cerebrovascular disease 03/14/2017  . Dysarthria as late effect of cerebrovascular accident (CVA) 03/14/2017  . Contracture of joint of finger of right hand 02/02/2017  . Right hand weakness 02/02/2017  . CVA (cerebral vascular accident) ([facility]) 09/29/2015  . HTN (hypertension) 09/29/2015  . Diabetes mellitus type 2, uncontrolled, with complications ([facility]) 29/52/8413  . Tinea pedis 09/29/2015  . Eczema 09/29/2015    Quay Burow, OTR/L 05/01/2017, 9:04 AM  [facility] [address] [place], [place], [address] Phone: [phone]   Fax:  [phone]  Name: TERRA AVENI MRN: [id number] Date of Birth: 1966-11-15

## 2017-05-02 ENCOUNTER — Telehealth: Payer: Self-pay | Admitting: Family Medicine

## 2017-05-02 NOTE — Telephone Encounter (Signed)
Called patient Verified name and DOB There is an EKG in her chart for 02/02/17 However I did not order or interpret and EKG that day as per visit orders, so I do not how this happened.  I apologized for the trouble.  Informed patient that I will investigate and figure out how to get it out of her chart and correct the billing. Patient agreed with plan and voiced understanding.

## 2017-05-02 NOTE — Telephone Encounter (Signed)
Pt called inquiring as to why she was billed for an EKG during a visit on 02/02/17 and would like reason why. Please f/u. Thank you.

## 2017-05-03 ENCOUNTER — Encounter: Payer: Self-pay | Admitting: Family Medicine

## 2017-05-04 ENCOUNTER — Encounter: Payer: Medicare Other | Admitting: Occupational Therapy

## 2017-05-05 ENCOUNTER — Encounter: Payer: Medicare Other | Attending: Physical Medicine & Rehabilitation

## 2017-05-05 ENCOUNTER — Encounter: Payer: Self-pay | Admitting: Physical Medicine & Rehabilitation

## 2017-05-05 ENCOUNTER — Ambulatory Visit (HOSPITAL_BASED_OUTPATIENT_CLINIC_OR_DEPARTMENT_OTHER): Payer: Medicare Other | Admitting: Physical Medicine & Rehabilitation

## 2017-05-05 VITALS — BP 156/89 | HR 84

## 2017-05-05 DIAGNOSIS — G8320 Monoplegia of upper limb affecting unspecified side: Secondary | ICD-10-CM | POA: Diagnosis not present

## 2017-05-05 DIAGNOSIS — I6300 Cerebral infarction due to thrombosis of unspecified precerebral artery: Secondary | ICD-10-CM | POA: Diagnosis not present

## 2017-05-05 DIAGNOSIS — M24541 Contracture, right hand: Secondary | ICD-10-CM | POA: Diagnosis not present

## 2017-05-05 MED ORDER — DIAZEPAM 10 MG PO TABS: 10.0000 mg | ORAL_TABLET | Freq: Once | ORAL | 0 refills | Status: AC

## 2017-05-05 MED FILL — diazePAM 10 MG TABS: 10 | 1 days supply | Qty: 1 | Fill #0

## 2017-05-05 NOTE — Progress Notes (Signed)
Subjective:    Patient ID: Guy SandiferSylvia L Bachus, female    DOB: 05/15/1966, 51 y.o.   MRN: [id number]  HPI 51 year old female with history of right spastic hemiplegia. He was initially evaluated 03/14/2017. She has been undergoing outpatient  OT. She is been making good progress. Procedure note from OT, patient has spasticity of the finger flexors in digits 2 and 3 primarily on the right hand.  Patient also notes some difficulty extending the right thumb. She no longer has difficulty with pronation and supination. Pain Inventory Average Pain 0 Pain Right Now 0 My pain is no pain  In the last 24 hours, has pain interfered with the following? General activity 5 Relation with others 10 Enjoyment of life 10 What TIME of day is your pain at its worst? no pain Sleep (in general) Fair  Pain is worse with: no pain Pain improves with: no pain Relief from Meds: no pain  Mobility how many minutes can you walk? 60 ability to climb steps?  yes do you drive?  yes  Function disabled: date disabled 02-16-17  Neuro/Psych No problems in this area  Prior Studies Any changes since last visit?  no  Physicians involved in your care Any changes since last visit?  no   Family History  Problem Relation Age of Onset  . Diabetes Mother   . Hypertension Mother   . Hypertension Sister    Social History   Social History  . Marital status: Married    Spouse name: N/A  . Number of children: N/A  . Years of education: N/A   Social History Main Topics  . Smoking status: Never Smoker  . Smokeless tobacco: Never Used  . Alcohol use No  . Drug use: No  . Sexual activity: Not on file   Other Topics Concern  . 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 BREAST SURGERY    . CESAREAN SECTION    . TUBAL LIGATION  2009   Past Medical History:  Diagnosis Date  . Diabetes mellitus without  complication ([facility]) 2013   previously on victoza   . Gestational diabetes 2009   . Hypertension 09/07/2014  . Stroke ([facility]) 09/07/2014   R side. weakness in upper arm. No deficit in leg. deficit in speech. A littel bit in coordination.    There were no vitals taken for this visit.  Opioid Risk Score:   Fall Risk Score:  `1  Depression screen PHQ 2/9  Dep[facility] screen [facility] 03/14/2017 03/02/2017 10/13/2016 09/29/2015  Decreased Interest 1 0 3 1  Down, Depressed, Hopeless 2 1 2  0  PHQ - 2 Score 3 1 5 1   Altered sleeping 1 1 2  -  Tired, decreased energy 3 3 0 -  Change in appetite 1 0 0 -  Feeling bad or failure about yourself  1 0 0 -  Trouble concentrating 1 0 0 -  Moving slowly or fidgety/restless 1 0 0 -  Suicidal thoughts 0 0 0 -  PHQ-9 Score 11 5 7  -  Difficult doing work/chores Somewhat difficult - - -    Review of Systems  Constitutional: Negative.   HENT: Negative.   Eyes: Negative.   Respiratory: Positive for cough.   Cardiovascular: Negative.   Gastrointestinal: Negative.   Endocrine: Negative.   Genitourinary: Negative.   Musculoskeletal: Negative.   Skin: Negative.  Allergic/Immunologic: Negative.   Neurological: Negative.   Hematological: Negative.   Psychiatric/Behavioral: Negative.        Objective:   Physical Exam  Constitutional: She is oriented to person, place, and time. She appears well-developed and well-nourished.  HENT:  Head: Normocephalic and atraumatic.  Eyes: Conjunctivae and EOM are normal. Pupils are equal, round, and reactive to light.  Neck: Normal range of motion.  Neurological: She is alert and oriented to person, place, and time.  Psychiatric: She has a normal mood and affect.  Nursing note and vitals reviewed.  MAS 3 in digits 2 and 3 of the right hand finger flexors. MES 2-3 right thumb flexor. MAS 1. Right forearm pronator  Manual muscle testing. 3 minus in the deltoid, biceps, triceps, grip, 2 minus, finger flexors and  finger extensors.       Assessment & Plan:  1. Right spastic monoplegia. Upper limb following left CVA.  She still has residual spasticity despite outpatient OT as well as oral anti-spasticity medication tizanidine. She has specifically finger flexor spasticity of the second and third digits of the right hand. We discussed targeting selectively those particular fascicles of the FDS and FDP using electrical stimulation. We discussed that this requires moving the needle electrode to different parts. The muscle to elicit the appropriate finger flexor response. She is quite anxious about this. We discussed that she can be premedicated with Valium 10 mg prior to injection.  Botox doses. Right FDS, 25 units Right FDP 50 units Right FPL 25 units  Discussed with patient, husband agree with plan

## 2017-05-05 NOTE — Patient Instructions (Signed)
Will give valium prior to injection

## 2017-05-08 ENCOUNTER — Encounter: Payer: Medicare Other | Admitting: Occupational Therapy

## 2017-05-11 ENCOUNTER — Encounter: Payer: Medicare Other | Admitting: Occupational Therapy

## 2017-05-12 ENCOUNTER — Ambulatory Visit: Payer: Medicare Other | Attending: Family Medicine | Admitting: Family Medicine

## 2017-05-12 ENCOUNTER — Encounter: Payer: Self-pay | Admitting: Family Medicine

## 2017-05-12 VITALS — BP 140/80 | HR 91 | Temp 98.1°F | Wt 186.6 lb

## 2017-05-12 DIAGNOSIS — R05 Cough: Secondary | ICD-10-CM | POA: Diagnosis not present

## 2017-05-12 DIAGNOSIS — I10 Essential (primary) hypertension: Secondary | ICD-10-CM

## 2017-05-12 DIAGNOSIS — I69351 Hemiplegia and hemiparesis following cerebral infarction affecting right dominant side: Secondary | ICD-10-CM | POA: Insufficient documentation

## 2017-05-12 DIAGNOSIS — R053 Chronic cough: Secondary | ICD-10-CM | POA: Insufficient documentation

## 2017-05-12 DIAGNOSIS — E1165 Type 2 diabetes mellitus with hyperglycemia: Secondary | ICD-10-CM | POA: Diagnosis not present

## 2017-05-12 DIAGNOSIS — E118 Type 2 diabetes mellitus with unspecified complications: Secondary | ICD-10-CM

## 2017-05-12 DIAGNOSIS — I6300 Cerebral infarction due to thrombosis of unspecified precerebral artery: Secondary | ICD-10-CM | POA: Diagnosis not present

## 2017-05-12 DIAGNOSIS — IMO0002 Reserved for concepts with insufficient information to code with codable children: Secondary | ICD-10-CM

## 2017-05-12 DIAGNOSIS — Z7984 Long term (current) use of oral hypoglycemic drugs: Secondary | ICD-10-CM | POA: Diagnosis not present

## 2017-05-12 LAB — GLUCOSE, POCT (MANUAL RESULT ENTRY): POC Glucose: 173 mg/dl — AB (ref 70–99)

## 2017-05-12 MED ORDER — AMLODIPINE BESYLATE 5 MG PO TABS: 5.0000 mg | ORAL_TABLET | Freq: Every day | ORAL | 3 refills | Status: DC

## 2017-05-12 MED ORDER — CETIRIZINE HCL 10 MG PO TABS: 10.0000 mg | ORAL_TABLET | Freq: Every day | ORAL | 0 refills | Status: DC

## 2017-05-12 MED FILL — ?AMLODIPINE BESYLATE 5 MG T: 5 | 30 days supply | Qty: 30 | Fill #0

## 2017-05-12 MED FILL — ?CETIRIZINE HCL 10 MG TABLE: 10 | 30 days supply | Qty: 30 | Fill #0

## 2017-05-12 NOTE — Patient Instructions (Addendum)
[name] was seen today for cough.  Diagnoses and all orders for this visit:  Uncontrolled type 2 diabetes mellitus with complication, without long-term current use of insulin ([facility]) -     POCT glucose (manual entry)  Essential hypertension -     amLODipine (NORVASC) 5 MG tablet; Take 1 tablet (5 mg total) by mouth daily.  Persistent dry cough -     cetirizine (ZYRTEC) 10 MG tablet; Take 1 tablet (10 mg total) by mouth daily.   If your cough is not better in one week of zyrtec please call and I will order chest x-ray Start norvasc 5 mg daily for HTN goal BP is < 130/80  F/u in 6 weeks for cough/ HTN  Dr. Armen PickupFunches

## 2017-05-12 NOTE — Telephone Encounter (Signed)
Chanel, This is the patient I told you about yesterday.  Please keep me updated on what you find.

## 2017-05-12 NOTE — Assessment & Plan Note (Signed)
A: persistent cough for 7 weeks,  no improvement with tessalon perles  Dicussed common causes: GERD, allergies P: Zyrtec 10 mg daily If no improvement plan for CXR

## 2017-05-12 NOTE — Progress Notes (Signed)
Subjective:  Patient ID: [name], female    DOB: 07-Sep-1966  Age: 51 y.o. MRN: [id number]  CC: Cough   HPI [name] has hx of CVA with R sided weakness, diabetes and HTN she  presents for    1. F/u cough: she was seen on 04/14/17 for 2-3 weeks of dry cough. No fever, chills, malaise. This followed dose increase of losartan-HCTZ from 50-12.5 mg to 100-25 mg. Also glipizide was added. She was given tessalon perles for cough and asked to f/u for recheck. She reports cough persist. She denies new symptoms. She had tried Claritin without improvement. The cough is random throughout the day. Her husband states the cough is worse at night.   2. HTN: taking hyzaar daily. Has dry cough. No CP, SOB or leg swelling. Eating low salt diet.   Social History  Substance Use Topics  . Smoking status: Never Smoker  . Smokeless tobacco: Never Used  . Alcohol use No    Outpatient Medications Prior to Visit  Medication Sig Dispense Refill  . atorvastatin (LIPITOR) 40 MG tablet Take 1 tablet (40 mg total) by mouth daily. 30 tablet 11  . benzonatate (TESSALON) 100 MG capsule Take 1 capsule (100 mg total) by mouth 3 (three) times daily as needed for cough. 60 capsule 0  . Blood Glucose Monitoring Suppl (TRUE METRIX METER) w/Device KIT 1 each by Does not apply route as needed. 1 kit 0  . glipiZIDE (GLUCOTROL) 5 MG tablet Take 1 tablet (5 mg total) by mouth 2 (two) times daily before a meal. 60 tablet 3  . glucose blood (TRUE METRIX BLOOD GLUCOSE TEST) test strip 1 each by Other route 2 (two) times daily. 100 each 11  . losartan-hydrochlorothiazide (HYZAAR) 50-12.5 MG tablet Take 2 tablets by mouth daily. 60 tablet 11  . sitaGLIPtin-metformin (JANUMET) 50-1000 MG tablet Take 1 tablet by mouth 2 (two) times daily with a meal. 60 tablet 5  . tiZANidine (ZANAFLEX) 2 MG tablet Take 1 tablet (2 mg total) by mouth 2 (two) times daily. 60 tablet 0  . TRUEPLUS LANCETS 28G MISC 1 each by Does not apply  route 2 (two) times daily. 100 each 11  . TURMERIC PO Take by mouth.     No facility-administered medications prior to visit.     ROS Review of Systems  Constitutional: Negative for chills and fever.  Eyes: Negative for visual disturbance.  Respiratory: Negative for shortness of breath.   Cardiovascular: Negative for chest pain.  Gastrointestinal: Negative for abdominal pain and blood in stool.  Musculoskeletal: Negative for arthralgias and back pain.  Skin: Negative for rash.  Allergic/Immunologic: Negative for immunocompromised state.  Hematological: Negative for adenopathy. Does not bruise/bleed easily.  Psychiatric/Behavioral: Negative for dysphoric mood and suicidal ideas.    Objective:  BP (!) 146/81   Pulse 91   Temp 98.1 F (36.7 C) (Oral)   Wt 186 lb 9.6 oz (84.6 kg)   SpO2 100%   BMI 37.69 kg/m   BP/Weight 05/12/2017 05/05/2017 6/81/2751  Systolic BP 700 174 944  Diastolic BP 81 89 92  Wt. (Lbs) 186.6 - -  BMI 37.69 - -     Physical Exam  Constitutional: She is oriented to person, place, and time. She appears well-developed and well-nourished. No distress.  HENT:  Head: Normocephalic and atraumatic.  Cardiovascular: Normal rate, regular rhythm, normal heart sounds and intact distal pulses.   Pulmonary/Chest: Effort normal and breath sounds normal.  Musculoskeletal: She  exhibits no edema.  Neurological: She is alert and oriented to person, place, and time.  Skin: Skin is warm and dry. No rash noted.  Psychiatric: She has a normal mood and affect.    Lab Results  Component Value Date   HGBA1C 9.9 03/02/2017   CBG 173  Assessment & Plan:  [name] was seen today for cough.  Diagnoses and all orders for this visit:  Uncontrolled type 2 diabetes mellitus with complication, without long-term current use of insulin ([facility]CC) -     POCT glucose (manual entry)  Essential hypertension -     amLODipine (NORVASC) 5 MG tablet; Take 1 tablet (5 mg total) by mouth  daily.  Persistent dry cough -     cetirizine (ZYRTEC) 10 MG tablet; Take 1 tablet (10 mg total) by mouth daily.   There are no diagnoses linked to this encounter.  No orders of the defined types were placed in this encounter.   Follow-up: Return in about 6 weeks (around 06/23/2017) for HTN and cough.   Boykin Nearing MD

## 2017-05-12 NOTE — Assessment & Plan Note (Signed)
A: improved. Goal is < 130/80 P: Continue hyzaar 100-25 mg daily Start norvasc 5 mg daily

## 2017-05-16 ENCOUNTER — Encounter: Payer: Medicare Other | Admitting: Occupational Therapy

## 2017-05-18 ENCOUNTER — Encounter: Payer: Medicare Other | Admitting: Occupational Therapy

## 2017-05-22 ENCOUNTER — Encounter: Payer: Medicare Other | Admitting: Occupational Therapy

## 2017-05-25 ENCOUNTER — Encounter: Payer: Medicare Other | Admitting: Occupational Therapy

## 2017-06-12 ENCOUNTER — Ambulatory Visit (HOSPITAL_BASED_OUTPATIENT_CLINIC_OR_DEPARTMENT_OTHER): Payer: Medicare Other | Admitting: Physical Medicine & Rehabilitation

## 2017-06-12 ENCOUNTER — Encounter: Payer: Medicare Other | Attending: Physical Medicine & Rehabilitation

## 2017-06-12 ENCOUNTER — Encounter: Payer: Self-pay | Admitting: Physical Medicine & Rehabilitation

## 2017-06-12 VITALS — BP 161/85 | HR 82 | Resp 14

## 2017-06-12 DIAGNOSIS — G8320 Monoplegia of upper limb affecting unspecified side: Secondary | ICD-10-CM | POA: Diagnosis not present

## 2017-06-12 DIAGNOSIS — M24541 Contracture, right hand: Secondary | ICD-10-CM | POA: Diagnosis not present

## 2017-06-12 DIAGNOSIS — I6300 Cerebral infarction due to thrombosis of unspecified precerebral artery: Secondary | ICD-10-CM | POA: Diagnosis not present

## 2017-06-12 NOTE — Progress Notes (Signed)
51 year old female with history of right spastic hemiplegia initially evaluated 03/14/2017. Follow-up appointment approximate one month ago. Has been undergoing outpatient OT. Has had some persistent spasticity and digits 2 and 3 of the right hand.  We discussed the Botox procedure. We discussed isolating muscle groups using electrical stimulation. We discussed that the Botox does not provide any improvement in sensation in the fingers. The goal is to reduce excessive spasticity, which limits. Extension of her second and third digits of the right hand. Examination MAS of 3 at the right middle finger. MAS 2 at the right index finger. MAS 0 at the thumb.  Patient has no pain with range of motion. She has incomplete closure of the hand as well.  Impression right spastic hemiplegia. We discussed recommendation for FDS and FDP injection. Patient would like to think about this. She will call if she wishes to schedule.

## 2017-06-12 NOTE — Progress Notes (Signed)
Subjective:    Patient ID: Jordan SandiferSylvia L Hill, female    DOB: [birth year], 51 y.o.   MRN: [id number]  HPI   Pain Inventory Average Pain 5 Pain Right Now 5 My pain is aching  In the last 24 hours, has pain interfered with the following? General activity 6 Relation with others 6 Enjoyment of life 2 What TIME of day is your pain at its worst? morning Sleep (in general) Fair  Pain is worse with: unsure Pain improves with: therapy/exercise Relief from Meds: 4  Mobility walk without assistance how many minutes can you walk? 60 ability to climb steps?  yes do you drive?  yes Do you have any goals in this area?  no  Function disabled: date disabled . Do you have any goals in this area?  no  Neuro/Psych No problems in this area  Prior Studies Any changes since last visit?  no  Physicians involved in your care Any changes since last visit?  no   Family History  Problem Relation Age of Onset  . Diabetes Mother   . Hypertension Mother   . Hypertension Sister    Social History   Social History  . Marital status: Married    Spouse name: N/A  . Number of children: N/A  . Years of education: N/A   Social History Main Topics  . Smoking status: Never Smoker  . Smokeless tobacco: Never Used  . Alcohol use No  . Drug use: No  . Sexual activity: Not Asked   Other Topics Concern  . 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 BREAST SURGERY    . CESAREAN SECTION    . TUBAL LIGATION  2009   Past Medical History:  Diagnosis Date  . Diabetes mellitus without complication ([facility]) 2013   previously on victoza   . Gestational diabetes 2009   . Hypertension 09/07/2014  . Stroke ([facility]) 09/07/2014   R side. weakness in upper arm. No deficit in leg. deficit in speech. A littel bit in coordination.    BP (!) 161/85   Pulse 82   Resp 14   SpO2 96%   Opioid Risk Score:   Fall  Risk Score:  `1  Depression screen PHQ 2/9  [facility] 05/12/2017 03/14/2017 03/02/2017 10/13/2016 09/29/2015  Decreased Interest 0 1 0 3 1  Down, Depressed, Hopeless 0 2 1 2  0  PHQ - 2 Score 0 3 1 5 1   Altered sleeping 0 1 1 2  -  Tired, decreased energy 1 3 3  0 -  Change in appetite 0 1 0 0 -  Feeling bad or failure about yourself  0 1 0 0 -  Trouble concentrating 0 1 0 0 -  Moving slowly or fidgety/restless 0 1 0 0 -  Suicidal thoughts 0 0 0 0 -  PHQ-9 Score 1 11 5 7  -  Difficult doing work/chores - Somewhat difficult - - -    Review of Systems  Constitutional: Negative.   HENT: Negative.   Eyes: Negative.   Respiratory: Negative.   Cardiovascular: Negative.   Gastrointestinal: Negative.   Endocrine: Negative.   Genitourinary: Negative.   Musculoskeletal:       Spasm   Skin: Negative.   Neurological: Negative.   Hematological: Negative.   Psychiatric/Behavioral: Negative.   All other systems reviewed and are negative.  Objective:   Physical Exam        Assessment & Plan:

## 2017-06-13 ENCOUNTER — Encounter (HOSPITAL_COMMUNITY): Payer: Self-pay | Admitting: Emergency Medicine

## 2017-06-13 ENCOUNTER — Ambulatory Visit (INDEPENDENT_AMBULATORY_CARE_PROVIDER_SITE_OTHER): Payer: Medicare Other

## 2017-06-13 ENCOUNTER — Telehealth: Payer: Self-pay | Admitting: *Deleted

## 2017-06-13 ENCOUNTER — Ambulatory Visit (HOSPITAL_COMMUNITY)
Admission: EM | Admit: 2017-06-13 | Discharge: 2017-06-13 | Disposition: A | Discharge status: Home / Self Care | Payer: Medicare Other | Attending: Family Medicine | Admitting: Family Medicine

## 2017-06-13 DIAGNOSIS — M79645 Pain in left finger(s): Secondary | ICD-10-CM | POA: Diagnosis not present

## 2017-06-13 DIAGNOSIS — M79642 Pain in left hand: Secondary | ICD-10-CM | POA: Diagnosis not present

## 2017-06-13 NOTE — Telephone Encounter (Addendum)
Patient Triage Assessment Form Todays Date: Name: Ramond MarrowSylvia Schellinger DOB: 1966-08-06  Reason for walkin: unable to move thumb or  Ring finger on left hand.   When did your symptoms start: 3 weeks ago Please list symptoms: right hand pain: thumb and 4th digit. Noticed thumb and ring finger locking.  Are you having pain: 8/10, no OTC    Assessment Vital Signs:  T: 99.4 P: 97  R: 20  SpO2: 100  BP: 160/88 right hand pain. Thumb and 4th digit stiff and painful to touch. Mild amount of swelling to 4th digit. Pt is unable to close hand to make a fist. Denies injury, numbness or tingling sensation in right arm.  Plan Appointment scheduled for provider visit on 06/14/2017 at 1015 but recommend to go to Urgent Care if sx's worsen.

## 2017-06-13 NOTE — ED Provider Notes (Signed)
  [facility]   [id number] 06/13/17 Arrival Time: 1605  ASSESSMENT & PLAN:  1. Finger pain, left    Likely inflammatory. She has ibuprofen 600mg  at home. Will take TID with food for 3-5 days. Return if not improving. Reviewed expectations re: course of current medical issues. Questions answered. Monitor BP. Patient verbalized understanding. After Visit Summary given.  SUBJECTIVE:  [name] is a 51 y.o. female who presents with complaint of left 4th finger pain and decreased ROM; "stiffness". Gradual onset over the past few days. No injury or trauma. No specific aggravating/alleviating factors. Removed ring after discomfort started. No swelling or sensation changes reported. No h/o similar. No self-treatment.  ROS: As per HPI.   OBJECTIVE:  Vitals:   06/13/17 1625  BP: (!) 172/76  Pulse: (!) 102  Resp: 20  Temp: 99.4 F (37.4 C)  TempSrc: Oral  SpO2: 100%     General appearance: alert, cooperative, appears stated age and no distress Neck: no adenopathy and supple, symmetrical, trachea midline Extremities: extremities normal, atraumatic, no cyanosis or edema Skin: Skin color, texture, turgor normal. No rashes or lesions  Imaging:  Dg Hand Complete Left  Result Date: 06/13/2017 CLINICAL DATA:  Patient with pain to the fourth digit and thumb. No known injury. Initial encounter. EXAM: LEFT HAND - COMPLETE 3+ VIEW COMPARISON:  None. FINDINGS: Normal anatomic alignment. No evidence for acute fracture or dislocation. Regional soft tissues unremarkable. IMPRESSION: No acute osseous abnormality. Electronically Signed   By: Annia Beltrew  Davis M.D.   On: 06/13/2017 17:10    No Known Allergies  PMHx, SurgHx, SocialHx, Medications, and Allergies were reviewed in the Visit Navigator and updated as appropriate.       Jordan LaymanHagler, Ennifer Harston, MD 06/13/17 [phone]

## 2017-06-13 NOTE — ED Triage Notes (Addendum)
[facility] with a complaint of pain to the ring finger on her left hand that has been hurting for 3 weeks and gotten worse over the last 3 days. The patient denied any known injury.

## 2017-06-14 ENCOUNTER — Ambulatory Visit: Payer: Medicare Other | Admitting: Family Medicine

## 2017-06-22 ENCOUNTER — Encounter: Payer: Self-pay | Admitting: Family Medicine

## 2017-06-22 ENCOUNTER — Ambulatory Visit: Payer: Medicare Other | Attending: Family Medicine | Admitting: Family Medicine

## 2017-06-22 VITALS — BP 147/80 | HR 91 | Temp 98.1°F | Ht 59.0 in | Wt 186.8 lb

## 2017-06-22 DIAGNOSIS — E1165 Type 2 diabetes mellitus with hyperglycemia: Secondary | ICD-10-CM | POA: Diagnosis not present

## 2017-06-22 DIAGNOSIS — I6300 Cerebral infarction due to thrombosis of unspecified precerebral artery: Secondary | ICD-10-CM

## 2017-06-22 DIAGNOSIS — M79645 Pain in left finger(s): Secondary | ICD-10-CM | POA: Diagnosis not present

## 2017-06-22 DIAGNOSIS — Z79899 Other long term (current) drug therapy: Secondary | ICD-10-CM | POA: Diagnosis not present

## 2017-06-22 DIAGNOSIS — R05 Cough: Secondary | ICD-10-CM | POA: Insufficient documentation

## 2017-06-22 DIAGNOSIS — I69351 Hemiplegia and hemiparesis following cerebral infarction affecting right dominant side: Secondary | ICD-10-CM | POA: Diagnosis not present

## 2017-06-22 DIAGNOSIS — M65312 Trigger thumb, left thumb: Secondary | ICD-10-CM | POA: Insufficient documentation

## 2017-06-22 DIAGNOSIS — E118 Type 2 diabetes mellitus with unspecified complications: Secondary | ICD-10-CM | POA: Diagnosis not present

## 2017-06-22 DIAGNOSIS — Z7984 Long term (current) use of oral hypoglycemic drugs: Secondary | ICD-10-CM | POA: Insufficient documentation

## 2017-06-22 DIAGNOSIS — IMO0002 Reserved for concepts with insufficient information to code with codable children: Secondary | ICD-10-CM

## 2017-06-22 DIAGNOSIS — R053 Chronic cough: Secondary | ICD-10-CM

## 2017-06-22 DIAGNOSIS — E119 Type 2 diabetes mellitus without complications: Secondary | ICD-10-CM | POA: Diagnosis not present

## 2017-06-22 DIAGNOSIS — I10 Essential (primary) hypertension: Secondary | ICD-10-CM | POA: Diagnosis not present

## 2017-06-22 LAB — POCT GLYCOSYLATED HEMOGLOBIN (HGB A1C): Hemoglobin A1C: 9.2

## 2017-06-22 LAB — GLUCOSE, POCT (MANUAL RESULT ENTRY): POC Glucose: 187 mg/dl — AB (ref 70–99)

## 2017-06-22 MED ORDER — GLIPIZIDE 10 MG PO TABS: 10.0000 mg | ORAL_TABLET | Freq: Two times a day (BID) | ORAL | 5 refills | Status: DC

## 2017-06-22 MED ORDER — PREDNISONE 10 MG PO TABS: ORAL_TABLET | ORAL | 0 refills | Status: DC

## 2017-06-22 MED ORDER — SITAGLIPTIN PHOS-METFORMIN HCL 50-1000 MG PO TABS: 1.0000 | ORAL_TABLET | Freq: Two times a day (BID) | ORAL | 5 refills | Status: DC

## 2017-06-22 MED ORDER — AMLODIPINE BESYLATE 10 MG PO TABS: 10.0000 mg | ORAL_TABLET | Freq: Every day | ORAL | 5 refills | Status: DC

## 2017-06-22 MED ORDER — FINGER SPLINT MISC: 1.0000 | Freq: Every day | 0 refills | Status: DC

## 2017-06-22 MED FILL — ?AMLODIPINE BESYLATE 10 MG: 10 | 30 days supply | Qty: 30 | Fill #0

## 2017-06-22 MED FILL — ?PREDNISONE 10 MG TABLET: 10 | 9 days supply | Qty: 21 | Fill #0

## 2017-06-22 MED FILL — glipiZIDE 10 MG TABS: 10 | 30 days supply | Qty: 60 | Fill #0

## 2017-06-22 NOTE — Patient Instructions (Addendum)
Jordan ElmSylvia was seen today for hypertension and cough.  Diagnoses and all orders for this visit:  Uncontrolled type 2 diabetes mellitus with complication, without long-term current use of insulin (HCC) -     POCT glucose (manual entry) -     POCT glycosylated hemoglobin (Hb A1C) -     glipiZIDE (GLUCOTROL) 10 MG tablet; Take 1 tablet (10 mg total) by mouth 2 (two) times daily before a meal. -     sitaGLIPtin-metformin (JANUMET) 50-1000 MG tablet; Take 1 tablet by mouth 2 (two) times daily with a meal.  Essential hypertension -     amLODipine (NORVASC) 10 MG tablet; Take 1 tablet (10 mg total) by mouth daily.  Trigger finger of left thumb -     predniSONE (DELTASONE) 10 MG tablet; Take by mouth 4 tablets for 2 days, 3 tablets for 2 days, 2 tablets for 2 days, 1 tablet for 3 days then STOP  Pain in finger of left hand -     predniSONE (DELTASONE) 10 MG tablet; Take by mouth 4 tablets for 2 days, 3 tablets for 2 days, 2 tablets for 2 days, 1 tablet for 3 days then STOP   A trigger finger splint on your left thumb and ring finger will be helpful  Information about ketogenic diet  [email or web address] Increase magnesium and potassium  Drink a lot of water  F/u in 10 days for finger pain   Dr. Armen PickupFunches    Trigger Finger Trigger finger (stenosing tenosynovitis) is a condition that causes a finger to get stuck in a bent position. Each finger has a tough, cord-like tissue that connects muscle to bone (tendon), and each tendon is surrounded by a tunnel of tissue (tendon sheath). To move your finger, your tendon needs to slide freely through the sheath. Trigger finger happens when the tendon or the sheath thickens, making it difficult to move your finger. Trigger finger can affect any finger or a thumb. It may affect more than one finger. Mild cases may clear up with rest and medicine. Severe cases require more treatment. What are the causes? Trigger finger is caused by a thickened finger tendon  or tendon sheath. The cause of this thickening is not known. What increases the risk? The following factors may make you more likely to develop this condition:  Doing activities that require a strong grip.  Having rheumatoid arthritis, gout, or diabetes.  Being 6440-49 years old.  Being a woman.  What are the signs or symptoms? Symptoms of this condition include:  Pain when bending or straightening your finger.  Tenderness or swelling where your finger attaches to the palm of your hand.  A lump in the palm of your hand or on the inside of your finger.  Hearing a popping sound when you try to straighten your finger.  Feeling a popping, catching, or locking sensation when you try to straighten your finger.  Being unable to straighten your finger.  How is this diagnosed? This condition is diagnosed based on your symptoms and a physical exam. How is this treated? This condition may be treated by:  Resting your finger and avoiding activities that make symptoms worse.  Wearing a finger splint to keep your finger in a slightly bent position.  Taking NSAIDs to relieve pain and swelling.  Injecting medicine (steroids) into the tendon sheath to reduce swelling and irritation. Injections may need to be repeated.  Having surgery to open the tendon sheath. This may be done if other treatments do not  work and you cannot straighten your finger. You may need physical therapy after surgery.  Follow these instructions at home:  Use moist heat to help reduce pain and swelling as told by your health care provider.  Rest your finger and avoid activities that make pain worse. Return to normal activities as told by your health care provider.  If you have a splint, wear it as told by your health care provider.  Take over-the-counter and prescription medicines only as told by your health care provider.  Keep all follow-up visits as told by your health care provider. This is  important. Contact a health care provider if:  Your symptoms are not improving with home care. Summary  Trigger finger (stenosing tenosynovitis) causes your finger to get stuck in a bent position, and it can make it difficult and painful to straighten your finger.  This condition develops when a finger tendon or tendon sheath thickens.  Treatment starts with resting, wearing a splint, and taking NSAIDs.  In severe cases, surgery to open the tendon sheath may be needed. This information is not intended to replace advice given to you by your health care provider. Make sure you discuss any questions you have with your health care provider. Document Released: 09/24/2004 Document Revised: 11/15/2016 Document Reviewed: 11/15/2016 Elsevier Interactive Patient Education  2017 [employer].

## 2017-06-22 NOTE — Progress Notes (Signed)
Subjective:  Patient ID: [name], female    DOB: [birth year]  Age: 51 y.o. MRN: [id number]  CC: Hypertension and Cough   HPI [name] has hx of CVA with R sided weakness, diabetes and HTN she  presents for    1. F/u cough: she was seen on 04/14/17 for 2-3 weeks of dry cough. No fever, chills, malaise. This followed dose increase of losartan-HCTZ from 50-12.5 mg to 100-25 mg. Also glipizide was added. She was given tessalon perles for cough and asked to f/u for recheck. She reports cough persist. She denies new symptoms. She had tried Claritin without improvement. The cough is random throughout the day. Her husband states the cough is worse at night. She denies reflux symptoms. She has stopped losartan-HCTZ with slight improvement in cough.   2. HTN: taking  100-25 mg daily and amlodipine 5 mg daily. Has dry cough. No CP, SOB or leg swelling. Eating low salt diet.   3. Left ring finger pain: x 2 weeks. No known injury.  Associated with stiffness. She went to urgent care on 06/13/17 for the pain. There was no associated swelling. X-ray was obtained and normal. She was advised to ibuprofen 600 mg TID with food for 3-5 days for pain. She took ibuprofen without relief of pain. Massage helps. She also has popping of R thumb at the IP joint. This is not painful.   Social History  Substance Use Topics  . Smoking status: Never Smoker  . Smokeless tobacco: Never Used  . Alcohol use No    Outpatient Medications Prior to Visit  Medication Sig Dispense Refill  . amLODipine (NORVASC) 5 MG tablet Take 1 tablet (5 mg total) by mouth daily. 30 tablet 3  . atorvastatin (LIPITOR) 40 MG tablet Take 1 tablet (40 mg total) by mouth daily. 30 tablet 11  . Blood Glucose Monitoring Suppl (TRUE METRIX METER) w/Device KIT 1 each by Does not apply route as needed. 1 kit 0  . cetirizine (ZYRTEC) 10 MG tablet Take 1 tablet (10 mg total) by mouth daily. 30 tablet 0  . glipiZIDE (GLUCOTROL) 5 MG tablet Take  1 tablet (5 mg total) by mouth 2 (two) times daily before a meal. 60 tablet 3  . glucose blood (TRUE METRIX BLOOD GLUCOSE TEST) test strip 1 each by Other route 2 (two) times daily. 100 each 11  . losartan-hydrochlorothiazide (HYZAAR) 50-12.5 MG tablet Take 2 tablets by mouth daily. 60 tablet 11  . sitaGLIPtin-metformin (JANUMET) 50-1000 MG tablet Take 1 tablet by mouth 2 (two) times daily with a meal. 60 tablet 5  . tiZANidine (ZANAFLEX) 2 MG tablet Take 1 tablet (2 mg total) by mouth 2 (two) times daily. 60 tablet 0  . TRUEPLUS LANCETS 28G MISC 1 each by Does not apply route 2 (two) times daily. 100 each 11  . TURMERIC PO Take by mouth.     No facility-administered medications prior to visit.     ROS Review of Systems  Constitutional: Negative for chills and fever.  Eyes: Negative for visual disturbance.  Respiratory: Positive for cough. Negative for shortness of breath.   Cardiovascular: Negative for chest pain.  Gastrointestinal: Negative for abdominal pain and blood in stool.  Musculoskeletal: Positive for arthralgias. Negative for back pain.  Skin: Negative for rash.  Allergic/Immunologic: Negative for immunocompromised state.  Hematological: Negative for adenopathy. Does not bruise/bleed easily.  Psychiatric/Behavioral: Negative for dysphoric mood and suicidal ideas.    Objective:  BP (!) 147/80  Pulse 91   Temp 98.1 F (36.7 C) (Oral)   Ht _0  (1.499 m)   Wt 186 lb 12.8 oz (84.7 kg)   LMP 05/19/2017   SpO2 100%   BMI 37.73 kg/m   BP/Weight 06/22/2017 06/13/2017 09/02/568  Systolic BP 794 801 655  Diastolic BP 80 76 85  Wt. (Lbs) 186.8 - -  BMI 37.73 - -   Wt Readings from Last 3 Encounters:  06/22/17 186 lb 12.8 oz (84.7 kg)  05/12/17 186 lb 9.6 oz (84.6 kg)  04/14/17 185 lb 9.6 oz (84.2 kg)    Physical Exam  Constitutional: She is oriented to person, place, and time. She appears well-developed and well-nourished. No distress.  HENT:  Head: Normocephalic  and atraumatic.  Cardiovascular: Normal rate, regular rhythm, normal heart sounds and intact distal pulses.   Pulmonary/Chest: Effort normal and breath sounds normal.  Musculoskeletal: She exhibits no edema.       Hands: Neurological: She is alert and oriented to person, place, and time.  Skin: Skin is warm and dry. No rash noted.  Psychiatric: She has a normal mood and affect.    Lab Results  Component Value Date   HGBA1C 9.9 03/02/2017   Lab Results  Component Value Date   HGBA1C 9.2 06/22/2017    CBG 187 Assessment & Plan:  [name] was seen today for hypertension and cough.  Diagnoses and all orders for this visit:  Uncontrolled type 2 diabetes mellitus with complication, without long-term current use of insulin (HCC) -     POCT glucose (manual entry) -     POCT glycosylated hemoglobin (Hb A1C) -     glipiZIDE (GLUCOTROL) 10 MG tablet; Take 1 tablet (10 mg total) by mouth 2 (two) times daily before a meal. -     sitaGLIPtin-metformin (JANUMET) 50-1000 MG tablet; Take 1 tablet by mouth 2 (two) times daily with a meal.  Essential hypertension -     amLODipine (NORVASC) 10 MG tablet; Take 1 tablet (10 mg total) by mouth daily.  Trigger finger of left thumb -     predniSONE (DELTASONE) 10 MG tablet; Take by mouth 4 tablets for 2 days, 3 tablets for 2 days, 2 tablets for 2 days, 1 tablet for 3 days then STOP -     Elastic Bandages & Supports (FINGER SPLINT) MISC; 1 each by Does not apply route daily. On Left thumb and ring finger  Pain in finger of left hand -     predniSONE (DELTASONE) 10 MG tablet; Take by mouth 4 tablets for 2 days, 3 tablets for 2 days, 2 tablets for 2 days, 1 tablet for 3 days then STOP -     Elastic Bandages & Supports (FINGER SPLINT) MISC; 1 each by Does not apply route daily. On Left thumb and ring finger   There are no diagnoses linked to this encounter.  No orders of the defined types were placed in this encounter.   Follow-up: Return in about  10 days (around 07/02/2017) for trigger finger.   Boykin Nearing MD

## 2017-06-22 NOTE — Assessment & Plan Note (Signed)
Pain at 4th MCP without redness or swelling Prednisone taper for suspected tendonitis  Trigger finger brace

## 2017-06-22 NOTE — Assessment & Plan Note (Signed)
A: improved type 2 diabetes, patient is a vegan P: Increase glipizide to 10 mg BID Continue janumet 50-1000 mg BID

## 2017-06-22 NOTE — Assessment & Plan Note (Signed)
Pain at IP joint  without redness or swelling Prednisone taper for suspected tendonitis  Trigger finger brace

## 2017-06-22 NOTE — Assessment & Plan Note (Signed)
A: persistent cough. No improvement with tessalon perles or zyrtec. Patient denies GERD. P: CXR ordered

## 2017-06-22 NOTE — Assessment & Plan Note (Signed)
A: improved but still elevated Med: compliant P: Increase Norvasc to 10 mg daily

## 2017-07-21 ENCOUNTER — Ambulatory Visit: Payer: Medicare Other | Admitting: Family Medicine

## 2017-08-02 ENCOUNTER — Ambulatory Visit (HOSPITAL_COMMUNITY)
Admission: EM | Admit: 2017-08-02 | Discharge: 2017-08-02 | Disposition: A | Discharge status: Home / Self Care | Payer: Medicare Other | Attending: Family Medicine | Admitting: Family Medicine

## 2017-08-02 ENCOUNTER — Encounter (HOSPITAL_COMMUNITY): Payer: Self-pay | Admitting: Emergency Medicine

## 2017-08-02 DIAGNOSIS — G43009 Migraine without aura, not intractable, without status migrainosus: Secondary | ICD-10-CM | POA: Diagnosis not present

## 2017-08-02 MED ORDER — BUTALBITAL-APAP-CAFFEINE 50-325-40 MG PO TABS: 1.0000 | ORAL_TABLET | Freq: Four times a day (QID) | ORAL | 0 refills | Status: DC | PRN

## 2017-08-02 MED ORDER — KETOROLAC TROMETHAMINE 30 MG/ML IJ SOLN
30.0000 mg | Freq: Once | INTRAMUSCULAR | Status: AC
  Administered 2017-08-02: 30 mg via INTRAMUSCULAR

## 2017-08-02 MED ORDER — KETOROLAC TROMETHAMINE 30 MG/ML IJ SOLN
INTRAMUSCULAR | Status: AC
  Filled 2017-08-02: qty 1

## 2017-08-02 MED ORDER — DEXAMETHASONE SODIUM PHOSPHATE 10 MG/ML IJ SOLN
10.0000 mg | Freq: Once | INTRAMUSCULAR | Status: AC
  Administered 2017-08-02: 10 mg via INTRAMUSCULAR

## 2017-08-02 MED ORDER — DEXAMETHASONE SODIUM PHOSPHATE 10 MG/ML IJ SOLN
INTRAMUSCULAR | Status: AC
  Filled 2017-08-02: qty 1

## 2017-08-02 NOTE — ED Provider Notes (Signed)
  [facility]   [id number] 08/02/17 Arrival Time: 1559   SUBJECTIVE:  Jordan SandiferSylvia L Hill is a 51 y.o. female who presents to the urgent care  with complaint of headache syndrome the left side of her head that is been ongoing for a week and a half. She does have a past history of headaches, and states she is to be under the care of a neurologist for these headaches. These symptoms are consistent with past headaches. Approximately one year ago she had an ischemic stroke affecting her right side and her speech, at she states she has since recovered from. She has no complaint of numbness, tingling, blurred vision, difficulty speaking, or pain with movement of the neck currently. She states she thinks this is "stress" as it gets worse when she has "yelling at her kids" she complains of light sensitivity, preferring to rest in a dark, quiet environment, has had no fever, headache is not associated with foods, and not worsened with defecation. History is otherwise negative  ROS: As per HPI, remainder of ROS negative.   OBJECTIVE:  Vitals:   08/02/17 1703 08/02/17 1705  BP: (!) 148/68   Pulse: (!) 102   Resp: 16   Temp: 98.7 F (37.1 C)   TempSrc: Oral   SpO2: 100%   Weight:  185 lb (83.9 kg)  Height:  5' (1.524 m)     General appearance: alert; no distress HEENT: normocephalic; atraumatic; conjunctivae normal, no notable papilledema;  Neck: No JVD sitting upright, no cervical lymphadenopathy Lungs: clear to auscultation bilaterally Heart: regular rate and rhythm Abdomen: soft, non-tender; bowel sounds normal; no masses or organomegaly; no guarding or rebound tenderness Musculoskeletal:  Skin: warm and dry Neurologic: Grossly normal, patellar reflexes 3 out of 5 bilaterally  Cranial Nerves:  II: peripheral fields grossly intact III,IV, VI: ptosis not present, extra-ocular movements intact bilaterally, direct and consensual pupillary light reflexes intact bilaterally V: facial  sensation, jaw opening, and bite strength equal bilaterally VII: eyebrow raise, eyelid close, smile, frown, pucker equal bilaterally VIII: hearing grossly normal bilaterally  IX,X: palate elevation and swallowing intact XI: bilateral shoulder shrug and lateral head rotation equal and strong XII: midline tongue extension  Psychological:  alert and cooperative; normal mood and affect     ASSESSMENT & PLAN:  1. Migraine without aura and without status migrainosus, not intractable     Meds ordered this encounter  Medications  . ketorolac (TORADOL) 30 MG/ML injection 30 mg  . dexamethasone (DECADRON) injection 10 mg  . butalbital-acetaminophen-caffeine (FIORICET, ESGIC) 50-325-40 MG tablet    Sig: Take 1-2 tablets by mouth every 6 (six) hours as needed for headache.    Dispense:  20 tablet    Refill:  0    Order Specific Question:   Supervising Provider    Answer:   Mardella LaymanHAGLER, BRIAN [[id number]]    Reviewed expectations re: course of current medical issues. Questions answered. Outlined signs and symptoms indicating need for more acute intervention. Patient verbalized understanding. After Visit Summary given.    Procedures:  No results found.  No Known Allergies  PMHx, SurgHx, SocialHx, Medications, and Allergies were reviewed in the Visit Navigator and updated as appropriate.       Dorena BodoKennard, Robet Crutchfield, NP 08/02/17 1753

## 2017-08-02 NOTE — ED Triage Notes (Signed)
PT reports lower left head headache for 1 week. PT states, "I had a stroke 2 years ago and this feels worse." PT denies weakness, difficulty with speeech. PT denies history of migraines.

## 2017-08-02 NOTE — Discharge Instructions (Signed)
Your symptoms are consistent with common migraine. If your headache worsens, fails to resolve, or if you develop stroke like symptoms such as numbness, tingling, blurred vision, or difficulty speaking, go to the emergency room. You have been given an injection or Toradol and Decadron here for pain and a short course of Fiorcet. This medication can be habit forming. Do not drive while taking and do not take more than what is needed.

## 2017-08-03 ENCOUNTER — Ambulatory Visit: Payer: Medicare Other | Attending: Internal Medicine | Admitting: Physician Assistant

## 2017-08-03 ENCOUNTER — Encounter: Payer: Self-pay | Admitting: Neurology

## 2017-08-03 VITALS — BP 149/73 | HR 91 | Temp 97.9°F | Resp 18 | Ht 59.0 in | Wt 186.2 lb

## 2017-08-03 DIAGNOSIS — IMO0002 Reserved for concepts with insufficient information to code with codable children: Secondary | ICD-10-CM

## 2017-08-03 DIAGNOSIS — R51 Headache: Secondary | ICD-10-CM | POA: Diagnosis present

## 2017-08-03 DIAGNOSIS — E1165 Type 2 diabetes mellitus with hyperglycemia: Secondary | ICD-10-CM | POA: Diagnosis not present

## 2017-08-03 DIAGNOSIS — Z8673 Personal history of transient ischemic attack (TIA), and cerebral infarction without residual deficits: Secondary | ICD-10-CM | POA: Insufficient documentation

## 2017-08-03 DIAGNOSIS — I6300 Cerebral infarction due to thrombosis of unspecified precerebral artery: Secondary | ICD-10-CM | POA: Diagnosis not present

## 2017-08-03 DIAGNOSIS — E118 Type 2 diabetes mellitus with unspecified complications: Secondary | ICD-10-CM | POA: Diagnosis not present

## 2017-08-03 DIAGNOSIS — G43909 Migraine, unspecified, not intractable, without status migrainosus: Secondary | ICD-10-CM | POA: Diagnosis not present

## 2017-08-03 DIAGNOSIS — G43019 Migraine without aura, intractable, without status migrainosus: Secondary | ICD-10-CM

## 2017-08-03 LAB — GLUCOSE, POCT (MANUAL RESULT ENTRY): POC GLUCOSE: 339 mg/dL — AB (ref 70–99)

## 2017-08-03 MED FILL — BUTALB-ACETAMIN-CAFF 50-325: 50-325-40 | 3 days supply | Qty: 20 | Fill #0

## 2017-08-03 NOTE — Progress Notes (Signed)
Patient is here for headaches.

## 2017-08-03 NOTE — Progress Notes (Signed)
Chief Complaint: headache  Subjective: This 51 year old lady presents to the emergency department on yesterday with a headache ongoing for 1-2 weeks. She has a history of seen neurology in the past when she had her stroke. This was in [place] then however. She described a left-sided headache without numbness or tingling. No blurred vision. Sensitive to light. No nausea or vomiting. Has experienced these before but is not chronically taking any medication. No labs or imaging done in the emergency department. She was treated with Toradol and Decadron. She was given a prescription for Fioricet which she picked up today. She has not had any yet though.  She has a vague headache right now not as quite as bad as yesterday. Asking to see a neurologist again.   ROS:  GEN: denies fever or chills, denies change in weight Skin: denies lesions or rashes HEENT: + headache, earache, epistaxis, sore throat, or neck pain LUNGS: denies SHOB, dyspnea, PND, orthopnea CV: denies CP or palpitations ABD: denies abd pain, N or V EXT: denies muscle spasms or swelling; no pain in lower ext, no weakness NEURO: denies numbness or tingling, denies sz, stroke or TIA   Objective:  Vitals:   08/03/17 1509  BP: (!) 149/73  Pulse: 91  Resp: 18  Temp: 97.9 F (36.6 C)  TempSrc: Oral  SpO2: 97%  Weight: 186 lb 3.2 oz (84.5 kg)  Height: _0  (1.499 m)    Physical Exam:  General: in no acute distress. HEENT: no pallor, no icterus, moist oral mucosa, no JVD, no lymphadenopathy Heart: Normal  s1 &s2  Regular rate and rhythm, without murmurs, rubs, gallops. Lungs: Clear to auscultation bilaterally. Abdomen: Soft, nontender, nondistended, positive bowel sounds. Extremities: No clubbing cyanosis or edema with positive pedal pulses. Neuro: Alert, awake, oriented x3, nonfocal.   Medications: Prior to Admission medications   Medication Sig Start Date End Date Taking? Authorizing Provider  amLODipine  (NORVASC) 10 MG tablet Take 1 tablet (10 mg total) by mouth daily. 06/22/17  Yes Funches, Josalyn, MD  atorvastatin (LIPITOR) 40 MG tablet Take 1 tablet (40 mg total) by mouth daily. 10/13/16  Yes Funches, Josalyn, MD  Blood Glucose Monitoring Suppl (TRUE METRIX METER) w/Device KIT 1 each by Does not apply route as needed. 10/13/16  Yes Funches, Josalyn, MD  glipiZIDE (GLUCOTROL) 10 MG tablet Take 1 tablet (10 mg total) by mouth 2 (two) times daily before a meal. 06/22/17  Yes Funches, Josalyn, MD  glucose blood (TRUE METRIX BLOOD GLUCOSE TEST) test strip 1 each by Other route 2 (two) times daily. 10/13/16  Yes Funches, Adriana Mccallum, MD  sitaGLIPtin-metformin (JANUMET) 50-1000 MG tablet Take 1 tablet by mouth 2 (two) times daily with a meal. 06/22/17  Yes Funches, Josalyn, MD  TRUEPLUS LANCETS 28G MISC 1 each by Does not apply route 2 (two) times daily. 10/13/16  Yes Funches, Adriana Mccallum, MD  TURMERIC PO Take by mouth.   Yes [provider]  butalbital-acetaminophen-caffeine (FIORICET, ESGIC) 50-325-40 MG tablet Take 1-2 tablets by mouth every 6 (six) hours as needed for headache. Patient not taking: Reported on 08/03/2017 08/02/17 08/02/18  Barnet Glasgow, NP  Elastic Bandages & Supports (FINGER SPLINT) MISC 1 each by Does not apply route daily. On Left thumb and ring finger Patient not taking: Reported on 08/03/2017 06/22/17   Boykin Nearing, MD  predniSONE (DELTASONE) 10 MG tablet Take by mouth 4 tablets for 2 days, 3 tablets for 2 days, 2 tablets for 2 days, 1 tablet for 3 days then  STOP Patient not taking: Reported on 08/03/2017 06/22/17   Boykin Nearing, MD   Assessment:  Migraine   Plan: Fioricet prn Offered maintenance meds but she decline Requesting to see Neurologist, referral placed  Follow up:4 weeks  The patient was given clear instructions to go to ER or return to medical center if symptoms don't improve, worsen or new problems develop. The patient verbalized understanding. The  patient was told to call to get lab results if they haven't heard anything in the next week.   This note has been created with Surveyor, quantity. Any transcriptional errors are unintentional.   Zettie Pho, PA-C 08/03/2017, 3:21 PM

## 2017-09-14 ENCOUNTER — Encounter: Payer: Self-pay | Admitting: Occupational Therapy

## 2017-09-14 DIAGNOSIS — IMO0002 Reserved for concepts with insufficient information to code with codable children: Secondary | ICD-10-CM

## 2017-09-14 NOTE — Therapy (Signed)
[facility] [address] [place], [place], [address] Phone: [phone]   Fax:  [phone]  Occupational Therapy Treatment  Patient Details  Name: [name] MRN: [id number] Date of Birth: 01-22-1966 Referring Provider: Dr. Boykin Nearing  Encounter Date: 09/14/2017    Past Medical History:  Diagnosis Date  . Diabetes mellitus without complication ([place]) 2774   previously on [place]   . Gestational diabetes 2009   . Hypertension 09/07/2014  . Stroke ([place]) 09/07/2014   R side. weakness in upper arm. No deficit in leg. deficit in speech. A littel bit in coordination.     Past Surgical History:  Procedure Laterality Date  . BREAST SURGERY    . CESAREAN SECTION    . TUBAL LIGATION  2009    There were no vitals filed for this visit.                              OT Short Term Goals - 09/14/17 1287      OT SHORT TERM GOAL #1   Title Independent with initial HEP for functional safe activities for Rt hand (due 04/10/17)    Status Achieved     OT SHORT TERM GOAL #2   Title Pt to verbalize understanding with safety techniques d/t loss of sensation Rt hand   Status Achieved     OT SHORT TERM GOAL #3   Title Pt to verbalize understanding with A/E to increase ease and safety with ADLS/IADLS   Status Achieved     OT SHORT TERM GOAL #4   Title Pt will demonstrate ability to eat at least 50% of her meals with her R hand   Status Achieved     OT SHORT TERM GOAL #5   Title Pt will demonstrate abilty to pick up small objects with R hand (2 pt and 3 pt pinch) for functional tasks   Status Achieved     OT SHORT TERM GOAL #6   Title Pt will rate pain in L shoulder no more than 2/10 due to overuse issues with functional tasks.   Status Achieved     OT SHORT TERM GOAL #7   Title Pt will demonstate ability to open jars with greater ease.   Status Partially Met  pt educated on available AE to  assist with opening jars           OT Long Term Goals - 09/14/17 0953      OT LONG TERM GOAL #1   Title Independent with updated HEP due 05/08/17)    Status Achieved     OT LONG TERM GOAL #2   Title Pt to improve RUE functional use as evidenced by performing 20 blocks or greater on Box & Blocks test   Baseline eval = 12 (Lt = 53)    Status Achieved  23 blocks     OT LONG TERM GOAL #3   Title Pt independent with splint wear and care prn for R fingers PRN (pt may not need splints)   Status Achieved     OT LONG TERM GOAL #4   Title Pt to use RUE as gross assist for all BADLS and bilateral tasks   Status Achieved     OT LONG TERM GOAL #5   Title Pt will be able to print her name with R hand with AE prn   Status Achieved     OT LONG  TERM GOAL #6   Title Pt will be able to eat at least 75% of her meals with her right hand.   Status Achieved     OT LONG TERM GOAL #7   Title Pt will demonstrate ability to use RUE for functional tasks with min compensations for reach patterns and hand patterns.    Status Partially Met  cannot fully assess as pt did not return for therapy               Plan - Oct 07, 2017 5035    Clinical Impression Statement Pt had been placed on hold as she was investigating possibility of botox injection however pt never returned therapy. WIll d/c from OT at this time   Rehab Potential Good   OT Frequency 2x / week   OT Duration 8 weeks   OT Treatment/Interventions Self-care/ADL training;DME and/or AE instruction;Splinting;Patient/family education;Moist Heat;Therapeutic exercises;Therapeutic activities;Cryotherapy;Neuromuscular education;Passive range of motion;Cognitive remediation/compensation;Electrical Stimulation;Manual Therapy   Plan see above      Patient will benefit from skilled therapeutic intervention in order to improve the following deficits and impairments:  Decreased coordination, Decreased range of motion, Impaired flexibility, Impaired  sensation, Impaired tone, Decreased knowledge of precautions, Decreased knowledge of use of DME, Impaired UE functional use, Pain, Decreased strength, Decreased cognition  Visit Diagnosis: 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 [facility])      G-Codes - 2017/10/07 0954    Functional Assessment Tool Used (Outpatient only) RUE: Box & Blocks = 12, 9 hole peg: unable   Functional Limitation Carrying, moving and handling objects   Carrying, Moving and Handling Objects Current Status (W6568) At least 20 percent but less than 40 percent impaired, limited or restricted   Carrying, Moving and Handling Objects Goal Status (L2751) At least 20 percent but less than 40 percent impaired, limited or restricted   Carrying, Moving and Handling Objects Discharge Status [id number]) At least 20 percent but less than 40 percent impaired, limited or restricted      Problem List Patient Active Problem List   Diagnosis Date Noted  . Trigger finger of left thumb 06/22/2017  . Pain in finger of left hand 06/22/2017  . Persistent dry cough 05/12/2017  . 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 Aphasia, late effect of cerebrovascular disease 03/14/2017  . Dysarthria as late effect of cerebrovascular accident (CVA) 03/14/2017  . Contracture of joint of finger of right hand 02/02/2017  . Right hand weakness 02/02/2017  . CVA (cerebral vascular accident) ([place]) 09/29/2015  . HTN (hypertension) 09/29/2015  . Diabetes mellitus type 2, uncontrolled, with complications ([place]) 49/44/9675  . Tinea pedis 09/29/2015  . Eczema 09/29/2015   OCCUPATIONAL THERAPY DISCHARGE SUMMARY  Visits from Start of Care: 14  Current functional level related to goals / functional outcomes: See above   Remaining deficits: Mild spasticity in L hand   Education / Equipment: HEP/splint Plan: Patient agrees to discharge.  Patient goals were met. Patient is being discharged due to meeting the stated  rehab goals.  ?????      Quay Burow , OTR/L October 07, 2017, 9:55 AM  [facility] [address] [place], [place], [address] Phone: [phone]   Fax:  [phone]  Name: [name] MRN: [id number] Date of Birth: 1966/10/07

## 2017-09-26 ENCOUNTER — Encounter: Payer: Self-pay | Admitting: Internal Medicine

## 2017-09-26 ENCOUNTER — Ambulatory Visit: Payer: Medicare Other | Attending: Internal Medicine | Admitting: Internal Medicine

## 2017-09-26 ENCOUNTER — Telehealth: Payer: Self-pay | Admitting: Internal Medicine

## 2017-09-26 VITALS — BP 169/95 | HR 85 | Temp 98.4°F | Resp 16 | Wt 187.6 lb

## 2017-09-26 DIAGNOSIS — Z1331 Encounter for screening for depression: Secondary | ICD-10-CM | POA: Diagnosis not present

## 2017-09-26 DIAGNOSIS — I10 Essential (primary) hypertension: Secondary | ICD-10-CM | POA: Diagnosis not present

## 2017-09-26 DIAGNOSIS — IMO0002 Reserved for concepts with insufficient information to code with codable children: Secondary | ICD-10-CM

## 2017-09-26 DIAGNOSIS — R531 Weakness: Secondary | ICD-10-CM | POA: Insufficient documentation

## 2017-09-26 DIAGNOSIS — D509 Iron deficiency anemia, unspecified: Secondary | ICD-10-CM

## 2017-09-26 DIAGNOSIS — Z79899 Other long term (current) drug therapy: Secondary | ICD-10-CM | POA: Insufficient documentation

## 2017-09-26 DIAGNOSIS — I6930 Unspecified sequelae of cerebral infarction: Secondary | ICD-10-CM

## 2017-09-26 DIAGNOSIS — E118 Type 2 diabetes mellitus with unspecified complications: Secondary | ICD-10-CM

## 2017-09-26 DIAGNOSIS — R29898 Other symptoms and signs involving the musculoskeletal system: Secondary | ICD-10-CM | POA: Diagnosis not present

## 2017-09-26 DIAGNOSIS — D649 Anemia, unspecified: Secondary | ICD-10-CM | POA: Diagnosis not present

## 2017-09-26 DIAGNOSIS — E1165 Type 2 diabetes mellitus with hyperglycemia: Secondary | ICD-10-CM | POA: Diagnosis not present

## 2017-09-26 DIAGNOSIS — Z7984 Long term (current) use of oral hypoglycemic drugs: Secondary | ICD-10-CM | POA: Diagnosis not present

## 2017-09-26 DIAGNOSIS — Z2821 Immunization not carried out because of patient refusal: Secondary | ICD-10-CM

## 2017-09-26 LAB — POCT GLYCOSYLATED HEMOGLOBIN (HGB A1C): Hemoglobin A1C: 11.5

## 2017-09-26 LAB — GLUCOSE, POCT (MANUAL RESULT ENTRY): POC GLUCOSE: 264 mg/dL — AB (ref 70–99)

## 2017-09-26 MED ORDER — FERROUS SULFATE 300 (60 FE) MG/5ML PO SYRP: 300.0000 mg | ORAL_SOLUTION | Freq: Two times a day (BID) | ORAL | 5 refills | Status: DC

## 2017-09-26 MED ORDER — LOVASTATIN 40 MG PO TABS: 40.0000 mg | ORAL_TABLET | Freq: Every day | ORAL | 6 refills | Status: DC

## 2017-09-26 MED ORDER — ASPIRIN EC 81 MG PO TBEC: 81.0000 mg | DELAYED_RELEASE_TABLET | Freq: Every day | ORAL | 1 refills | Status: DC

## 2017-09-26 MED FILL — LOVASTATIN 40 MG TABLET: 40 | 30 days supply | Qty: 30 | Fill #0

## 2017-09-26 NOTE — Telephone Encounter (Signed)
Pt.  Was seen by her PCP and has been referred to occupational Therapy . Pt. Does not want to go to the last specialist she saw. Please f/u

## 2017-09-26 NOTE — Patient Instructions (Signed)
Start taking iron twice a day as prescribed. Discontinue eating ice.  He should be taking the Janumet and Glucotrol twice a day. Continue healthy eating habits and regular exercise. Try to incorporate some weight training into your exercise.  If you have been referred for occupational therapy on your right hand.  Issue take an aspirin daily to help prevent recurrent stroke. Stop the atorvastatin. It has been changed to lovastatin instead.

## 2017-09-26 NOTE — Progress Notes (Signed)
Patient ID: [name], female    DOB: 07/12/1966  MRN: [id number]  CC: re-establish; Diabetes; and Hypertension   Subjective: [name] is a 51 y.o. female who presents for chronic disease management. Her concerns today include:  Patient with history of HTN, DM type II, HL, CVA with right-sided weakness, microcytic anemia. Last saw Dr. Adrian Blackwater 06/2017. Saw PA since then with LT sided HA and was referred to neurology.  1. DM: BS - meter not working x 2 wks. Prior BS were in the 140-170 before BF. Meds - taking Janumet and Glucotrol but only once a day Eating Habits:  Eating small meals. She is vegan.  Exercise: walking 3 x a wk for 1 hr. Discourage about wgh not dec Had eye exam 12/2016   2. HTN: compliant with Norvasc and salt restriction -did not take as yet. HA this a.m No CP/SOB/LE edema -checks BP but not regular  3. CVA: not taking ASA. States she was told to after she left hosp with CVA but "I take enough meds."  -persistent RT hand weakness and numbness, dec memory. Drop things when she tries to hold objects. Therapist had recommend botox inj to had as she has developed mild contractures. But will not restore sensation back to hand which she desires. Would like more therapy on hand -not taking Lipitor.  "it makes me feel woozy."  4. Anemia: not on iron. Admits to eating ice frequently -still get menses about every other mth. Heavy bleeding. Lasts 6-7 days.  No fhx of colon CA. No blood in stools  5. Pos Dep screen in past: pt denies any depression at this time  HM: declines flu, tdap and pneumovac  Patient Active Problem List   Diagnosis Date Noted  . Trigger finger of left thumb 06/22/2017  . Pain in finger of left hand 06/22/2017  . Persistent dry cough 05/12/2017  . 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 Aphasia, late effect of cerebrovascular disease 03/14/2017  . Dysarthria as late effect of cerebrovascular accident (CVA) 03/14/2017    . Contracture of joint of finger of right hand 02/02/2017  . Right hand weakness 02/02/2017  . CVA (cerebral vascular accident) ([place]) 09/29/2015  . HTN (hypertension) 09/29/2015  . Diabetes mellitus type 2, uncontrolled, with complications ([place]) 15/17/6160  . Tinea pedis 09/29/2015  . Eczema 09/29/2015     Current Outpatient Prescriptions on File Prior to Visit  Medication Sig Dispense Refill  . amLODipine (NORVASC) 10 MG tablet Take 1 tablet (10 mg total) by mouth daily. 30 tablet 5  . Blood Glucose Monitoring Suppl (TRUE METRIX METER) w/Device KIT 1 each by Does not apply route as needed. 1 kit 0  . butalbital-acetaminophen-caffeine (FIORICET, ESGIC) 50-325-40 MG tablet Take 1-2 tablets by mouth every 6 (six) hours as needed for headache. (Patient not taking: Reported on 08/03/2017) 20 tablet 0  . Elastic Bandages & Supports (FINGER SPLINT) MISC 1 each by Does not apply route daily. On Left thumb and ring finger (Patient not taking: Reported on 08/03/2017) 2 each 0  . glipiZIDE (GLUCOTROL) 10 MG tablet Take 1 tablet (10 mg total) by mouth 2 (two) times daily before a meal. 60 tablet 5  . glucose blood (TRUE METRIX BLOOD GLUCOSE TEST) test strip 1 each by Other route 2 (two) times daily. 100 each 11  . sitaGLIPtin-metformin (JANUMET) 50-1000 MG tablet Take 1 tablet by mouth 2 (two) times daily with a meal. 60 tablet 5  . TRUEPLUS  LANCETS 28G MISC 1 each by Does not apply route 2 (two) times daily. 100 each 11  . TURMERIC PO Take by mouth.     No current facility-administered medications on file prior to visit.     Allergies  Allergen Reactions  . Lipitor [Atorvastatin] Other (See Comments)    Made me feel woozy    Social History   Social History  . Marital status: Married    Spouse name: N/A  . Number of children: N/A  . Years of education: N/A   Occupational History  . Not on file.   Social History Main Topics  . Smoking status: Never Smoker  . Smokeless tobacco: Never  Used  . Alcohol use No  . Drug use: No  . Sexual activity: Not on file   Other Topics Concern  . 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 Diabetes Mother   . Hypertension Mother   . Hypertension Sister     Past Surgical History:  Procedure Laterality Date  . BREAST SURGERY    . CESAREAN SECTION    . TUBAL LIGATION  2009    ROS: Review of Systems Negative except as stated above  PHYSICAL EXAM: BP (!) 169/95   Pulse 85   Temp 98.4 F (36.9 C) (Oral)   Resp 16   Wt 187 lb 9.6 oz (85.1 kg)   SpO2 99%   BMI 37.89 kg/m   Wt Readings from Last 3 Encounters:  09/26/17 187 lb 9.6 oz (85.1 kg)  08/03/17 186 lb 3.2 oz (84.5 kg)  08/02/17 185 lb (83.9 kg)   Physical Exam  General appearance - alert, well appearing, middle-age African-American female and in no distress Mental status - alert, oriented to person, place, and time, normal mood, behavior, speech, dress, motor activity, and thought processes Eyes -extraocular movements intact. Very pale conjunctiva Mouth - mucous membranes moist, pharynx normal without lesions Neck - supple, no significant adenopathy Chest - clear to auscultation, no wheezes, rales or rhonchi, symmetric air entry Heart - normal rate, regular rhythm, normal S1, S2, no murmurs, rubs, clicks or gallops Neurological -mild flexion fingers of the right hand. Grip 5/5 left, 3-4/5 right. Decreased sensation to light touch dorsal surface of the right hand. Gait is normal. Speech normal Extremities - peripheral pulses normal, no pedal edema, no clubbing or cyanosis    Results for orders placed or performed in visit on 09/26/17  POCT glucose (manual entry)  Result Value Ref Range   POC Glucose 264 (A) 70 - 99 mg/dl  POCT glycosylated hemoglobin (Hb A1C)  Result Value Ref Range   Hemoglobin A1C 11.5    Lab Results  Component Value Date   HGBA1C  11.5 09/26/2017   Depression screen [facility] 2/9 08/03/2017 06/22/2017 05/12/2017 03/14/2017 03/02/2017  Decreased Interest 2 0 0 1 0  Down, Depressed, Hopeless 3 0 0 2 1  PHQ - 2 Score 5 0 0 3 1  Altered sleeping 3 0 0 1 1  Tired, decreased energy 3 0 '1 3 3  '$ Change in appetite 2 0 0 1 0  Feeling bad or failure about yourself  3 0 0 1 0  Trouble concentrating 0 0 0 1 0  Moving slowly or fidgety/restless 0 0 0 1 0  Suicidal thoughts 0 0 0 0 0  PHQ-9 Score 16 0 1 11  5  Difficult doing work/chores - - - Somewhat difficult -     Chemistry      Component Value Date/Time   NA 136 03/02/2017 0924   K 4.3 03/02/2017 0924   CL 102 03/02/2017 0924   CO2 26 03/02/2017 0924   BUN 7 03/02/2017 0924   CREATININE 0.62 03/02/2017 0924      Component Value Date/Time   CALCIUM 9.4 03/02/2017 0924   ALKPHOS 80 03/02/2017 0924   AST 22 03/02/2017 0924   ALT 12 03/02/2017 0924   BILITOT 0.4 03/02/2017 0924     Lab Results  Component Value Date   WBC 8.0 04/19/2016   HGB 8.8 (L) 04/19/2016   HCT 27.8 (L) 04/19/2016   MCV 64.4 (L) 04/19/2016   PLT 403 (H) 04/19/2016      ASSESSMENT AND PLAN: 1. Diabetes mellitus type 2, uncontrolled, with complications ([place]) Discussed the importance of healthy eating habits, regular aerobic exercise (at least 150 minutes a week as tolerated) and medication compliance to achieve or maintain control of diabetes. -Advised to take the Janumet and glipizide BID as prescribed,not once a day - POCT glucose (manual entry) - POCT glycosylated hemoglobin (Hb A1C) - Microalbumin/Creatinine Ratio, Urine  2. Essential hypertension Not at goal. She has not taken amlodipine as yet for the morning. Lisinopril a better choice given DM, however can address on next visit as pt resistant to taking meds to begin with so will minimize changes unless absolutely necessary  3. Microcytic anemia -Start iron supplement Advised to stop eating ice - ferrous sulfate 300 (60 Fe) MG/5ML  syrup; Take 5 mLs (300 mg total) by mouth 2 (two) times daily with a meal.  Dispense: 300 mL; Refill: 5 - CBC - Iron, TIBC and Ferritin Panel  4. History of CVA with residual deficit Discussed importance of secondary prevention including BP and DM control, use of aspirin and statin. We'll change Lipitor to lovastatin since she didn't tolerate the formal - aspirin EC 81 MG tablet; Take 1 tablet (81 mg total) by mouth daily.  Dispense: 100 tablet; Refill: 1 - lovastatin (MEVACOR) 40 MG tablet; Take 1 tablet (40 mg total) by mouth at bedtime.  Dispense: 30 tablet; Refill: 6 - Ambulatory referral to Occupational Therapy  5. Right hand weakness - Ambulatory referral to Occupational Therapy  6. Positive depression screening Patient states this is not an issue she is dealing with at this time   7. Influenza vaccination declined    Patient was given the opportunity to ask questions.  Patient verbalized understanding of the plan and was able to repeat key elements of the plan.   Orders Placed This Encounter  Procedures  . Microalbumin/Creatinine Ratio, Urine  . CBC  . Iron, TIBC and Ferritin Panel  . Ambulatory referral to Occupational Therapy  . POCT glucose (manual entry)  . POCT glycosylated hemoglobin (Hb A1C)     Requested Prescriptions   Signed Prescriptions Disp Refills  . aspirin EC 81 MG tablet 100 tablet 1    Sig: Take 1 tablet (81 mg total) by mouth daily.  Marland Kitchen lovastatin (MEVACOR) 40 MG tablet 30 tablet 6    Sig: Take 1 tablet (40 mg total) by mouth at bedtime.  . ferrous sulfate 300 (60 Fe) MG/5ML syrup 300 mL 5    Sig: Take 5 mLs (300 mg total) by mouth 2 (two) times daily with a meal.    Return in about 3 months (around 12/27/2017).  Karle Plumber, MD, FACP

## 2017-09-27 LAB — IRON,TIBC AND FERRITIN PANEL
Ferritin: 5 ng/mL — ABNORMAL LOW (ref 15–150)
IRON: 16 ug/dL — AB (ref 27–159)
Iron Saturation: 3 % — CL (ref 15–55)
Total Iron Binding Capacity: 460 ug/dL — ABNORMAL HIGH (ref 250–450)
UIBC: 444 ug/dL — ABNORMAL HIGH (ref 131–425)

## 2017-09-27 LAB — CBC
HEMATOCRIT: 28.3 % — AB (ref 34.0–46.6)
HEMOGLOBIN: 8.4 g/dL — AB (ref 11.1–15.9)
MCH: 18.6 pg — ABNORMAL LOW (ref 26.6–33.0)
MCHC: 29.7 g/dL — AB (ref 31.5–35.7)
MCV: 63 fL — AB (ref 79–97)
Platelets: 437 10*3/uL — ABNORMAL HIGH (ref 150–379)
RBC: 4.51 x10E6/uL (ref 3.77–5.28)
RDW: 19.4 % — AB (ref 12.3–15.4)
WBC: 7.4 10*3/uL (ref 3.4–10.8)

## 2017-09-27 LAB — MICROALBUMIN / CREATININE URINE RATIO
Creatinine, Urine: 85.8 mg/dL
Microalb/Creat Ratio: 30.3 mg/g creat — ABNORMAL HIGH (ref 0.0–30.0)
Microalbumin, Urine: 26 ug/mL

## 2017-10-17 ENCOUNTER — Ambulatory Visit (INDEPENDENT_AMBULATORY_CARE_PROVIDER_SITE_OTHER): Payer: Medicare Other | Admitting: Neurology

## 2017-10-17 ENCOUNTER — Encounter: Payer: Self-pay | Admitting: Neurology

## 2017-10-17 ENCOUNTER — Other Ambulatory Visit: Payer: Self-pay | Admitting: Neurology

## 2017-10-17 VITALS — BP 124/80 | HR 95 | Ht 59.0 in | Wt 189.0 lb

## 2017-10-17 DIAGNOSIS — I10 Essential (primary) hypertension: Secondary | ICD-10-CM | POA: Diagnosis not present

## 2017-10-17 DIAGNOSIS — E1165 Type 2 diabetes mellitus with hyperglycemia: Secondary | ICD-10-CM

## 2017-10-17 DIAGNOSIS — I6300 Cerebral infarction due to thrombosis of unspecified precerebral artery: Secondary | ICD-10-CM

## 2017-10-17 DIAGNOSIS — G44229 Chronic tension-type headache, not intractable: Secondary | ICD-10-CM

## 2017-10-17 DIAGNOSIS — E785 Hyperlipidemia, unspecified: Secondary | ICD-10-CM

## 2017-10-17 DIAGNOSIS — I6523 Occlusion and stenosis of bilateral carotid arteries: Secondary | ICD-10-CM

## 2017-10-17 DIAGNOSIS — G8321 Monoplegia of upper limb affecting right dominant side: Secondary | ICD-10-CM

## 2017-10-17 DIAGNOSIS — G4719 Other hypersomnia: Secondary | ICD-10-CM | POA: Diagnosis not present

## 2017-10-17 NOTE — Patient Instructions (Signed)
1.  We will refer you for a sleep study to assess for sleep apnea (which can cause fatigue and headache) 2.  We will refer you for Botox of your right hand 3.  Continue aspirin daily 4.  Optimize cholesterol and sugar control 5.  Check carotid doppler 6.  Follow up in 3 months.

## 2017-10-17 NOTE — Progress Notes (Signed)
NEUROLOGY CONSULTATION NOTE  MARILLA BODDY MRN: [id number] DOB: 1966/10/11  Referring provider: Zettie Pho, PA-C Primary care provider: Karle Plumber, MD  Reason for consult:  headache  HISTORY OF PRESENT ILLNESS: [name] is a 51 year old female with diabetes, hyperlipidemia, hypertension and residual right sided weakness as late effect of stroke who presents for headache.  History supplemented by ED and PCP notes.  Onset:  "all of my life" Location:  Left suboccipital region and bifrontal Quality:  nonthrobbing Intensity:  moderate Aura:  no Prodrome:  no Postdrome:  no Associated symptoms:  Photophobia, some phonophobia.  No nausea, vomiting or visual disturbance..  She has not had any new worse headache of her life, waking up from sleep.  She does wake up with headache. Duration:  All day Frequency:  Varies.  May go 2 to 4 weeks with no headache and then have several weeks to months with 3 days of headache weekly. Frequency of abortive medication: no Triggers/exacerbating factors:  no Relieving factors:  Walking, rubbing nutmeg on temples Activity:  Able to function  Past NSAIDS:  Ibuprofen, Aleve Past analgesics:  Excedrin, Tylenol, Fioricet Past abortive triptans:  no Past muscle relaxants:  no Past anti-emetic:  no Past antihypertensive medications:  no Past antidepressant medications:  no Past anticonvulsant medications:  no Past vitamins/Herbal/Supplements:  turmeric Other past therapies:  no  Current NSAIDS:  ASA '81mg'$  (secondary stroke prevention) Current analgesics:  no Current triptans:  no Current anti-emetic:  no Current muscle relaxants:  no Current anti-anxiolytic:  no Current sleep aide:  no Current Antihypertensive medications:  amlodipine Current Antidepressant medications:  no Current Anticonvulsant medications:  no Current Vitamins/Herbal/Supplements:  no Current Antihistamines/Decongestants:  no Other therapy:  no  Caffeine:   Only to treat headache Alcohol:  Wine rarely Smoker:  no Diet:  Hydrates.  No processed foods.  Vegan Exercise:  Walks daily Depression/anxiety:  no Sleep hygiene:  Good.  However, she does feel fatigued during the day Family history of headache:  no  She had a left MCA ischemic stroke in September 2015. She takes ASA '81mg'$  daily.  She is on a statin. MRI of brain from 04/19/16 personally reviewed and revealed remote left MCA territory infarct and empty sella. 09/26/17 Hgb A1c 11.5 03/02/17 LDL 138. CMP from 03/02/17:  Na 136, K 4.3, Cl 102, CO2 26, glucose 189, BUN 7, Cr 0.62, t bili 0.4, ALP 80, AST 22 and ALT 12.  PAST MEDICAL HISTORY: Past Medical History:  Diagnosis Date  . Diabetes mellitus without complication ([place]) 9702   previously on [place]   . Gestational diabetes 2009   . Hypertension 09/07/2014  . Stroke ([place]) 09/07/2014   R side. weakness in upper arm. No deficit in leg. deficit in speech. A littel bit in coordination.     PAST SURGICAL HISTORY: Past Surgical History:  Procedure Laterality Date  . BREAST SURGERY    . CESAREAN SECTION    . TUBAL LIGATION  2009    MEDICATIONS: Current Outpatient Prescriptions on File Prior to Visit  Medication Sig Dispense Refill  . amLODipine (NORVASC) 10 MG tablet Take 1 tablet (10 mg total) by mouth daily. 30 tablet 5  . aspirin EC 81 MG tablet Take 1 tablet (81 mg total) by mouth daily. 100 tablet 1  . Blood Glucose Monitoring Suppl (TRUE METRIX METER) w/Device KIT 1 each by Does not apply route as needed. 1 kit 0  . butalbital-acetaminophen-caffeine (FIORICET, ESGIC) 50-325-40 MG tablet  Take 1-2 tablets by mouth every 6 (six) hours as needed for headache. (Patient not taking: Reported on 08/03/2017) 20 tablet 0  . Elastic Bandages & Supports (FINGER SPLINT) MISC 1 each by Does not apply route daily. On Left thumb and ring finger (Patient not taking: Reported on 08/03/2017) 2 each 0  . ferrous sulfate 300 (60 Fe) MG/5ML syrup Take 5  mLs (300 mg total) by mouth 2 (two) times daily with a meal. 300 mL 5  . glipiZIDE (GLUCOTROL) 10 MG tablet Take 1 tablet (10 mg total) by mouth 2 (two) times daily before a meal. (Patient not taking: Reported on 10/17/2017) 60 tablet 5  . glucose blood (TRUE METRIX BLOOD GLUCOSE TEST) test strip 1 each by Other route 2 (two) times daily. 100 each 11  . lovastatin (MEVACOR) 40 MG tablet Take 1 tablet (40 mg total) by mouth at bedtime. 30 tablet 6  . sitaGLIPtin-metformin (JANUMET) 50-1000 MG tablet Take 1 tablet by mouth 2 (two) times daily with a meal. 60 tablet 5  . TRUEPLUS LANCETS 28G MISC 1 each by Does not apply route 2 (two) times daily. 100 each 11  . TURMERIC PO Take by mouth.     No current facility-administered medications on file prior to visit.     ALLERGIES: Allergies  Allergen Reactions  . Lipitor [Atorvastatin] Other (See Comments)    Made me feel woozy    FAMILY HISTORY: Family History  Problem Relation Age of Onset  . Diabetes Mother   . Hypertension Mother   . Epilepsy Father   . Hypertension Sister     SOCIAL HISTORY: Social History   Social History  . Marital status: Married    Spouse name: N/A  . Number of children: 5  . Years of education: associates   Occupational History  . disabled    Social History Main Topics  . Smoking status: Never Smoker  . Smokeless tobacco: Never Used  . Alcohol use No  . Drug use: No  . Sexual activity: Not on file   Other Topics Concern  . 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    REVIEW OF SYSTEMS: Constitutional: No fevers, chills, or sweats, no generalized fatigue, change in appetite Eyes: No visual changes, double vision, eye pain Ear, nose and throat: No hearing loss, ear pain, nasal congestion, sore throat Cardiovascular: No chest pain, palpitations Respiratory:  No shortness of breath at rest or with exertion,  wheezes GastrointestinaI: No nausea, vomiting, diarrhea, abdominal pain, fecal incontinence Genitourinary:  No dysuria, urinary retention or frequency Musculoskeletal:  No neck pain, back pain Integumentary: No rash, pruritus, skin lesions Neurological: as above Psychiatric: No depression, insomnia, anxiety Endocrine: No palpitations, fatigue, diaphoresis, mood swings, change in appetite, change in weight, increased thirst Hematologic/Lymphatic:  No purpura, petechiae. Allergic/Immunologic: no itchy/runny eyes, nasal congestion, recent allergic reactions, rashes  PHYSICAL EXAM: Vitals:   10/17/17 0755  BP: 124/80  Pulse: 95  SpO2: 99%   General: No acute distress.  Patient appears well-groomed.  Head:  Normocephalic/atraumatic Eyes:  fundi examined but not visualized Neck: supple, no paraspinal tenderness, full range of motion Back: No paraspinal tenderness Heart: regular rate and rhythm Lungs: Clear to auscultation bilaterally. Vascular: No carotid bruits. Neurological Exam: Mental status: alert and oriented to person, place, and time, recent and remote memory intact, fund of knowledge intact, attention and concentration intact, speech fluent  and not dysarthric, language intact. Cranial nerves: CN I: not tested CN II: pupils equal, round and reactive to light, visual fields intact CN III, IV, VI:  full range of motion, no nystagmus, no ptosis CN V: facial sensation intact CN VII: upper and lower face symmetric CN VIII: hearing intact CN IX, X: gag intact, uvula midline CN XI: sternocleidomastoid and trapezius muscles intact CN XII: tongue midline Bulk & Tone: spasticity of right hand, no fasciculations. Motor:  4+/5 right hand grip/intrinsic hand muscles.  Otherwise, 5/5 throughout  Sensation: temperature and vibration sensation intact. Deep Tendon Reflexes:  3+ right upper extremity, otherwise 2+ throughout, toes downgoing.  Finger to nose testing:  Without dysmetria.    Heel to shin:  Without dysmetria.  Gait:  Normal station and stride.  Able to turn and tandem walk. Romberg negative.  IMPRESSION: Chronic tension type headache Excessive daytime somnolence Right spastic monoparesis as late effect of stroke Uncontrolled type 2 diabetes Hyperlipidemia HTN  PLAN: 1.  She would rather not start a medication for headache at this time.  She does have daytime fatigue and wakes up with headache.  We will order sleep study to assess for sleep apnea.  If she does not have sleep apnea, I would try nortriptyline. 2.  Refer to PM&R for Botox for right upper extremity 3.  Check carotid doppler 4.  Continue ASA '81mg'$  daily for secondary stroke prevention 5.  Continue blood pressure control management as per PCP 6.  Optimize glycemic control and statin therapy (LDL goal less than 70) as per PCP. 7.  Follow up in 3 months.  Thank you for allowing me to take part in the care of this patient.  Metta Clines, DO  CC:  Karle Plumber, MD  Zettie Pho, PA-C

## 2017-10-26 ENCOUNTER — Ambulatory Visit (HOSPITAL_COMMUNITY)
Admission: RE | Admit: 2017-10-26 | Discharge: 2017-10-26 | Disposition: A | Discharge status: Home / Self Care | Payer: Medicare Other | Source: Ambulatory Visit | Attending: Cardiology | Admitting: Cardiology

## 2017-10-26 DIAGNOSIS — I6523 Occlusion and stenosis of bilateral carotid arteries: Secondary | ICD-10-CM | POA: Diagnosis not present

## 2017-10-26 NOTE — Telephone Encounter (Signed)
10/19/17 [facility] was calling her to schedule an appointment

## 2017-10-27 ENCOUNTER — Telehealth: Payer: Self-pay

## 2017-10-27 NOTE — Telephone Encounter (Signed)
Called and spoke with Pt, advsd doppler was normal

## 2017-10-27 NOTE — Telephone Encounter (Signed)
-----   Message from Drema DallasAdam R Jaffe, DO sent at 10/27/2017  7:21 AM EST ----- Carotid doppler looks okay.

## 2017-11-15 ENCOUNTER — Telehealth: Payer: Self-pay | Admitting: Internal Medicine

## 2017-11-15 MED FILL — AMLODIPINE BESYLATE 10 MG T: 10 | 30 days supply | Qty: 30 | Fill #1

## 2017-11-15 NOTE — Telephone Encounter (Signed)
Pt. Came to facility to drop off [employer] paperwork to be filled out by PCP. Pt. Was told that paperwork would take 7/14 business days to be filled out. Please f/u

## 2017-11-22 ENCOUNTER — Telehealth: Payer: Self-pay | Admitting: Internal Medicine

## 2017-11-22 NOTE — Telephone Encounter (Signed)
Pt came in to request that her papers be signed and if possible completed before 11/29/2017 to be eligible for the student loan

## 2017-11-28 ENCOUNTER — Ambulatory Visit: Payer: Medicare Other | Admitting: Physical Medicine & Rehabilitation

## 2017-11-28 ENCOUNTER — Ambulatory Visit: Payer: Medicare Other

## 2017-12-14 ENCOUNTER — Telehealth: Payer: Self-pay | Admitting: *Deleted

## 2017-12-14 MED ORDER — DIAZEPAM 5 MG PO TABS
ORAL_TABLET | ORAL | 0 refills | Status: DC
Start: 2017-12-14 — End: 2018-02-19

## 2017-12-14 NOTE — Telephone Encounter (Addendum)
Per Dr Wynn BankerKirsteins: "Call in valium 5mg  #1 No RF take 1 hr prior to procedure." Diazepam 5 mg po called to the pharmacy to be taken one hour prior to procedure on 12/22/17.  Ms Poulson notified and verified pharmacy to be used.

## 2017-12-22 ENCOUNTER — Ambulatory Visit: Payer: Medicare Other | Admitting: Physical Medicine & Rehabilitation

## 2017-12-27 ENCOUNTER — Telehealth: Payer: Self-pay

## 2017-12-27 NOTE — Telephone Encounter (Signed)
Patient called requesting information about her upcoming botox injection appointment.

## 2017-12-28 ENCOUNTER — Ambulatory Visit: Payer: Medicare Other | Admitting: Internal Medicine

## 2017-12-29 ENCOUNTER — Encounter: Payer: Self-pay | Admitting: Internal Medicine

## 2017-12-29 ENCOUNTER — Ambulatory Visit: Payer: PPO | Attending: Internal Medicine | Admitting: Licensed Clinical Social Worker

## 2017-12-29 ENCOUNTER — Ambulatory Visit: Payer: PPO | Attending: Internal Medicine | Admitting: Internal Medicine

## 2017-12-29 VITALS — BP 130/84 | HR 95 | Temp 98.6°F | Resp 16 | Wt 188.0 lb

## 2017-12-29 DIAGNOSIS — IMO0002 Reserved for concepts with insufficient information to code with codable children: Secondary | ICD-10-CM

## 2017-12-29 DIAGNOSIS — Z8249 Family history of ischemic heart disease and other diseases of the circulatory system: Secondary | ICD-10-CM | POA: Insufficient documentation

## 2017-12-29 DIAGNOSIS — E1165 Type 2 diabetes mellitus with hyperglycemia: Secondary | ICD-10-CM | POA: Diagnosis not present

## 2017-12-29 DIAGNOSIS — R51 Headache: Secondary | ICD-10-CM | POA: Insufficient documentation

## 2017-12-29 DIAGNOSIS — I10 Essential (primary) hypertension: Secondary | ICD-10-CM | POA: Diagnosis not present

## 2017-12-29 DIAGNOSIS — Z7982 Long term (current) use of aspirin: Secondary | ICD-10-CM | POA: Insufficient documentation

## 2017-12-29 DIAGNOSIS — F419 Anxiety disorder, unspecified: Secondary | ICD-10-CM

## 2017-12-29 DIAGNOSIS — D509 Iron deficiency anemia, unspecified: Secondary | ICD-10-CM

## 2017-12-29 DIAGNOSIS — I639 Cerebral infarction, unspecified: Secondary | ICD-10-CM | POA: Insufficient documentation

## 2017-12-29 DIAGNOSIS — Z91199 Patient's noncompliance with other medical treatment and regimen due to unspecified reason: Secondary | ICD-10-CM | POA: Insufficient documentation

## 2017-12-29 DIAGNOSIS — E118 Type 2 diabetes mellitus with unspecified complications: Secondary | ICD-10-CM | POA: Diagnosis not present

## 2017-12-29 DIAGNOSIS — Z9119 Patient's noncompliance with other medical treatment and regimen: Secondary | ICD-10-CM | POA: Insufficient documentation

## 2017-12-29 DIAGNOSIS — Z79899 Other long term (current) drug therapy: Secondary | ICD-10-CM | POA: Insufficient documentation

## 2017-12-29 DIAGNOSIS — R471 Dysarthria and anarthria: Secondary | ICD-10-CM | POA: Diagnosis not present

## 2017-12-29 DIAGNOSIS — Z833 Family history of diabetes mellitus: Secondary | ICD-10-CM | POA: Insufficient documentation

## 2017-12-29 DIAGNOSIS — Z9889 Other specified postprocedural states: Secondary | ICD-10-CM | POA: Diagnosis not present

## 2017-12-29 DIAGNOSIS — Z9114 Patient's other noncompliance with medication regimen: Secondary | ICD-10-CM | POA: Diagnosis not present

## 2017-12-29 DIAGNOSIS — R531 Weakness: Secondary | ICD-10-CM | POA: Insufficient documentation

## 2017-12-29 DIAGNOSIS — Z7984 Long term (current) use of oral hypoglycemic drugs: Secondary | ICD-10-CM | POA: Insufficient documentation

## 2017-12-29 DIAGNOSIS — Z888 Allergy status to other drugs, medicaments and biological substances status: Secondary | ICD-10-CM | POA: Diagnosis not present

## 2017-12-29 LAB — GLUCOSE, POCT (MANUAL RESULT ENTRY): POC GLUCOSE: 124 mg/dL — AB (ref 70–99)

## 2017-12-29 LAB — POCT GLYCOSYLATED HEMOGLOBIN (HGB A1C): Hemoglobin A1C: 12.2

## 2017-12-29 MED ORDER — FERROUS SULFATE 300 (60 FE) MG/5ML PO SYRP: 300.0000 mg | ORAL_SOLUTION | Freq: Two times a day (BID) | ORAL | 5 refills | Status: DC

## 2017-12-29 NOTE — BH Specialist Note (Signed)
Integrated Behavioral Health Initial Visit  MRN: [id number] Name: [name]  Number of Integrated Behavioral Health Clinician visits:: 1/6 Session Start time: 12:00 PM  Session End time: 12:20 PM Total time: 20 minutes  Type of Service: Integrated Behavioral Health- Individual/Family Interpretor:No. Interpretor Name and Language: N/A   Warm Hand Off Completed.       SUBJECTIVE: [name] is a 52 y.o. female accompanied by self Patient was referred by Dr. Laural BenesJohnson for medicine compliance. Patient reports the following symptoms/concerns: feelings of worry, difficulty relaxing, and irritability Duration of problem: Ongoing; Severity of problem: mild  OBJECTIVE: Mood: Anxious and Affect: Appropriate Risk of harm to self or others: No plan to harm self or others  LIFE CONTEXT: Family and Social: Pt receives support from family and friends. Spouse travels a lot for employer resulting in pt having to primarily care for children School/Work: Pt is currently a student Self-Care: Pt reports improving her diet and walking 3-4 x a week Life Changes: Pt is experiencing conflicting feelings about taking medication. She is primarily responsible for minor children and receives limited support from spouse   GOALS ADDRESSED: Patient will: 1. Reduce symptoms of: anxiety 2. Increase knowledge and/or ability of: coping skills and self-management skills  3. Demonstrate ability to: Increase adequate support systems for patient/family and Improve medication compliance  INTERVENTIONS: Interventions utilized: Mindfulness or Management consultantelaxation Training, Behavioral Activation and Psychoeducation and/or Health Education  Standardized Assessments completed: GAD-7 and PHQ 2&9  ASSESSMENT: Patient currently experiencing anxiety triggered by difficulty managing ongoing medical conditions while balancing other responsibilities (children, school, etc) Pt reports feelings of worry, difficulty relaxing, and  irritability. She receives limited support.   Patient may benefit from psychoeducation. LCSWA educated pt on correlation between one's physical and mental health, in addition, to how stress can negatively impact health. LCSWA discussed importance of medicine compliance. Pt identified sources of motivation and agreed to implement SMART goals. LCSWA provided pt with various techniques to assist in medication compliance.    PLAN: 1. Follow up with behavioral health clinician on : Pt was encouraged to contact LCSWA if symptoms worsen or fail to improve [facility] 2. Behavioral recommendations: LCSWA recommends that pt apply healthy coping skills discussed. Pt is encouraged to schedule follow up appointment with LCSWA 3. Referral(s): na 4. "From scale of 1-10, how likely are you to follow plan?": 10/10  Bridgett LarssonJasmine D Khalessi Blough, LCSW 01/02/17 4:54 PM

## 2017-12-29 NOTE — Patient Instructions (Signed)
Please take your medications every day as prescribed.    Take the iron prescription to an outside pharmacy.  Take as prescribed.

## 2017-12-29 NOTE — Progress Notes (Signed)
Patient ID: [name], female    DOB: [birth year]  MRN: [id number]  CC: Diabetes   Subjective: [name] is a 52 y.o. female who presents for chronic ds management Her concerns today include:  Patient with history of HTN, DM type II, HL, CVA with right-sided weakness, microcytic anemia. Pos dep screen  1.  DM: On last visit blood sugars were uncontrolled partially due to medication noncompliance.  Patient reportedly was taking Janumet and glipizide once a day instead of twice daily.  The goal was to take twice daily as prescribed.  -Since that visit she states that she takes the medication twice a day but every other day.  States that she hates taking medications - checking BS Q a.m range 130-150 Execise: walking 3 miles 3-4 x a wk Diet:" I eat plenty of greens." Not much starch.  She is vegan  2.  HTN: not taking Norvasc every day.  "I need to get it in my mind to take my blood pressure medication."  3.  Anemia: She has a moderate to severe anemia with iron studies consistent with this. Prescribed liquid iron on last visit but she did not get it because our pharmacy does not carry the medication. -She states she has been drinking Geritol She denies any dizziness at this time.  Endorses tiredness  4 CVA/HA:  saw her neurologist Dr. Jodean Lima 09/2017 for headaches. Diagnosed with chronic tension type headaches. -Recommended sleep study and referred her to PMR for Botox injection to the right hand -Also had carotid ultrasounds that revealed no high-grade stenosis.  Patient Active Problem List   Diagnosis Date Noted  . Microcytic anemia 09/26/2017  . 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 Aphasia, late effect of cerebrovascular disease 03/14/2017  . Dysarthria as late effect of cerebrovascular accident (CVA) 03/14/2017  . Contracture of joint of finger of right hand 02/02/2017  . Right hand weakness 02/02/2017  . CVA (cerebral vascular accident) ([place])  09/29/2015  . HTN (hypertension) 09/29/2015  . Diabetes mellitus type 2, uncontrolled, with complications ([place]) 87/86/7672  . Eczema 09/29/2015     Current Outpatient Medications on File Prior to Visit  Medication Sig Dispense Refill  . amLODipine (NORVASC) 10 MG tablet Take 1 tablet (10 mg total) by mouth daily. 30 tablet 5  . aspirin EC 81 MG tablet Take 1 tablet (81 mg total) by mouth daily. 100 tablet 1  . Blood Glucose Monitoring Suppl (TRUE METRIX METER) w/Device KIT 1 each by Does not apply route as needed. 1 kit 0  . butalbital-acetaminophen-caffeine (FIORICET, ESGIC) 50-325-40 MG tablet Take 1-2 tablets by mouth every 6 (six) hours as needed for headache. (Patient not taking: Reported on 08/03/2017) 20 tablet 0  . diazepam (VALIUM) 5 MG tablet Take one tablet one hour prior to procedure (Patient not taking: Reported on 12/29/2017) 1 tablet 0  . Elastic Bandages & Supports (FINGER SPLINT) MISC 1 each by Does not apply route daily. On Left thumb and ring finger (Patient not taking: Reported on 08/03/2017) 2 each 0  . glipiZIDE (GLUCOTROL) 10 MG tablet Take 1 tablet (10 mg total) by mouth 2 (two) times daily before a meal. (Patient not taking: Reported on 10/17/2017) 60 tablet 5  . glucose blood (TRUE METRIX BLOOD GLUCOSE TEST) test strip 1 each by Other route 2 (two) times daily. 100 each 11  . lovastatin (MEVACOR) 40 MG tablet Take 1 tablet (40 mg total) by mouth at bedtime. 30 tablet 6  .  sitaGLIPtin-metformin (JANUMET) 50-1000 MG tablet Take 1 tablet by mouth 2 (two) times daily with a meal. 60 tablet 5  . TRUEPLUS LANCETS 28G MISC 1 each by Does not apply route 2 (two) times daily. 100 each 11  . TURMERIC PO Take by mouth.     No current facility-administered medications on file prior to visit.     Allergies  Allergen Reactions  . Lipitor [Atorvastatin] Other (See Comments)    Made me feel woozy    Social History   Socioeconomic History  . Marital status: Married    Spouse  name: Not on file  . Number of children: 5  . Years of education: associates  . Highest education level: Not on file  Social Needs  . Financial resource strain: Not on file  . Food insecurity - worry: Not on file  . Food insecurity - inability: Not on file  . Transportation needs - medical: Not on file  . Transportation needs - non-medical: Not on file  Occupational History  . Occupation: disabled  Tobacco Use  . Smoking status: Never Smoker  . Smokeless tobacco: Never Used  Substance and Sexual Activity  . Alcohol use: No  . Drug use: No  . Sexual activity: Not on file  Other Topics Concern  . 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    Family History  Problem Relation Age of Onset  . Diabetes Mother   . Hypertension Mother   . Epilepsy Father   . Hypertension Sister     Past Surgical History:  Procedure Laterality Date  . BREAST SURGERY    . CESAREAN SECTION    . TUBAL LIGATION  2009    ROS: Review of Systems Negative except as above   PHYSICAL EXAM: BP 130/84   Pulse 95   Temp 98.6 F (37 C) (Oral)   Resp 16   Wt 188 lb (85.3 kg)   SpO2 99%   BMI 37.97 kg/m   Physical Exam  General appearance - alert, well appearing, and in no distress Mental status - alert, oriented to person, place, and time, normal mood, behavior, speech, dress, motor activity, and thought processes Neck - supple, no significant adenopathy Chest - clear to auscultation, no wheezes, rales or rhonchi, symmetric air entry Heart - normal rate, regular rhythm, normal S1, S2, no murmurs, rubs, clicks or gallops Depression screen [facility] 2/9 12/29/2017 08/03/2017 06/22/2017  Decreased Interest 0 2 0  Down, Depressed, Hopeless 0 3 0  PHQ - 2 Score 0 5 0  Altered sleeping - 3 0  Tired, decreased energy - 3 0  Change in appetite - 2 0  Feeling bad or failure about yourself  - 3 0  Trouble concentrating - 0 0   Moving slowly or fidgety/restless - 0 0  Suicidal thoughts - 0 0  PHQ-9 Score - 16 0  Difficult doing work/chores - - -     Results for orders placed or performed in visit on 12/29/17  POCT glucose (manual entry)  Result Value Ref Range   POC Glucose 124 (A) 70 - 99 mg/dl  POCT glycosylated hemoglobin (Hb A1C)  Result Value Ref Range   Hemoglobin A1C 12.2     ASSESSMENT AND PLAN: 1. Diabetes mellitus type 2, uncontrolled, with complications ([place]) -Again discussed the importance of trying to get her blood sugars under better control to prevent further complications  of diabetes.  Patient became tearful.  She states she will take the Janumet and glipizide twice a day as prescribed. -I have asked our LCSW to see her today for further motivational interviewing to try to get to the root of her noncompliance or denial of her condition. Continue regular exercise and healthy eating habits. - POCT glucose (manual entry) - POCT glycosylated hemoglobin (Hb A1C)  2. Microcytic anemia Advised patient that Darnelle Maffucci will not get her iron levels up to where they need to be.  I recommend taking the prescription for the liquid iron to any outside pharmacy.  Prescription printed and given to patient. - ferrous sulfate 300 (60 Fe) MG/5ML syrup; Take 5 mLs (300 mg total) by mouth 2 (two) times daily with a meal.  Dispense: 300 mL; Refill: 5  3. Essential hypertension At goal.  Continue Norvasc for now  4. Medically noncompliant LCSW to meet with patient today.  Patient brought in a form for handicap sticker.  This was completed during this visit and returned to patient.  Patient was given the opportunity to ask questions.  Patient verbalized understanding of the plan and was able to repeat key elements of the plan.   Orders Placed This Encounter  Procedures  . POCT glucose (manual entry)  . POCT glycosylated hemoglobin (Hb A1C)     Requested Prescriptions   Signed Prescriptions Disp Refills   . ferrous sulfate 300 (60 Fe) MG/5ML syrup 300 mL 5    Sig: Take 5 mLs (300 mg total) by mouth 2 (two) times daily with a meal.    Return in about 2 months (around 02/26/2018).  Karle Plumber, MD, FACP

## 2017-12-29 NOTE — Progress Notes (Signed)
Pt states she has triggered finger on her ring finger on her left hand for 6 months  Pt states she went to the [facility] and nobody doesn't know what is wrong

## 2018-01-01 ENCOUNTER — Telehealth: Payer: Self-pay | Admitting: Physical Medicine & Rehabilitation

## 2018-01-01 NOTE — Telephone Encounter (Signed)
Pt has botox & wants a call back before coming to appt on 1/21

## 2018-01-01 NOTE — Telephone Encounter (Signed)
Patient concerned if Botox injections are painful.  I reassured patient that one persons ability to deal with pain from injections is unique. I told her that we try to make people as comfortable as possible and that in the end the outcome is worth effort

## 2018-01-05 ENCOUNTER — Ambulatory Visit: Payer: Medicare Other | Admitting: Internal Medicine

## 2018-01-08 ENCOUNTER — Encounter: Payer: Self-pay | Admitting: Physical Medicine & Rehabilitation

## 2018-01-08 ENCOUNTER — Ambulatory Visit: Payer: PPO | Admitting: Physical Medicine & Rehabilitation

## 2018-01-08 ENCOUNTER — Ambulatory Visit: Payer: Medicare Other | Admitting: Physical Medicine & Rehabilitation

## 2018-01-08 ENCOUNTER — Encounter: Payer: PPO | Attending: Physical Medicine & Rehabilitation

## 2018-01-08 ENCOUNTER — Other Ambulatory Visit: Payer: Self-pay

## 2018-01-08 VITALS — BP 138/83 | HR 95

## 2018-01-08 DIAGNOSIS — G8320 Monoplegia of upper limb affecting unspecified side: Secondary | ICD-10-CM | POA: Diagnosis not present

## 2018-01-08 NOTE — Patient Instructions (Signed)

## 2018-01-08 NOTE — Progress Notes (Signed)
Botox Injection for spasticity using needle EMG guidance  Dilution: 50 Units/ml Indication: Severe spasticity which interferes with ADL,mobility and/or  hygiene and is unresponsive to medication management and other conservative care Informed consent was obtained after describing risks and benefits of the procedure with the patient. This includes bleeding, bruising, infection, excessive weakness, or medication side effects. A REMS form is on file and signed. Needle: 27g 1" needle electrode Number of units per muscle   FDS75- used EMG and E stim to ID fascicles to digits 2 and 3 (50U) vs digits 4 and 5 (25U)  [id number]  All injections were done after obtaining appropriate EMG activity and after negative drawback for blood. The patient tolerated the procedure well. Post procedure instructions were given. A followup appointment was made.

## 2018-01-12 DIAGNOSIS — E119 Type 2 diabetes mellitus without complications: Secondary | ICD-10-CM | POA: Diagnosis not present

## 2018-01-24 DIAGNOSIS — E113213 Type 2 diabetes mellitus with mild nonproliferative diabetic retinopathy with macular edema, bilateral: Secondary | ICD-10-CM | POA: Diagnosis not present

## 2018-01-24 DIAGNOSIS — H2513 Age-related nuclear cataract, bilateral: Secondary | ICD-10-CM | POA: Diagnosis not present

## 2018-01-24 DIAGNOSIS — H47333 Pseudopapilledema of optic disc, bilateral: Secondary | ICD-10-CM | POA: Diagnosis not present

## 2018-01-24 DIAGNOSIS — H3582 Retinal ischemia: Secondary | ICD-10-CM | POA: Diagnosis not present

## 2018-01-29 DIAGNOSIS — H35033 Hypertensive retinopathy, bilateral: Secondary | ICD-10-CM | POA: Diagnosis not present

## 2018-01-29 DIAGNOSIS — E113313 Type 2 diabetes mellitus with moderate nonproliferative diabetic retinopathy with macular edema, bilateral: Secondary | ICD-10-CM | POA: Diagnosis not present

## 2018-01-29 DIAGNOSIS — H3582 Retinal ischemia: Secondary | ICD-10-CM | POA: Diagnosis not present

## 2018-01-29 DIAGNOSIS — H3581 Retinal edema: Secondary | ICD-10-CM | POA: Diagnosis not present

## 2018-01-30 ENCOUNTER — Ambulatory Visit (INDEPENDENT_AMBULATORY_CARE_PROVIDER_SITE_OTHER): Payer: PPO | Admitting: Neurology

## 2018-01-30 ENCOUNTER — Encounter: Payer: Self-pay | Admitting: Neurology

## 2018-01-30 ENCOUNTER — Other Ambulatory Visit: Payer: Self-pay | Admitting: *Deleted

## 2018-01-30 ENCOUNTER — Ambulatory Visit: Payer: Medicare Other | Admitting: Neurology

## 2018-01-30 VITALS — BP 120/80 | HR 104 | Ht 59.0 in | Wt 180.4 lb

## 2018-01-30 DIAGNOSIS — G4719 Other hypersomnia: Secondary | ICD-10-CM

## 2018-01-30 DIAGNOSIS — G8321 Monoplegia of upper limb affecting right dominant side: Secondary | ICD-10-CM

## 2018-01-30 DIAGNOSIS — G44229 Chronic tension-type headache, not intractable: Secondary | ICD-10-CM

## 2018-01-30 NOTE — Patient Instructions (Addendum)
1.  We will again refer you for sleep study.  If you don't have sleep apnea, I would then like to start a daily medication for headache prevention 2.  Follow up in 3 months.

## 2018-01-30 NOTE — Progress Notes (Signed)
NEUROLOGY FOLLOW UP OFFICE NOTE  [name] [id number]  HISTORY OF PRESENT ILLNESS: [name] is a 52 year old female with diabetes, hyperlipidemia, hypertension and residual right sided weakness as late effect of stroke who follows up for chronic tension-type headache.  UPDATE: She never received a call about scheduling a sleep study. Intensity:  Moderate to severe Duration:  All day Frequency:  daily Current NSAIDS:  ASA '81mg'$  (secondary stroke prevention) Current analgesics:  no Current triptans:  no Current anti-emetic:  no Current muscle relaxants:  no Current anti-anxiolytic:  no Current sleep aide:  no Current Antihypertensive medications:  amlodipine Current Antidepressant medications:  no Current Anticonvulsant medications:  no Current Vitamins/Herbal/Supplements:  no Current Antihistamines/Decongestants:  no Other therapy:  no   Caffeine:  Only to treat headache Alcohol:  Wine rarely Smoker:  no Diet:  Hydrates.  No processed foods.  Vegan Exercise:  Walks daily Depression: no; Anxiety:  no Sleep hygiene:  Good.  However, she does feel fatigued during the day  She was referred for Botox regarding her right upper extremity spasticity.  She is seeing Dr. Laverna Peace.  Carotid doppler from 10/26/17 showed no hemodynamically significant ICA stenosis.  HISTORY:  Onset:  "all of my life" Location:  Left suboccipital region and bifrontal Quality:  nonthrobbing Intensity:  moderate Aura:  no Prodrome:  no Postdrome:  no Associated symptoms:  Photophobia, some phonophobia.  No nausea, vomiting or visual disturbance..  She has not had any new worse headache of her life, waking up from sleep.  She does wake up with headache. Duration:  All day Frequency:  Varies.  May go 2 to 4 weeks with no headache and then have several weeks to months with 3 days of headache weekly. Frequency of abortive medication: no Triggers/exacerbating factors:  no Relieving  factors:  Walking, rubbing nutmeg on temples Activity:  Able to function   Past NSAIDS:  Ibuprofen, Aleve Past analgesics:  Excedrin, Tylenol, Fioricet Past abortive triptans:  no Past muscle relaxants:  no Past anti-emetic:  no Past antihypertensive medications:  no Past antidepressant medications:  no Past anticonvulsant medications:  no Past vitamins/Herbal/Supplements:  turmeric Other past therapies:  no   Family history of headache:  no   She had a left MCA ischemic stroke in September 2015. She takes ASA '81mg'$  daily.  She is on a statin. MRI of brain from 04/19/16 personally reviewed and revealed remote left MCA territory infarct and empty sella.  PAST MEDICAL HISTORY: Past Medical History:  Diagnosis Date  . Diabetes mellitus without complication ([place]) 1062   previously on [place]   . Gestational diabetes 2009   . Hypertension 09/07/2014  . Stroke ([place]) 09/07/2014   R side. weakness in upper arm. No deficit in leg. deficit in speech. A littel bit in coordination.     MEDICATIONS: Current Outpatient Medications on File Prior to Visit  Medication Sig Dispense Refill  . amLODipine (NORVASC) 10 MG tablet Take 1 tablet (10 mg total) by mouth daily. 30 tablet 5  . aspirin EC 81 MG tablet Take 1 tablet (81 mg total) by mouth daily. 100 tablet 1  . Blood Glucose Monitoring Suppl (TRUE METRIX METER) w/Device KIT 1 each by Does not apply route as needed. 1 kit 0  . butalbital-acetaminophen-caffeine (FIORICET, ESGIC) 50-325-40 MG tablet Take 1-2 tablets by mouth every 6 (six) hours as needed for headache. 20 tablet 0  . diazepam (VALIUM) 5 MG tablet Take one tablet one hour prior  to procedure 1 tablet 0  . Elastic Bandages & Supports (FINGER SPLINT) MISC 1 each by Does not apply route daily. On Left thumb and ring finger 2 each 0  . ferrous sulfate 300 (60 Fe) MG/5ML syrup Take 5 mLs (300 mg total) by mouth 2 (two) times daily with a meal. 300 mL 5  . glipiZIDE (GLUCOTROL) 10 MG  tablet Take 1 tablet (10 mg total) by mouth 2 (two) times daily before a meal. 60 tablet 5  . glucose blood (TRUE METRIX BLOOD GLUCOSE TEST) test strip 1 each by Other route 2 (two) times daily. 100 each 11  . lovastatin (MEVACOR) 40 MG tablet Take 1 tablet (40 mg total) by mouth at bedtime. 30 tablet 6  . sitaGLIPtin-metformin (JANUMET) 50-1000 MG tablet Take 1 tablet by mouth 2 (two) times daily with a meal. 60 tablet 5  . TRUEPLUS LANCETS 28G MISC 1 each by Does not apply route 2 (two) times daily. 100 each 11  . TURMERIC PO Take by mouth.     No current facility-administered medications on file prior to visit.     ALLERGIES: Allergies  Allergen Reactions  . Lipitor [Atorvastatin] Other (See Comments)    Made me feel woozy    FAMILY HISTORY: Family History  Problem Relation Age of Onset  . Diabetes Mother   . Hypertension Mother   . Epilepsy Father   . Hypertension Sister     SOCIAL HISTORY: Social History   Socioeconomic History  . Marital status: Married    Spouse name: Not on file  . Number of children: 5  . Years of education: associates  . Highest education level: Not on file  Social Needs  . Financial resource strain: Not on file  . Food insecurity - worry: Not on file  . Food insecurity - inability: Not on file  . Transportation needs - medical: Not on file  . Transportation needs - non-medical: Not on file  Occupational History  . Occupation: disabled  Tobacco Use  . Smoking status: Never Smoker  . Smokeless tobacco: Never Used  Substance and Sexual Activity  . Alcohol use: No  . Drug use: No  . Sexual activity: Not on file  Other Topics Concern  . 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    REVIEW OF SYSTEMS: Constitutional: No fevers, chills, or sweats, no generalized fatigue, change in appetite Eyes: No visual changes, double vision, eye pain Ear, nose and  throat: No hearing loss, ear pain, nasal congestion, sore throat Cardiovascular: No chest pain, palpitations Respiratory:  No shortness of breath at rest or with exertion, wheezes GastrointestinaI: No nausea, vomiting, diarrhea, abdominal pain, fecal incontinence Genitourinary:  No dysuria, urinary retention or frequency Musculoskeletal:  No neck pain, back pain Integumentary: No rash, pruritus, skin lesions Neurological: as above Psychiatric: No depression, insomnia, anxiety Endocrine: No palpitations, fatigue, diaphoresis, mood swings, change in appetite, change in weight, increased thirst Hematologic/Lymphatic:  No purpura, petechiae. Allergic/Immunologic: no itchy/runny eyes, nasal congestion, recent allergic reactions, rashes  PHYSICAL EXAM: Vitals:   01/30/18 0917  BP: 120/80  Pulse: (!) 104  SpO2: 100%   General: No acute distress.  Patient appears well-groomed.   Head:  Normocephalic/atraumatic Eyes:  Fundi examined but not visualized Neck: supple, no paraspinal tenderness, full range of motion Heart:  Regular rate and rhythm Lungs:  Clear to auscultation bilaterally Back: No paraspinal tenderness  Neurological Exam: Mental status: alert and oriented to person, place, and time, recent and remote memory intact, fund of knowledge intact, attention and concentration intact, speech fluent and not dysarthric, language intact. Cranial nerves:  CN II-XII intact.  Bulk & Tone: spasticity of right hand, no fasciculations.  Motor:  4+/5 right hand grip/intrinsic hand muscles.  Otherwise, 5/5 throughout  Sensation: light touch sensation intact.  Deep Tendon Reflexes:  3+ right upper extremity, otherwise 2+ throughout.  Finger to nose testing:  Without dysmetria.  Gait:  Normal station and stride.    IMPRESSION: Chronic tension type headache Excessive daytime somnolence Right spastic monoparesis as late effect of stroke  PLAN: 1.  She would like to avoid starting a preventative for  now.  We will reschedule a sleep study.  If she does not have OSA, then I would start nortriptyline.   2.  Follow up in 3 months.  Jordan Clines, DO  CC: Karle Plumber, MD

## 2018-02-05 ENCOUNTER — Telehealth: Payer: Self-pay | Admitting: Neurology

## 2018-02-05 NOTE — Telephone Encounter (Signed)
Pt called and said she is supposed top be set up for aq sleep study and no one has contacted her yet

## 2018-02-06 NOTE — Telephone Encounter (Signed)
Called and spoke with Pt, advsd her referral was placed on 2/12 and it can take up to 10 days before they contact to schedule.

## 2018-02-19 ENCOUNTER — Ambulatory Visit: Payer: PPO | Admitting: Physical Medicine & Rehabilitation

## 2018-02-19 ENCOUNTER — Encounter: Payer: PPO | Attending: Physical Medicine & Rehabilitation

## 2018-02-19 ENCOUNTER — Encounter: Payer: Self-pay | Admitting: Physical Medicine & Rehabilitation

## 2018-02-19 VITALS — BP 153/86 | HR 72

## 2018-02-19 DIAGNOSIS — G8320 Monoplegia of upper limb affecting unspecified side: Secondary | ICD-10-CM | POA: Diagnosis not present

## 2018-02-19 NOTE — Progress Notes (Signed)
Subjective:    Patient ID: Jordan SandiferSylvia L Hill, female    DOB: March 10, 1966, 52 y.o.   MRN: [id number]  HPI 52 year old female with left CVA in 2015, resulting in right spastic hemiplegia. [facility] and [facility] in [place]. Did for outpatient visits in [place]. Marland Kitchen. Pain Inventory Average Pain 0 Pain Right Now 0 My pain is n/a  In the last 24 hours, has pain interfered with the following? General activity 1 Relation with others 1 Enjoyment of life 1 What TIME of day is your pain at its worst? n/a Sleep (in general) NA  Pain is worse with: none Pain improves with: heat/ice, therapy/exercise and TENS Relief from Meds: 0  Mobility how many minutes can you walk? 60 ability to climb steps?  yes do you drive?  yes transfers alone Do you have any goals in this area?  yes  Function disabled: date disabled 09/07/2014  Neuro/Psych spasms depression anxiety  Prior Studies Any changes since last visit?  no  Physicians involved in your care Any changes since last visit?  no   Family History  Problem Relation Age of Onset  . Diabetes Mother   . Hypertension Mother   . Epilepsy Father   . Hypertension Sister    Social History   Socioeconomic History  . Marital status: Married    Spouse name: Not on file  . Number of children: 5  . Years of education: associates  . Highest education level: Not on file  Social Needs  . Financial resource strain: Not on file  . Food insecurity - worry: Not on file  . Food insecurity - inability: Not on file  . Transportation needs - medical: Not on file  . Transportation needs - non-medical: Not on file  Occupational History  . Occupation: disabled  Tobacco Use  . Smoking status: Never Smoker  . Smokeless tobacco: Never Used  Substance and Sexual Activity  . Alcohol use: No  . Drug use: No  .  Sexual activity: Not on file  Other Topics Concern  . 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   Past Surgical History:  Procedure Laterality Date  . BREAST SURGERY    . CESAREAN SECTION    . TUBAL LIGATION  2009   Past Medical History:  Diagnosis Date  . Diabetes mellitus without complication ([facility]) 2013   previously on [place]   . Gestational diabetes 2009   . Hypertension 09/07/2014  . Stroke ([facility]) 09/07/2014   R side. weakness in upper arm. No deficit in leg. deficit in speech. A littel bit in coordination.    There were no vitals taken for this visit.  Opioid Risk Score:   Fall Risk Score:  `1  Depression screen PHQ 2/9  Depression screen [facility] 2/9 01/08/2018 12/29/2017 08/03/2017 06/22/2017 05/12/2017 03/14/2017 03/02/2017  Decreased Interest 1 0 2 0 0 1 0  Down, Depressed, Hopeless 1 0 3 0 0 2 1  PHQ - 2 Score 2 0 5 0 0 3 1  Altered sleeping - - 3 0 0 1 1  Tired, decreased energy - - 3 0 1 3 3   Change in appetite - - 2 0 0 1 0  Feeling bad or failure about yourself  - - 3 0 0 1 0  Trouble concentrating - -  0 0 0 1 0  Moving slowly or fidgety/restless - - 0 0 0 1 0  Suicidal thoughts - - 0 0 0 0 0  PHQ-9 Score - - 16 0 1 11 5   Difficult doing work/chores - - - - - Somewhat difficult -    Review of Systems  Neurological:       Spasms  Psychiatric/Behavioral: Positive for dysphoric mood. The patient is nervous/anxious.   All other systems reviewed and are negative.      Objective:   Physical Exam  Constitutional: She is oriented to person, place, and time. She appears well-developed and well-nourished. No distress.  HENT:  Head: Normocephalic and atraumatic.  Eyes: Conjunctivae and EOM are normal. Pupils are equal, round, and reactive to light.  Neurological: She is alert and oriented to person, place, and time. Coordination abnormal.  Speech mildly dysfluent Motor strength is 5/5  left upper extremity left lower extremity, 4/5 in the right deltoid, bicep, tricep, grip    Skin: She is not diaphoretic.  Psychiatric: She has a normal mood and affect.  Nursing note and vitals reviewed.   RIght middle finger PIP and DIP spasticity MAS 3  index ring, little fingers and thumb MAS 0 Biceps MAS 1     Assessment & Plan:  1.  Spastic monoparesis right upper limb mainly affected, would make adjustments to her Botox to improve tone reduction in the right third digit.  We will repeat in 6 weeks at the following dosages.  FDS 50 FDP 50 focus on 3rd dig FPL 25 We will assess her other muscle groups at that time and may in addition treat as needed. We will follow up with OT referral next visit.

## 2018-02-20 ENCOUNTER — Telehealth: Payer: Self-pay | Admitting: Neurology

## 2018-02-20 DIAGNOSIS — G4719 Other hypersomnia: Secondary | ICD-10-CM

## 2018-02-20 NOTE — Telephone Encounter (Signed)
Sleep study was entered incorrectly. New order placed.

## 2018-02-21 DIAGNOSIS — Z01419 Encounter for gynecological examination (general) (routine) without abnormal findings: Secondary | ICD-10-CM | POA: Diagnosis not present

## 2018-02-21 MED FILL — AMLODIPINE BESYLATE 10 MG T: 10 | 30 days supply | Qty: 30 | Fill #2

## 2018-02-22 DIAGNOSIS — E785 Hyperlipidemia, unspecified: Secondary | ICD-10-CM | POA: Diagnosis not present

## 2018-02-22 DIAGNOSIS — H538 Other visual disturbances: Secondary | ICD-10-CM | POA: Diagnosis not present

## 2018-02-22 DIAGNOSIS — Z113 Encounter for screening for infections with a predominantly sexual mode of transmission: Secondary | ICD-10-CM | POA: Diagnosis not present

## 2018-02-22 DIAGNOSIS — Z720 Tobacco use: Secondary | ICD-10-CM | POA: Diagnosis not present

## 2018-02-22 DIAGNOSIS — Z136 Encounter for screening for cardiovascular disorders: Secondary | ICD-10-CM | POA: Diagnosis not present

## 2018-02-22 DIAGNOSIS — I10 Essential (primary) hypertension: Secondary | ICD-10-CM | POA: Diagnosis not present

## 2018-02-22 DIAGNOSIS — E1165 Type 2 diabetes mellitus with hyperglycemia: Secondary | ICD-10-CM | POA: Diagnosis not present

## 2018-02-22 DIAGNOSIS — Z5181 Encounter for therapeutic drug level monitoring: Secondary | ICD-10-CM | POA: Diagnosis not present

## 2018-02-22 DIAGNOSIS — Z01118 Encounter for examination of ears and hearing with other abnormal findings: Secondary | ICD-10-CM | POA: Diagnosis not present

## 2018-02-23 DIAGNOSIS — E1165 Type 2 diabetes mellitus with hyperglycemia: Secondary | ICD-10-CM | POA: Diagnosis not present

## 2018-02-23 DIAGNOSIS — Z01118 Encounter for examination of ears and hearing with other abnormal findings: Secondary | ICD-10-CM | POA: Diagnosis not present

## 2018-02-23 DIAGNOSIS — H9209 Otalgia, unspecified ear: Secondary | ICD-10-CM | POA: Diagnosis not present

## 2018-02-23 DIAGNOSIS — Z720 Tobacco use: Secondary | ICD-10-CM | POA: Diagnosis not present

## 2018-02-23 DIAGNOSIS — Z5181 Encounter for therapeutic drug level monitoring: Secondary | ICD-10-CM | POA: Diagnosis not present

## 2018-02-23 DIAGNOSIS — I119 Hypertensive heart disease without heart failure: Secondary | ICD-10-CM | POA: Diagnosis not present

## 2018-02-23 DIAGNOSIS — Z136 Encounter for screening for cardiovascular disorders: Secondary | ICD-10-CM | POA: Diagnosis not present

## 2018-02-23 DIAGNOSIS — E785 Hyperlipidemia, unspecified: Secondary | ICD-10-CM | POA: Diagnosis not present

## 2018-02-23 DIAGNOSIS — R011 Cardiac murmur, unspecified: Secondary | ICD-10-CM | POA: Diagnosis not present

## 2018-02-23 DIAGNOSIS — H538 Other visual disturbances: Secondary | ICD-10-CM | POA: Diagnosis not present

## 2018-02-23 DIAGNOSIS — I10 Essential (primary) hypertension: Secondary | ICD-10-CM | POA: Diagnosis not present

## 2018-02-26 ENCOUNTER — Ambulatory Visit: Payer: PPO | Admitting: Internal Medicine

## 2018-03-09 DIAGNOSIS — E1165 Type 2 diabetes mellitus with hyperglycemia: Secondary | ICD-10-CM | POA: Diagnosis not present

## 2018-03-09 DIAGNOSIS — I119 Hypertensive heart disease without heart failure: Secondary | ICD-10-CM | POA: Diagnosis not present

## 2018-03-09 DIAGNOSIS — I10 Essential (primary) hypertension: Secondary | ICD-10-CM | POA: Diagnosis not present

## 2018-03-09 DIAGNOSIS — D649 Anemia, unspecified: Secondary | ICD-10-CM | POA: Diagnosis not present

## 2018-03-09 DIAGNOSIS — Z720 Tobacco use: Secondary | ICD-10-CM | POA: Diagnosis not present

## 2018-03-09 DIAGNOSIS — E785 Hyperlipidemia, unspecified: Secondary | ICD-10-CM | POA: Diagnosis not present

## 2018-03-19 ENCOUNTER — Ambulatory Visit (HOSPITAL_BASED_OUTPATIENT_CLINIC_OR_DEPARTMENT_OTHER): Payer: PPO | Attending: Neurology | Admitting: Internal Medicine

## 2018-03-19 VITALS — Ht 59.0 in | Wt 180.0 lb

## 2018-03-19 DIAGNOSIS — G4719 Other hypersomnia: Secondary | ICD-10-CM | POA: Diagnosis not present

## 2018-03-22 DIAGNOSIS — E1165 Type 2 diabetes mellitus with hyperglycemia: Secondary | ICD-10-CM | POA: Diagnosis not present

## 2018-03-22 DIAGNOSIS — D500 Iron deficiency anemia secondary to blood loss (chronic): Secondary | ICD-10-CM | POA: Diagnosis not present

## 2018-03-22 DIAGNOSIS — Z8673 Personal history of transient ischemic attack (TIA), and cerebral infarction without residual deficits: Secondary | ICD-10-CM | POA: Diagnosis not present

## 2018-03-22 DIAGNOSIS — I10 Essential (primary) hypertension: Secondary | ICD-10-CM | POA: Diagnosis not present

## 2018-03-22 DIAGNOSIS — Z0000 Encounter for general adult medical examination without abnormal findings: Secondary | ICD-10-CM | POA: Diagnosis not present

## 2018-03-22 DIAGNOSIS — G8191 Hemiplegia, unspecified affecting right dominant side: Secondary | ICD-10-CM | POA: Diagnosis not present

## 2018-03-22 DIAGNOSIS — I119 Hypertensive heart disease without heart failure: Secondary | ICD-10-CM | POA: Diagnosis not present

## 2018-03-22 DIAGNOSIS — E785 Hyperlipidemia, unspecified: Secondary | ICD-10-CM | POA: Diagnosis not present

## 2018-03-22 DIAGNOSIS — Z720 Tobacco use: Secondary | ICD-10-CM | POA: Diagnosis not present

## 2018-03-22 DIAGNOSIS — N920 Excessive and frequent menstruation with regular cycle: Secondary | ICD-10-CM | POA: Diagnosis not present

## 2018-03-24 DIAGNOSIS — G4719 Other hypersomnia: Secondary | ICD-10-CM | POA: Diagnosis not present

## 2018-03-24 NOTE — Procedures (Signed)
   Patient Name: [name], [place] Study Date: 03/19/2018 Gender: Female D.O.B: 1966/08/21 Age (years): 51 Referring Provider: Drema DallasAdam R Jaffe Height (inches): 59 Interpreting Physician: Jetty Duhamellinton Keelin Sheridan MD, ABSM Weight (lbs): 180 RPSGT: Lowry RamMckinney, Take[place] BMI: 36 MRN: [id number] Neck Size: 14.50  CLINICAL INFORMATION Sleep Study Type: NPSG Indication for sleep study: Daytime Fatigue, Diabetes, Hypertension, Morning Headaches, Obesity, Snoring  Epworth Sleepiness Score: 10  SLEEP STUDY TECHNIQUE As per the AASM Manual for the Scoring of Sleep and Associated Events v2.3 (April 2016) with a hypopnea requiring 4% desaturations.  The channels recorded and monitored were frontal, central and occipital EEG, electrooculogram (EOG), submentalis EMG (chin), nasal and oral airflow, thoracic and abdominal wall motion, anterior tibialis EMG, snore microphone, electrocardiogram, and pulse oximetry.  MEDICATIONS Medications self-administered by patient taken the night of the study : none reported  SLEEP ARCHITECTURE The study was initiated at 10:32:12 PM and ended at 4:51:14 AM.  Sleep onset time was 0.7 minutes and the sleep efficiency was 96.2%%. The total sleep time was 364.8 minutes.  Stage REM latency was 42.5 minutes.  The patient spent 2.1%% of the night in stage N1 sleep, 71.0%% in stage N2 sleep, 0.0%% in stage N3 and 26.94% in REM.  Alpha intrusion was absent.  Supine sleep was 28.78%.  RESPIRATORY PARAMETERS The overall apnea/hypopnea index (AHI) was 8.7 per hour. There were 10 total apneas, including 10 obstructive, 0 central and 0 mixed apneas. There were 43 hypopneas and 6 RERAs.  The AHI during Stage REM sleep was 22.6 per hour.  AHI while supine was 12.6 per hour.  The mean oxygen saturation was 95.7%. The minimum SpO2 during sleep was 84.0%.  moderate snoring was noted during this study.  CARDIAC DATA The 2 lead EKG demonstrated sinus rhythm. The mean heart rate was 78.6  beats per minute. Other EKG findings include: PVCs.  LEG MOVEMENT DATA The total PLMS were 0 with a resulting PLMS index of 0.0. Associated arousal with leg movement index was 5.9 .  IMPRESSIONS - Mild obstructive sleep apnea occurred during this study (AHI = 8.7/h). - No significant central sleep apnea occurred during this study (CAI = 0.0/h). - Mild oxygen desaturation was noted during this study (Min O2 = 84.0%). Mean 95.7% - The patient snored with moderate snoring volume. - EKG findings include PVCs. - Mild periodic limb movements did occur during sleep. Associated arousals were minimal (PLMA 5.9/ hr).  DIAGNOSIS - Obstructive Sleep Apnea (327.23 [G47.33 ICD-10]) - Periodic Limb Movement During Sleep (327.51 [G47.61 ICD-10])  RECOMMENDATIONS - Treatment for mild OSA is directed at symptoms. If conservative measeures are insufficient, consider a CPAP titration sleep study, autopap, fitted oral appliance, Sleep Medicine referral or ENT evaluation, based on clinical judgment. - Mirapex, Requip, or Sinemet might be considered for treatment of Periodic Leg Movements of Sleep if appropriate. - Be careful with alcohol, sedatives and other CNS depressants that may worsen sleep apnea and disrupt normal sleep architecture. - Sleep hygiene should be reviewed to assess factors that may improve sleep quality. - Weight management and regular exercise should be initiated or continued if appropriate.  [Electronically signed] 03/24/2018 12:43 PM  Jetty Duhamellinton Selin Eisler MD, ABSM Diplomate, American Board of Sleep Medicine   NPI: [id number] Jetty Duhamellinton Isidra Mings Diplomate, American Board of Sleep Medicine  ELECTRONICALLY SIGNED ON:  03/24/2018, 12:35 PM [facility] PH: [phone]   FX: [phone] ACCREDITED BY THE AMERICAN ACADEMY OF SLEEP MEDICINE

## 2018-03-27 ENCOUNTER — Telehealth: Payer: Self-pay

## 2018-03-27 ENCOUNTER — Telehealth: Payer: Self-pay | Admitting: Neurology

## 2018-03-27 DIAGNOSIS — N924 Excessive bleeding in the premenopausal period: Secondary | ICD-10-CM | POA: Diagnosis not present

## 2018-03-27 DIAGNOSIS — R9389 Abnormal findings on diagnostic imaging of other specified body structures: Secondary | ICD-10-CM | POA: Diagnosis not present

## 2018-03-27 MED ORDER — NORTRIPTYLINE HCL 25 MG PO CAPS: 25.0000 mg | ORAL_CAPSULE | Freq: Every day | ORAL | 1 refills | Status: DC

## 2018-03-27 NOTE — Telephone Encounter (Signed)
Called and spoke with Pt, advsd her of sleep study results and recommendations. Advsd her of nortriptyline, confirmed pharmacy. Pt will call in 4 weeks with update.

## 2018-03-27 NOTE — Telephone Encounter (Signed)
-----   Message from Drema DallasAdam R Jaffe, DO sent at 03/26/2018  1:20 PM EDT ----- Sleep study does demonstrate mild obstructive sleep apnea.  Conservative management is recommended first.  Recommend exercise and weight loss.  It is also recommended that she sleep on her side.  Also, alcohol avoidance is recommended.  If daytime somnolence continues, then we need to consider CPAP.    In the meantime, we can start a daily medication to try and reduce frequency of headaches.  Typically, I recommend nortriptyline 25mg  at bedtime.  It is an antidepressant that is commonly used to treat her type of headaches.  We can increase dose in 4 weeks if needed (she will need to contact us with an update).  Side effects may include sleepiness and dizziness (which is why it is commonly taken at night) and dry mouth (so she should be drinking plenty of water).

## 2018-03-27 NOTE — Telephone Encounter (Signed)
Patient called back after speaking with you needing to see if you can fax her results that you gave to her over the phone , if they can be faxed to her PCP Dr. Isidore MoosBonsei, Donne Anonusi. Thanks

## 2018-03-28 NOTE — Telephone Encounter (Signed)
Sent to Dr. Jackie PlumGeorge Osei-Bonsu

## 2018-03-30 ENCOUNTER — Ambulatory Visit: Payer: PPO | Admitting: Registered"

## 2018-04-02 ENCOUNTER — Encounter: Payer: PPO | Attending: Physical Medicine & Rehabilitation

## 2018-04-02 ENCOUNTER — Ambulatory Visit (HOSPITAL_BASED_OUTPATIENT_CLINIC_OR_DEPARTMENT_OTHER): Payer: PPO | Admitting: Physical Medicine & Rehabilitation

## 2018-04-02 VITALS — BP 164/85 | HR 91 | Ht 59.0 in | Wt 182.6 lb

## 2018-04-02 DIAGNOSIS — G8320 Monoplegia of upper limb affecting unspecified side: Secondary | ICD-10-CM | POA: Diagnosis not present

## 2018-04-02 NOTE — Patient Instructions (Signed)

## 2018-04-03 NOTE — Procedures (Signed)
Botox Injection for spasticity using needle EMG guidance  Dilution: 50 Units/ml Indication: Severe spasticity which interferes with ADL,mobility and/or  hygiene and is unresponsive to medication management and other conservative care Informed consent was obtained after describing risks and benefits of the procedure with the patient. This includes bleeding, bruising, infection, excessive weakness, or medication side effects. A REMS form is on file and signed. Needle: 27g 1" needle electrode Number of units per muscle FDS 50 FDP 50 focus on 3rd dig FPL 25 PQ 25 FCR 50 All injections were done after obtaining appropriate EMG activity and after negative drawback for blood. The patient tolerated the procedure well. Post procedure instructions were given. A followup appointment was made.

## 2018-04-03 NOTE — Progress Notes (Signed)
See procedure note.

## 2018-04-05 ENCOUNTER — Encounter: Payer: PPO | Attending: Internal Medicine | Admitting: Registered"

## 2018-04-05 DIAGNOSIS — Z713 Dietary counseling and surveillance: Secondary | ICD-10-CM | POA: Insufficient documentation

## 2018-04-05 DIAGNOSIS — E119 Type 2 diabetes mellitus without complications: Secondary | ICD-10-CM | POA: Insufficient documentation

## 2018-04-05 DIAGNOSIS — IMO0002 Reserved for concepts with insufficient information to code with codable children: Secondary | ICD-10-CM

## 2018-04-05 DIAGNOSIS — E118 Type 2 diabetes mellitus with unspecified complications: Secondary | ICD-10-CM

## 2018-04-05 DIAGNOSIS — E1165 Type 2 diabetes mellitus with hyperglycemia: Secondary | ICD-10-CM

## 2018-04-05 NOTE — Patient Instructions (Addendum)
Because your doctor has recommended you take an iron supplement, but the prescription was more than than you want to pay, you can consider an iron supplement by [employer] that may be less likely to cause constipation:  Iron Extra T ~ Enhanced Iron Absorption. [employer] [address] carries this product according to the [employer].  You can try ideas of food combinations from the foods high in iron and vit C list to help get more iron in the diet.  With your vegan diet it is important that you pay attention to your protein sources to get enough protein with each meal and snack. This will also help control blood sugar when eating carbohydrates.  The handout I mailed to you 'USDA 10 tips: healthy eating for vegetarians' can help with ideas to get protein and vitamins in your diet. A great idea you can up with for your taco idea is to add lentils.  For your smoothies, keep your fruits to 1/2 banana and about 1 cup berries or other fruit, use a liquid that will not add to the carbohydrates and be sure to include a protein powder.   When snacking on fruit, be mindful of portion size and be sure to include protein such as nuts.  Testing your blood sugar can be a way to get feedback how your body's blood sugar responded to meals as well as overall blood sugar control. Consider continuing checking before you eat in the morning. A good way to check your meals is to wait 2 hours after eating and check your blood sugar. When your blood sugar gets in the following ranges, you will be close to an A1c of 7%: Fasting: 80-130 mg/dL; 2 hours after a meal less than 180 mg/dL.  Because you have a difficult time swallowing the Janumet medication, you may want to consider talking to your doctor about other medications which would be easier for you to take. You mentioned that you had tried Victoza before and it worked okay for you, this may be something you want to bring up with your doctor.

## 2018-04-05 NOTE — Progress Notes (Signed)
Diabetes Self-Management Education  Visit Type: First/Initial  Appt. Start Time: 1605 Appt. End Time: 1710  04/09/2018  [name], identified by name and date of birth, is a 52 y.o. female with a diagnosis of Diabetes: Type 2.   ASSESSMENT Patient states she values her independence. Pt states her her right-sided weakness s/p stroke in 2015 affects her ability to use her right hand, has affected her memory, and has made her speech a little slower, but she is able to drive and take care of herself. Patient states she has been a vegan for about a year and does not take vitamins to supplement B vitamins. Patient states she is anemic and her doctor recommended she take an iron supplement, but she states it was too expensive as well as she does not like the constipation effect of iron supplements.  Patient states she has a juicer that separates the pulp from the juice and she will use the carrot pulp in other recipes. Patient states she uses coconut water for the liquid in her smoothies. RD discussed potential effect on BG.  Patient states the Janumet makes her gag and sometimes makes her throw up so she does not take it on a regular basis. Per chart A1c is 9.5% 02/23/2018.  Diabetes Self-Management Education - 04/09/18 1610      Visit Information   Visit Type  First/Initial      Initial Visit   Diabetes Type  Type 2    Are you currently following a meal plan?  Yes    What type of meal plan do you follow?  vegan x1 yr    Are you taking your medications as prescribed?  No janumet made her throw up. large pills make her gag    Date Diagnosed  2013      Health Coping   How would you rate your overall health?  Fair      Psychosocial Assessment   Patient Belief/Attitude about Diabetes  Defeat/Burnout    How often do you need to have someone help you when you read instructions, pamphlets, or other written materials from your doctor or pharmacy?  1 - Never    What is the last grade level  you completed in school?  2 yrs college      Complications   Last HgB A1C per patient/outside source  9.5 %    How often do you check your blood sugar?  1-2 times/day    Fasting Blood glucose range (mg/dL)  [phone]    Number of hypoglycemic episodes per month  0    Have you had a dilated eye exam in the past 12 months?  Yes    Have you had a dental exam in the past 12 months?  No has appt in July    Are you checking your feet?  Yes    How many days per week are you checking your feet?  3      Dietary Intake   Breakfast  Juice (makes own) or Smoothie OR spinach sweet potato OR rasin bran w almond milk    Snack (morning)  nuts or apples    Lunch  mush, avocado, onion, wheat tortilla, salad, quinoa ~3 pm    Snack (afternoon)  fruit    Dinner  none    Beverage(s)  water, coconut water      Exercise   Exercise Type  Light (walking / raking leaves)    How many days per week to you  exercise?  7    How many minutes per day do you exercise?  90    Total minutes per week of exercise  630      Patient Education   Previous Diabetes Education  Yes (please comment) at diag 2013 in [place]    Nutrition management   Role of diet in the treatment of diabetes and the relationship between the three main macronutrients and blood glucose level    Medications  Reviewed patients medication for diabetes, action, purpose, timing of dose and side effects.    Monitoring  Identified appropriate SMBG and/or A1C goals.    Psychosocial adjustment  Role of stress on diabetes      Individualized Goals (developed by patient)   Nutrition  General guidelines for healthy choices and portions discussed    Monitoring   test my blood glucose as discussed    Problem Solving  discuss trouble taking medications with your doctor      Outcomes   Expected Outcomes  Demonstrated interest in learning. Expect positive outcomes    Future DMSE  PRN    Program Status  Completed      Individualized Plan for  Diabetes Self-Management Training:   Learning Objective:  Patient will have a greater understanding of diabetes self-management. Patient education plan is to attend individual and/or group sessions per assessed needs and concerns.  Patient Instructions  Because your doctor has recommended you take an iron supplement, but the prescription was more than than you want to pay, you can consider an iron supplement by [employer] that may be less likely to cause constipation:  Iron Extra  ~ Enhanced Iron Absorption. [employer] [address] carries this product according to the [employer] website.  You can try ideas of food combinations from the foods high in iron and vit C list to help get more iron in the diet.  With your vegan diet it is important that you pay attention to your protein sources to get enough protein with each meal and snack. This will also help control blood sugar when eating carbohydrates.  The handout I mailed to you 'USDA 10 tips: healthy eating for vegetarians' can help with ideas to get protein and vitamins in your diet. A great idea you can up with for your taco idea is to add lentils.  For your smoothies, keep your fruits to 1/2 banana and about 1 cup berries or other fruit, use a liquid that will not add to the carbohydrates and be sure to include a protein powder.   When snacking on fruit, be mindful of portion size and be sure to include protein such as nuts.  Testing your blood sugar can be a way to get feedback how your body's blood sugar responded to meals as well as overall blood sugar control. Consider continuing checking before you eat in the morning. A good way to check your meals is to wait 2 hours after eating and check your blood sugar. When your blood sugar gets in the following ranges, you will be close to an A1c of 7%: Fasting: 80-130 mg/dL; 2 hours after a meal less than 180 mg/dL.  Because you have a difficult time swallowing the Janumet medication, you may  want to consider talking to your doctor about other medications which would be easier for you to take. You mentioned that you had tried Victoza before and it worked okay for you, this may be something you want to bring up with your doctor.  Expected Outcomes:  Demonstrated interest in learning. Expect positive outcomes  Education material provided: Support group flyer, [employer] flyer, DM medications, USDA 10 tips for vegetarians, Foods high in iron & vit C  If problems or questions, patient to contact team via:  Phone  Future DSME appointment: PRN

## 2018-04-09 ENCOUNTER — Encounter: Payer: Self-pay | Admitting: Registered"

## 2018-04-11 DIAGNOSIS — E113412 Type 2 diabetes mellitus with severe nonproliferative diabetic retinopathy with macular edema, left eye: Secondary | ICD-10-CM | POA: Diagnosis not present

## 2018-04-11 DIAGNOSIS — H3582 Retinal ischemia: Secondary | ICD-10-CM | POA: Diagnosis not present

## 2018-04-11 DIAGNOSIS — H47333 Pseudopapilledema of optic disc, bilateral: Secondary | ICD-10-CM | POA: Diagnosis not present

## 2018-04-11 DIAGNOSIS — E113411 Type 2 diabetes mellitus with severe nonproliferative diabetic retinopathy with macular edema, right eye: Secondary | ICD-10-CM | POA: Diagnosis not present

## 2018-04-12 MED FILL — acetaZOLAMIDE 250 MG TABS: 250 | 30 days supply | Qty: 60 | Fill #0

## 2018-04-18 DIAGNOSIS — R9389 Abnormal findings on diagnostic imaging of other specified body structures: Secondary | ICD-10-CM | POA: Diagnosis not present

## 2018-04-18 DIAGNOSIS — N924 Excessive bleeding in the premenopausal period: Secondary | ICD-10-CM | POA: Diagnosis not present

## 2018-04-18 DIAGNOSIS — N921 Excessive and frequent menstruation with irregular cycle: Secondary | ICD-10-CM | POA: Diagnosis not present

## 2018-04-23 DIAGNOSIS — E113512 Type 2 diabetes mellitus with proliferative diabetic retinopathy with macular edema, left eye: Secondary | ICD-10-CM | POA: Diagnosis not present

## 2018-04-24 ENCOUNTER — Ambulatory Visit: Payer: PPO | Attending: Physical Medicine & Rehabilitation | Admitting: Occupational Therapy

## 2018-04-24 ENCOUNTER — Other Ambulatory Visit: Payer: Self-pay

## 2018-04-24 DIAGNOSIS — R208 Other disturbances of skin sensation: Secondary | ICD-10-CM | POA: Diagnosis not present

## 2018-04-24 DIAGNOSIS — R278 Other lack of coordination: Secondary | ICD-10-CM | POA: Diagnosis not present

## 2018-04-24 DIAGNOSIS — I69351 Hemiplegia and hemiparesis following cerebral infarction affecting right dominant side: Secondary | ICD-10-CM | POA: Insufficient documentation

## 2018-04-24 NOTE — Therapy (Signed)
[facility] [address] [place], [place], [address] Phone: [phone]   Fax:  [phone]  Occupational Therapy Evaluation  Patient Details  Name: [name] MRN: [id number] Date of Birth: 50-07-1966 Referring Provider: Dr. Claudette Hill   Encounter Date: 50/06/2018  OT End of Session - 04/24/18 0904    Visit Number  1    Number of Visits  9    Date for OT Re-Evaluation  06/24/18    Authorization Type  [employer]    OT Start Time  0800    OT Stop Time  0850    OT Time Calculation (min)  50 min    Activity Tolerance  Patient tolerated treatment well    Behavior During Therapy  [facility] for tasks assessed/performed       Past Medical History:  Diagnosis Date  . Diabetes mellitus without complication (HCC) 2013   previously on victoza   . Gestational diabetes 2009   . Hypertension 09/07/2014  . Stroke (HCC) 09/07/2014   R side. weakness in upper arm. No deficit in leg. deficit in speech. A littel bit in coordination.     Past Surgical History:  Procedure Laterality Date  . BREAST SURGERY    . CESAREAN SECTION    . TUBAL LIGATION  2009    There were no vitals filed for this visit.  Subjective Assessment - 04/24/18 0806    Pertinent History  s/p recent botox RUE 04/02/18, PMH: CVA 09/07/2014, DM, HTN    Patient Stated Goals  Get more use of Rt hand    Currently in Pain?  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 Claudette Hill    Onset Date/Surgical Date  04/02/18 recent botox injections    Hand Dominance  Right (prior to CVA in 2015)    Prior Therapy  outpatient OT last spring 2018      Precautions   Precautions  None      Balance Screen   Has the patient fallen in the past 6 months  No    Has the patient had a decrease in activity  level because of a fear of falling?   No    Is the patient reluctant to leave their home because of a fear of falling?   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)       ADL   Eating/Feeding  Needs assist with cutting food has reverted back to using Lt hand (was using Rt hand 75% at D/C last year)   Grooming  Modified independent mostly w/ Lt hand, assist some w/ RT    Upper Body Bathing  Modified independent    Lower Body Bathing  Modified independent    Upper Body Dressing  Needs assist for fasteners    Lower Body Dressing  Needs assist for fasteners  Lexicographer - Clothing Manipulation  Modified independent;Increased time    Toileting -  Geneticist, molecular  Independent      IADL   Shopping  Takes care of all shopping needs independently    Light Housekeeping  Does personal laundry completely;Performs light daily tasks such as dishwashing, bed making    Meal Prep  Plans, prepares and serves adequate meals independently    Community Mobility  Drives own vehicle    Medication Management  Is responsible for taking medication in correct dosages at correct time    Physicist, medical financial matters independently (budgets, writes checks, pays rent, bills goes to bank), collects and keeps track of income however forgets sometimes and double pays bills      Mobility   Mobility Status  Independent      Written Expression   Dominant Hand  Right prior to CVA, using Lt hand now      Vision - History   Baseline Vision  -- pt reports she is supposed to be wearing glasses      Cognition    Cognition Comments  word finding difficulties noted (from CVA)       Observation/Other Assessments   Observations  At time of d/c from last outpatient OT last year, pt was using Rt hand 75% to eat (feed self) but pt reports reverting back to using Lt hand because it's easier. Pt was also using Rt hand to print her name but is using Lt hand entirely to write now. Pt had recent botox injections to Rt FDS, FDP, FPL, PQ, and FCR muscles.       Sensation   Hot/Cold  -- hypersensitive to hot/cold    Additional Comments  delayed, diminished Rt hand, not consistent w/ localizing stimuli      Coordination   9 Hole Peg Test  Right    Right 9 Hole Peg Test  able to place 2 pegs in 2 min.     Box and Blocks  Rt = 13  (pt got 12 at evaluation last year, but had 23 when last assessed at d/c)       Edema   Edema  none      Tone   Assessment Location  Right Upper Extremity      ROM / Strength   AROM / PROM / Strength  AROM      AROM   Overall AROM Comments  LUE AROM WNL's (no pain in shoulder as compared to last year, however did have pain in ring finger). RUE: Shoulder and elbow WFL's, but w/ decreased distal control. Supination 75%, wrist ext 90%. Full finger flexion, composite finger ext approx 75% w/ decr. PIP extension d/t spasticity      Hand Function   Right Hand Grip (lbs)  less than 5 lbs (18 lbs at eval last year)       RUE Tone   RUE Tone  Hypertonic;Mild      RUE Tone   Hypertonic Details  mild Rt forearm, wrist, and hand (moderate with PIP extension)                        OT Short Term Goals - 04/24/18 0922      OT SHORT TERM GOAL #1   Title  none        OT Long Term Goals - 04/24/18 1478  OT LONG TERM GOAL #1   Title  Independent with HEP     Time  8    Period  Weeks    Status  New    Target Date  06/24/18      OT LONG TERM GOAL #2   Title  Pt to improve RUE functional use as evidenced by performing 20 blocks or greater on Box & Blocks test     Baseline  EVAL = 13    Time  8    Period  Weeks    Status  New      OT LONG TERM GOAL #3   Title  Pt to return to eating 75% with Rt hand, and return to grooming 50% with Rt hand w/ A/E prn    Time  8    Period  Weeks    Status  New      OT LONG TERM GOAL #4   Title  Pt to verbalize understanding with all A/E options to increase ease and independence with ADLS     Time  8    Period  Weeks    Status  New      OT LONG TERM GOAL #5   Title  Pt will verbalize understanding with Ass[employer] services and contact info to prep her for increased independence with computer tasks for school related needs    Time  8    Period  Weeks    Status  New      Long Term Additional Goals   Additional Long Term Goals  Yes      OT LONG TERM GOAL #6   Title  Pt will verbalize understanding with how to properly set up and don/doff estim unit for home use     Time  8    Period  Weeks    Status  New            Plan - 04/24/18 0905    Clinical Impression Statement  Pt is a 52 y.o. female who presents to outpatient rehab s/p recent botox injections (FDS, FDP, FPL, PQ, FCR) on 04/02/18 to RUE to manage spastic monoplegia from CVA (09/07/2014). Pt had received outpatient O.T. last spring 2018 (evaluation 02/21/17) and had been seen for 14 visits,  meeting most of her goals, but had since declined in functional use of RUE and admitted to reverting back to using Lt non dominant hand for most BADLS because it was easier (eating, grooming, writing). Pt however agrees to focus on using Rt hand more and wants to use Rt hand for eating, even if more difficult. Therapist explained that if we were to focus on this, that she needed to want this too. Pt also explained that she is going back to school and would like to be able to type or at least have technology that would help her do this.     Occupational performance deficits (Please refer to evaluation for details):  ADL's;IADL's;Education    Rehab  Potential  Fair    Current Impairments/barriers affecting progress:  time since onset, poor carryover of recommendations even with improvement    OT Frequency  1x / week    OT Duration  8 weeks plus evaluation (pt unable to come 2x/wk for 4 weeks)    OT Treatment/Interventions  DME and/or AE instruction;Self-care/ADL training;Splinting;Therapeutic activities;Therapeutic exercise;Cognitive remediation/compensation;Coping strategies training;Neuromuscular education;Passive range of motion;Visual/perceptual remediation/compensation;Electrical Stimulation;Patient/family education    Plan  re-issue m-CIMT handout, re-issue A/E handouts, and printout from  03/13/17, begin estim exploration and assistive technology contact info, issue more red foam for untensils and toothbrush prn    Clinical Decision Making  Limited treatment options, no task modification necessary    Consulted and Agree with Plan of Care  Patient       Patient will benefit from skilled therapeutic intervention in order to improve the following deficits and impairments:  Decreased coordination, Decreased range of motion, Impaired flexibility, Impaired sensation, Impaired tone, Impaired UE functional use, Decreased knowledge of use of DME, Decreased strength, Impaired perceived functional ability, Decreased cognition, Decreased coping skills  Visit Diagnosis: Hemiplegia and hemiparesis following cerebral infarction affecting right dominant side (HCC) - Plan: Ot plan of care cert/re-cert  Other lack of coordination - Plan: Ot plan of care cert/re-cert  Other disturbances of skin sensation - Plan: Ot plan of care cert/re-cert    Problem List Patient Active Problem List   Diagnosis Date Noted  . Medically noncompliant 12/29/2017  . Microcytic anemia 09/26/2017  . 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 Aphasia, late effect of cerebrovascular disease 03/14/2017  . Dysarthria as late effect of cerebrovascular  accident (CVA) 03/14/2017  . Contracture of joint of finger of right hand 02/02/2017  . Right hand weakness 02/02/2017  . CVA (cerebral vascular accident) (HCC) 09/29/2015  . HTN (hypertension) 09/29/2015  . Diabetes mellitus type 2, uncontrolled, with complications (HCC) 09/29/2015  . Eczema 09/29/2015    Kelli Churn, OTR/L 04/24/2018, 9:29 AM  [facility] [address] [place], [place], [address] Phone: [phone]   Fax:  [phone]  Name: [name] MRN: [id number] Date of Birth: August 28, 1966

## 2018-05-01 ENCOUNTER — Ambulatory Visit: Payer: PPO | Admitting: Occupational Therapy

## 2018-05-06 ENCOUNTER — Telehealth: Payer: Self-pay | Admitting: Internal Medicine

## 2018-05-07 DIAGNOSIS — E113512 Type 2 diabetes mellitus with proliferative diabetic retinopathy with macular edema, left eye: Secondary | ICD-10-CM | POA: Diagnosis not present

## 2018-05-07 NOTE — Telephone Encounter (Signed)
Called patient to schedule an appt per PCP. Patient stated that she has found a new PCP outside of [facility].

## 2018-05-08 ENCOUNTER — Ambulatory Visit: Payer: PPO | Admitting: Occupational Therapy

## 2018-05-08 DIAGNOSIS — I69351 Hemiplegia and hemiparesis following cerebral infarction affecting right dominant side: Secondary | ICD-10-CM | POA: Diagnosis not present

## 2018-05-08 DIAGNOSIS — R278 Other lack of coordination: Secondary | ICD-10-CM

## 2018-05-08 DIAGNOSIS — R208 Other disturbances of skin sensation: Secondary | ICD-10-CM

## 2018-05-08 NOTE — Therapy (Signed)
[facility] [address] [place], [place], [address] Phone: [phone]   Fax:  [phone]  Occupational Therapy Treatment  Patient Details  Name: [name] MRN: [id number] Date of Birth: 1966/04/22 Referring Provider: Dr. Claudette Laws   Encounter Date: 05/08/2018  OT End of Session - 05/08/18 1309    Visit Number  2    Number of Visits  9    Date for OT Re-Evaluation  06/24/18    Authorization Type  [employer]    OT Start Time  0805    OT Stop Time  0850    OT Time Calculation (min)  45 min    Activity Tolerance  Patient tolerated treatment well    Behavior During Therapy  [facility] for tasks assessed/performed       Past Medical History:  Diagnosis Date  . Diabetes mellitus without complication (HCC) 2013   previously on victoza   . Gestational diabetes 2009   . Hypertension 09/07/2014  . Stroke (HCC) 09/07/2014   R side. weakness in upper arm. No deficit in leg. deficit in speech. A littel bit in coordination.     Past Surgical History:  Procedure Laterality Date  . BREAST SURGERY    . CESAREAN SECTION    . TUBAL LIGATION  2009    There were no vitals filed for this visit.  Subjective Assessment - 05/08/18 0806    Subjective   I had an injection in my eye for diabetes    Pertinent History  s/p recent botox RUE 04/02/18, PMH: CVA 09/07/2014, DM, HTN    Patient Stated Goals  Get more use of Rt hand    Currently in Pain?  No/denies                   OT Treatments/Exercises (OP) - 05/08/18 0001      ADLs   ADL Comments  Pt issued more red foam for eating utensils. Pt also reissued handouts on recommended A/E (including: one hand cutting board, rocker knife, pot stabalizer, shoe buttons, jar openers) and told how/where to purchase. Pt also given Assistive technology contact info/address for computer use for school tasks. Pt also given estim unit info and how to order if interested  for home use. Pt reissued recommendations/printout from 03/13/17, and re-issued and reviewed m-CIMT ex's to perform with Rt hand for home.   Also discussed things to avoid with Rt hand including: NO  cutting, chopping with Rt hand.             OT Education - 05/08/18 1310    Education provided  Yes    Education Details  m-CIMT activities to perform with Rt hand, A/E recommendations, estim unit recommendations, Scientist, physiological) Educated  Patient    Methods  Explanation;Handout    Comprehension  Verbalized understanding       OT Short Term Goals - 04/24/18 0922      OT SHORT TERM GOAL #1   Title  none        OT Long Term Goals - 05/08/18 1309      OT LONG TERM GOAL #1   Title  Independent with HEP     Time  8    Period  Weeks    Status  On-going      OT LONG TERM GOAL #2   Title  Pt to improve RUE functional use as evidenced by performing 20 blocks or greater  on Box & Blocks test    Baseline  EVAL = 13    Time  8    Period  Weeks    Status  New      OT LONG TERM GOAL #3   Title  Pt to return to eating 75% with Rt hand, and return to grooming 50% with Rt hand w/ A/E prn    Time  8    Period  Weeks    Status  On-going      OT LONG TERM GOAL #4   Title  Pt to verbalize understanding with all A/E options to increase ease and independence with ADLS     Time  8    Period  Weeks    Status  On-going      OT LONG TERM GOAL #5   Title  Pt will verbalize understanding with Assist[employer] Technology services and contact info to prep her for increased independence with computer tasks for school related needs    Time  8    Period  Weeks    Status  Achieved      OT LONG TERM GOAL #6   Title  Pt will verbalize understanding with how to properly set up and don/doff estim unit for home use     Time  8    Period  Weeks    Status  New            Plan - 05/08/18 1311    Clinical Impression Statement  Pt issued extensive amount of handouts  today (some were already previously issued last year, but pt has since lost) re: functional safe activities to perform with Rt hand, A/E recommendations to increase ease, safety, and independence with ADLS, and info for estim unit and assistive technology program. Pt approximating some goals     Occupational performance deficits (Please refer to evaluation for details):  ADL's;IADL's;Education    Rehab Potential  Fair    Current Impairments/barriers affecting progress:  time since onset, poor carryover of recommendations even with improvement    OT Frequency  1x / week    OT Duration  8 weeks    OT Treatment/Interventions  DME and/or AE instruction;Self-care/ADL training;Splinting;Therapeutic activities;Therapeutic exercise;Cognitive remediation/compensation;Coping strategies training;Neuromuscular education;Passive range of motion;Visual/perceptual remediation/compensation;Electrical Stimulation;Patient/family education    Plan  review further m-CIMT ex's, review things NOT to do with Rt hand (nothing sharp, heavy, breakable, hot)    Consulted and Agree with Plan of Care  Patient       Patient will benefit from skilled therapeutic intervention in order to improve the following deficits and impairments:  Decreased coordination, Decreased range of motion, Impaired flexibility, Impaired sensation, Impaired tone, Impaired UE functional use, Decreased knowledge of use of DME, Decreased strength, Impaired perceived functional ability, Decreased cognition, Decreased coping skills  Visit Diagnosis: Hemiplegia and hemiparesis following cerebral infarction affecting right dominant side (HCC)  Other lack of coordination  Other disturbances of skin sensation    Problem List Patient Active Problem List   Diagnosis Date Noted  . Medically noncompliant 12/29/2017  . Microcytic anemia 09/26/2017  . 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 Aphasia, late effect of cerebrovascular disease  03/14/2017  . Dysarthria as late effect of cerebrovascular accident (CVA) 03/14/2017  . Contracture of joint of finger of right hand 02/02/2017  . Right hand weakness 02/02/2017  . CVA (cerebral vascular accident) (HCC) 09/29/2015  . HTN (hypertension) 09/29/2015  . Diabetes mellitus type 2, uncontrolled, with complications (HCC)  09/29/2015  . Eczema 09/29/2015    Kelli Churn, OTR/L 05/08/2018, 1:14 PM  [facility] [address] [place], [place], [address] Phone: [phone]   Fax:  [phone]  Name: [name] MRN: [id number] Date of Birth: 26-Oct-1966

## 2018-05-10 ENCOUNTER — Encounter: Payer: Self-pay | Admitting: Neurology

## 2018-05-10 ENCOUNTER — Ambulatory Visit (INDEPENDENT_AMBULATORY_CARE_PROVIDER_SITE_OTHER): Payer: PPO | Admitting: Neurology

## 2018-05-10 VITALS — BP 144/80 | HR 94 | Ht 59.0 in | Wt 182.0 lb

## 2018-05-10 DIAGNOSIS — G4761 Periodic limb movement disorder: Secondary | ICD-10-CM | POA: Diagnosis not present

## 2018-05-10 DIAGNOSIS — I10 Essential (primary) hypertension: Secondary | ICD-10-CM

## 2018-05-10 DIAGNOSIS — G44229 Chronic tension-type headache, not intractable: Secondary | ICD-10-CM | POA: Diagnosis not present

## 2018-05-10 DIAGNOSIS — G4733 Obstructive sleep apnea (adult) (pediatric): Secondary | ICD-10-CM | POA: Diagnosis not present

## 2018-05-10 DIAGNOSIS — I69339 Monoplegia of upper limb following cerebral infarction affecting unspecified side: Secondary | ICD-10-CM | POA: Diagnosis not present

## 2018-05-10 NOTE — Progress Notes (Signed)
NEUROLOGY FOLLOW UP OFFICE NOTE  [name] [id number]  HISTORY OF PRESENT ILLNESS: [name] is a 52 year old female with diabetes, hyperlipidemia, hypertension and residual right sided weakness as late effect of stroke who follows up for chronic tension-type headache.   UPDATE: She tried nortriptyline '25mg'$  briefly but stopped because it was ineffective and would rather not take medication.  Headaches have improved, however. Intensity:  Moderate to severe Duration:  All day Frequency:  Every 2 days. Current NSAIDS:  ASA '81mg'$  (secondary stroke prevention) Current analgesics:  no Current triptans:  no Current anti-emetic:  no Current muscle relaxants:  no Current anti-anxiolytic:  no Current sleep aide:  no Current Antihypertensive medications:  amlodipine Current Antidepressant medications:  no Current Anticonvulsant medications:  no Current Vitamins/Herbal/Supplements:  no Current Antihistamines/Decongestants:  no Other therapy:  no   Caffeine:  Only to treat headache Alcohol:  Wine rarely Smoker:  no Diet:  Hydrates.  No processed foods.  Vegan Exercise:  Walks daily Depression: no; Anxiety:  no Sleep hygiene:  Good but endorsed excessive daytime sleepiness.  She had a sleep study on 03/19/18 which demonstrated mild OSA and periodic limb movements during sleep.       HISTORY:  Onset:  "all of my life" Location:  Left suboccipital region and bifrontal Quality:  nonthrobbing Initial intensity:  moderate Aura:  no Prodrome:  no Postdrome:  no Associated symptoms:  Photophobia, some phonophobia.  No nausea, vomiting or visual disturbance..  She has not had any new worse headache of her life, waking up from sleep.  She does wake up with headache. Initial Duration:  All day Initial Frequency:  Varies.  May go 2 to 4 weeks with no headache and then have several weeks to months with 3 days of headache weekly. Frequency of abortive medication:  no Triggers/exacerbating factors:  no Relieving factors:  Walking, rubbing nutmeg on temples Activity:  Able to function   Past NSAIDS:  Ibuprofen, Aleve Past analgesics:  Excedrin, Tylenol, Fioricet Past abortive triptans:  no Past muscle relaxants:  no Past anti-emetic:  no Past antihypertensive medications:  no Past antidepressant medications:  no Past anticonvulsant medications:  no Past vitamins/Herbal/Supplements:  turmeric Other past therapies:  no   Family history of headache:  no   She had a left MCA ischemic stroke in September 2015. She takes ASA '81mg'$  daily.  She is on a statin. MRI of brain from 04/19/16 personally reviewed and revealed remote left MCA territory infarct and empty sella.  Carotid doppler from 10/26/17 showed no hemodynamically significant ICA stenosis.  PAST MEDICAL HISTORY: Past Medical History:  Diagnosis Date  . Diabetes mellitus without complication ([place]) 8119   previously on [place]   . Gestational diabetes 2009   . Hypertension 09/07/2014  . Stroke ([place]) 09/07/2014   R side. weakness in upper arm. No deficit in leg. deficit in speech. A littel bit in coordination.     MEDICATIONS: Current Outpatient Medications on File Prior to Visit  Medication Sig Dispense Refill  . amLODipine (NORVASC) 10 MG tablet Take 1 tablet (10 mg total) by mouth daily. 30 tablet 5  . aspirin EC 81 MG tablet Take 1 tablet (81 mg total) by mouth daily. 100 tablet 1  . Blood Glucose Monitoring Suppl (TRUE METRIX METER) w/Device KIT 1 each by Does not apply route as needed. 1 kit 0  . Elastic Bandages & Supports (FINGER SPLINT) MISC 1 each by Does not apply route daily. On  Left thumb and ring finger 2 each 0  . glipiZIDE (GLUCOTROL) 10 MG tablet Take 1 tablet (10 mg total) by mouth 2 (two) times daily before a meal. (Patient not taking: Reported on 04/24/2018) 60 tablet 5  . glucose blood (TRUE METRIX BLOOD GLUCOSE TEST) test strip 1 each by Other route 2 (two) times daily.  100 each 11  . lovastatin (MEVACOR) 40 MG tablet Take 1 tablet (40 mg total) by mouth at bedtime. 30 tablet 6  . nortriptyline (PAMELOR) 25 MG capsule Take 1 capsule (25 mg total) by mouth at bedtime. (Patient not taking: Reported on 05/10/2018) 30 capsule 1  . sitaGLIPtin-metformin (JANUMET) 50-1000 MG tablet Take 1 tablet by mouth 2 (two) times daily with a meal. 60 tablet 5  . TRUEPLUS LANCETS 28G MISC 1 each by Does not apply route 2 (two) times daily. 100 each 11   No current facility-administered medications on file prior to visit.     ALLERGIES: Allergies  Allergen Reactions  . Lipitor [Atorvastatin] Other (See Comments)    Made me feel woozy    FAMILY HISTORY: Family History  Problem Relation Age of Onset  . Diabetes Mother   . Hypertension Mother   . Epilepsy Father   . Hypertension Sister    .  SOCIAL HISTORY: Social History   Socioeconomic History  . Marital status: Married    Spouse name: Not on file  . Number of children: 5  . Years of education: associates  . Highest education level: Not on file  Occupational History  . Occupation: disabled  Social Needs  . Financial resource strain: Not on file  . Food insecurity:    Worry: Not on file    Inability: Not on file  . Transportation needs:    Medical: Not on file    Non-medical: Not on file  Tobacco Use  . Smoking status: Never Smoker  . Smokeless tobacco: Never Used  Substance and Sexual Activity  . Alcohol use: No  . Drug use: No  . Sexual activity: Not on file  Lifestyle  . Physical activity:    Days per week: Not on file    Minutes per session: Not on file  . Stress: Not on file  Relationships  . Social connections:    Talks on phone: Not on file    Gets together: Not on file    Attends religious service: Not on file    Active member of club or organization: Not on file    Attends meetings of clubs or organizations: Not on file    Relationship status: Not on file  . Intimate partner  violence:    Fear of current or ex partner: Not on file    Emotionally abused: Not on file    Physically abused: Not on file    Forced sexual activity: Not on file  Other Topics Concern  . 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    REVIEW OF SYSTEMS: Constitutional: No fevers, chills, or sweats, no generalized fatigue, change in appetite Eyes: No visual changes, double vision, eye pain Ear, nose and throat: No hearing loss, ear pain, nasal congestion, sore throat Cardiovascular: No chest pain, palpitations Respiratory:  No shortness of breath at rest or with exertion, wheezes GastrointestinaI: No nausea, vomiting, diarrhea, abdominal pain, fecal incontinence Genitourinary:  No dysuria, urinary retention or frequency Musculoskeletal:  No neck pain, back  pain Integumentary: No rash, pruritus, skin lesions Neurological: as above Psychiatric: No depression, insomnia, anxiety Endocrine: No palpitations, fatigue, diaphoresis, mood swings, change in appetite, change in weight, increased thirst Hematologic/Lymphatic:  No purpura, petechiae. Allergic/Immunologic: no itchy/runny eyes, nasal congestion, recent allergic reactions, rashes  PHYSICAL EXAM: Vitals:   05/10/18 0916  BP: (!) 144/80  Pulse: 94  SpO2: 97%   General: No acute distress.  Patient appears well-groomed.  Head:  Normocephalic/atraumatic Eyes:  Fundi examined but not visualized Neck: supple, no paraspinal tenderness, full range of motion Heart:  Regular rate and rhythm Lungs:  Clear to auscultation bilaterally Back: No paraspinal tenderness Neurological Exam: Mental status: alert and oriented to person, place, and time, recent and remote memory intact, fund of knowledge intact, attention and concentration intact, speech fluent and not dysarthric, language intact. Cranial nerves:  CN II-XII intact.  Bulk & Tone: spasticity of right  hand, no fasciculations.  Motor:  4+/5 right hand grip/intrinsic hand muscles.  Otherwise, 5/5 throughout  Sensation: light touch sensation intact.  Deep Tendon Reflexes:  3+ right upper extremity, otherwise 2+ throughout.  Finger to nose testing:  Without dysmetria.  Gait:  Normal station and stride.    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 1.  Take magnesium citrate '400mg'$  daily, riboflavin (B2) '400mg'$  daily and coenzyme Q10 '100mg'$  three times daily 2.  Follow sleep hygiene sheet.  If not improved, consider CPAP 3.  Limit use of pain relievers to no more than 2 days out of week. 4.  Keep headache diary 5.  Continue exercise, water intake and diet 6.  Follow up with PCP regarding blood pressure.  7.  Follow up in 3 months.  Metta Clines, DO  CC: Dr. Wynetta Emery

## 2018-05-10 NOTE — Patient Instructions (Addendum)
1.  Take magnesium citrate  daily, riboflavin (B2)  daily and coenzyme Q10  three times daily 2.  Follow sleep hygiene sheet 3.  Limit use of pain relievers to no more than 2 days out of week. 4.  Keep headache diary 5.  Continue exercise, water intake and diet 6.  Follow up with PCP regarding blood pressure.  It is a little high 7.  Follow up in 3 months.

## 2018-05-15 ENCOUNTER — Ambulatory Visit: Payer: PPO | Admitting: Physical Medicine & Rehabilitation

## 2018-05-15 ENCOUNTER — Ambulatory Visit: Payer: PPO | Admitting: Occupational Therapy

## 2018-05-15 NOTE — Therapy (Signed)
[facility] [address] [place], [place], [address] Phone: [phone]   Fax:  [phone]  Patient Details  Name: [name] MRN: [id number] Date of Birth: [birth year] Referring Provider:  Dr. Wynn Banker Encounter Date: 05/15/2018  Pt called on 05/15/18 to cancel all remaining O.T. Appointments secondary to unable to get off from work to attend therapy. Will d/c episode of care at this time.  (Pt was only seen for evaluation and 1 treatment session, therefore did not meet any LTG's at this time)   Kelli Churn, OTR/L 05/15/2018, 1:04 PM  [facility] [address] [place], [place], [address] Phone: [phone]   Fax:  [phone]

## 2018-05-16 NOTE — Telephone Encounter (Signed)
Contacted pt to go over results from sleep study pt states she received results form neurologist and she will talk to her new pcp about a cpap titration

## 2018-05-22 ENCOUNTER — Encounter: Payer: PPO | Admitting: Occupational Therapy

## 2018-05-29 ENCOUNTER — Encounter: Payer: PPO | Admitting: Occupational Therapy

## 2018-06-05 ENCOUNTER — Encounter: Payer: PPO | Admitting: Occupational Therapy

## 2018-06-12 ENCOUNTER — Encounter: Payer: PPO | Admitting: Occupational Therapy

## 2018-06-12 DIAGNOSIS — E785 Hyperlipidemia, unspecified: Secondary | ICD-10-CM | POA: Diagnosis not present

## 2018-06-12 DIAGNOSIS — I10 Essential (primary) hypertension: Secondary | ICD-10-CM | POA: Diagnosis not present

## 2018-06-12 DIAGNOSIS — E1165 Type 2 diabetes mellitus with hyperglycemia: Secondary | ICD-10-CM | POA: Diagnosis not present

## 2018-06-12 DIAGNOSIS — N920 Excessive and frequent menstruation with regular cycle: Secondary | ICD-10-CM | POA: Diagnosis not present

## 2018-06-12 DIAGNOSIS — Z8673 Personal history of transient ischemic attack (TIA), and cerebral infarction without residual deficits: Secondary | ICD-10-CM | POA: Diagnosis not present

## 2018-06-12 DIAGNOSIS — D500 Iron deficiency anemia secondary to blood loss (chronic): Secondary | ICD-10-CM | POA: Diagnosis not present

## 2018-06-12 DIAGNOSIS — Z720 Tobacco use: Secondary | ICD-10-CM | POA: Diagnosis not present

## 2018-06-12 DIAGNOSIS — I119 Hypertensive heart disease without heart failure: Secondary | ICD-10-CM | POA: Diagnosis not present

## 2018-06-19 ENCOUNTER — Encounter: Payer: PPO | Admitting: Occupational Therapy

## 2018-06-19 MED FILL — AMLODIPINE BESYLATE 10 MG T: 10 | 30 days supply | Qty: 30 | Fill #3

## 2018-08-02 ENCOUNTER — Other Ambulatory Visit: Payer: Self-pay | Admitting: *Deleted

## 2018-08-02 NOTE — Patient Outreach (Signed)
[facility]) Care Management  08/02/2018  Jordan SandiferSylvia L Hill 1966-09-22 [id number]  [facility] CM HTA/ HRA Screening Call   PCP: Dr. Julio Sickssei Bonsu  Successful telephone outreach to Jordan MarrowSylvia Hill, for HRA screening; patient has history including but not limited to, DM, HTN, and CVA in 2015.  HIPAA/ identity verified during phone call today, and purpose of call was discussed with patient.  Patient reports she is currently picking up her children/ grandchildren from school. Patient sounds to be in no apparent distress throughout phone call today.   Patient reports today: -- last PCP appointment "earlier this spring;" next scheduled visit "tomorrow" for "routine check up." -- denies need for transportation services- drives self to provider appointments -- independently manages self-care for ADL's and IADL's -- no recent falls; no current use of/ need for DME in home -- Supportive family members, able to assist with care needs as indicated; however, patient reports this is "never needed," and that she considers herself her only "caregiver." -- verbalizes a good understanding of current medications; takes only 3 oral medications and self-manages both without assistance from others; denies concerns with affording medications; denies questions around medications; denies problems around side effects of medications -- reports does not have HCPOA, nor living will; states that she would appreciate additional education/ information around creating same.  Basics of Advanced Directive planning discussed with patient today, and explained that I would place [facility] Social Work referral/ request to mail planning packet and contact her by phone for further discussion; noted that patient had also designated on her HTA questionnaire that she would like assistance with "money management;" when asked about this, patient stated that she "didn't know exactly" what kind of help she was looking for; but stated that she "just  needs help with paying my bills and getting to a better place."  Encouraged patient to be thinking about this, as a [facility] social worker would be contacting in the future about both AD planning and her request for resources around money management- patient agrees to do so.  -- history of depression: reports now seeing therapist; currently denies depression -- history of DM and HTN: patient reports these are well controlled and declines need for additional care support; stated that she maintains regular communication with her PCP, and adds that she plans to request new prescription for glucometer tomorrow during scheduled office visit  Discussed [facility] CM services available to patient through her insurance provider; explained that I would place [facility] CM packet/ welcome letter/ nurse advice line magnet in mail to her; encouraged patient to contact [facility] CM in the future if needs arise, and patient verbalizes agreement.  Confirmed for patient that I would place [facility] CSW referral for Advanced Directive Planning assistance, and encouraged patient to engage with [facility] social work team once call is placed.   Plan:   Will place [facility] CM welcome packet in mail to patient    Will place referral to[facility] SW team re: patient's request for additional information/ education around Advanced Directive planning and resources for money management  Caryl PinaLaine Mckinney Trey Gulbranson, RN, BSN, [employer] Alumnus Community Care Coordinator [facility] Care Management  [phone]

## 2018-08-03 DIAGNOSIS — N920 Excessive and frequent menstruation with regular cycle: Secondary | ICD-10-CM | POA: Diagnosis not present

## 2018-08-03 DIAGNOSIS — Z720 Tobacco use: Secondary | ICD-10-CM | POA: Diagnosis not present

## 2018-08-03 DIAGNOSIS — D500 Iron deficiency anemia secondary to blood loss (chronic): Secondary | ICD-10-CM | POA: Diagnosis not present

## 2018-08-03 DIAGNOSIS — E785 Hyperlipidemia, unspecified: Secondary | ICD-10-CM | POA: Diagnosis not present

## 2018-08-03 DIAGNOSIS — I10 Essential (primary) hypertension: Secondary | ICD-10-CM | POA: Diagnosis not present

## 2018-08-03 DIAGNOSIS — E1165 Type 2 diabetes mellitus with hyperglycemia: Secondary | ICD-10-CM | POA: Diagnosis not present

## 2018-08-03 DIAGNOSIS — I119 Hypertensive heart disease without heart failure: Secondary | ICD-10-CM | POA: Diagnosis not present

## 2018-08-03 DIAGNOSIS — Z8673 Personal history of transient ischemic attack (TIA), and cerebral infarction without residual deficits: Secondary | ICD-10-CM | POA: Diagnosis not present

## 2018-08-07 ENCOUNTER — Other Ambulatory Visit: Payer: Self-pay

## 2018-08-07 NOTE — Patient Outreach (Signed)
[facility]) Care Management  08/07/2018  Jordan SandiferSylvia L Hill 1966-07-28 [id number]  Successful outreach to the patient on today's date, HIPAA identifiers confirmed. BSW introduced self to the patient and the reason for today's call. The patient confirms she is interested in completing an Advanced Directive. The patient denies any questions at this time. The patient declines the need for a home visit. BSW to mail the patient an Advanced Directive packet on today's date. BSW will outreach the patient in the next two weeks to review forms telephonically and assist as needed.  Bevelyn NgoKendra Kenwood Rosiak, BSW, CDP [facility] Care Management Social Worker [phone]

## 2018-08-15 ENCOUNTER — Encounter: Payer: Self-pay | Admitting: Hematology & Oncology

## 2018-08-17 DIAGNOSIS — D500 Iron deficiency anemia secondary to blood loss (chronic): Secondary | ICD-10-CM | POA: Diagnosis not present

## 2018-08-17 DIAGNOSIS — I10 Essential (primary) hypertension: Secondary | ICD-10-CM | POA: Diagnosis not present

## 2018-08-17 DIAGNOSIS — N920 Excessive and frequent menstruation with regular cycle: Secondary | ICD-10-CM | POA: Diagnosis not present

## 2018-08-17 DIAGNOSIS — E785 Hyperlipidemia, unspecified: Secondary | ICD-10-CM | POA: Diagnosis not present

## 2018-08-17 DIAGNOSIS — I119 Hypertensive heart disease without heart failure: Secondary | ICD-10-CM | POA: Diagnosis not present

## 2018-08-17 DIAGNOSIS — E1165 Type 2 diabetes mellitus with hyperglycemia: Secondary | ICD-10-CM | POA: Diagnosis not present

## 2018-08-17 DIAGNOSIS — Z720 Tobacco use: Secondary | ICD-10-CM | POA: Diagnosis not present

## 2018-08-17 DIAGNOSIS — Z8673 Personal history of transient ischemic attack (TIA), and cerebral infarction without residual deficits: Secondary | ICD-10-CM | POA: Diagnosis not present

## 2018-08-22 ENCOUNTER — Other Ambulatory Visit: Payer: Self-pay

## 2018-08-22 NOTE — Patient Outreach (Signed)
[facility]) Care Management  08/22/2018  [name] 03-27-1966 [id number]  Successful call to the patient on today's date, HIPAA identifiers confirmed. The patient is able to confirm she has received the advance directive packet. The patient denies any questions regarding this document and declines the need for social work assistance to complete this document.  BSW to perform a case closure as the patients goals have been met.  Daneen Schick, BSW, CDP [facility] [phone]

## 2018-08-31 NOTE — Progress Notes (Signed)
Patient left without being seen.

## 2018-09-03 ENCOUNTER — Ambulatory Visit (INDEPENDENT_AMBULATORY_CARE_PROVIDER_SITE_OTHER): Payer: PPO | Admitting: Neurology

## 2018-09-03 ENCOUNTER — Encounter: Payer: Self-pay | Admitting: Neurology

## 2018-09-03 VITALS — BP 140/76 | HR 94 | Ht 59.0 in | Wt 184.0 lb

## 2018-09-03 DIAGNOSIS — G44229 Chronic tension-type headache, not intractable: Secondary | ICD-10-CM

## 2018-09-12 ENCOUNTER — Other Ambulatory Visit: Payer: Self-pay | Admitting: Hematology

## 2018-09-12 DIAGNOSIS — D500 Iron deficiency anemia secondary to blood loss (chronic): Secondary | ICD-10-CM | POA: Insufficient documentation

## 2018-09-12 DIAGNOSIS — N920 Excessive and frequent menstruation with regular cycle: Secondary | ICD-10-CM | POA: Insufficient documentation

## 2018-09-13 ENCOUNTER — Other Ambulatory Visit: Payer: Self-pay | Admitting: Hematology

## 2018-09-13 DIAGNOSIS — D500 Iron deficiency anemia secondary to blood loss (chronic): Secondary | ICD-10-CM

## 2018-09-14 ENCOUNTER — Encounter: Payer: Self-pay | Admitting: Hematology

## 2018-09-14 ENCOUNTER — Other Ambulatory Visit: Payer: Self-pay

## 2018-09-14 ENCOUNTER — Inpatient Hospital Stay: Payer: PPO | Attending: Family | Admitting: Hematology

## 2018-09-14 ENCOUNTER — Inpatient Hospital Stay: Payer: PPO

## 2018-09-14 VITALS — BP 162/85 | HR 91 | Temp 98.2°F | Resp 18 | Wt 187.0 lb

## 2018-09-14 DIAGNOSIS — N921 Excessive and frequent menstruation with irregular cycle: Secondary | ICD-10-CM | POA: Diagnosis not present

## 2018-09-14 DIAGNOSIS — IMO0002 Reserved for concepts with insufficient information to code with codable children: Secondary | ICD-10-CM

## 2018-09-14 DIAGNOSIS — E118 Type 2 diabetes mellitus with unspecified complications: Secondary | ICD-10-CM | POA: Diagnosis not present

## 2018-09-14 DIAGNOSIS — E1165 Type 2 diabetes mellitus with hyperglycemia: Secondary | ICD-10-CM | POA: Diagnosis not present

## 2018-09-14 DIAGNOSIS — D500 Iron deficiency anemia secondary to blood loss (chronic): Secondary | ICD-10-CM | POA: Diagnosis not present

## 2018-09-14 DIAGNOSIS — Z8673 Personal history of transient ischemic attack (TIA), and cerebral infarction without residual deficits: Secondary | ICD-10-CM | POA: Diagnosis not present

## 2018-09-14 DIAGNOSIS — D573 Sickle-cell trait: Secondary | ICD-10-CM | POA: Diagnosis not present

## 2018-09-14 DIAGNOSIS — I6300 Cerebral infarction due to thrombosis of unspecified precerebral artery: Secondary | ICD-10-CM

## 2018-09-14 LAB — CMP (CANCER CENTER ONLY)
ALT: 27 U/L (ref 10–47)
ANION GAP: 5 (ref 5–15)
AST: 25 U/L (ref 11–38)
Albumin: 3.6 g/dL (ref 3.5–5.0)
Alkaline Phosphatase: 95 U/L — ABNORMAL HIGH (ref 26–84)
BUN: 6 mg/dL — ABNORMAL LOW (ref 7–22)
CO2: 28 mmol/L (ref 18–33)
Calcium: 9.7 mg/dL (ref 8.0–10.3)
Chloride: 107 mmol/L (ref 98–108)
Creatinine: 0.8 mg/dL (ref 0.60–1.20)
GLUCOSE: 268 mg/dL — AB (ref 73–118)
Potassium: 4.3 mmol/L (ref 3.3–4.7)
Sodium: 140 mmol/L (ref 128–145)
Total Bilirubin: 0.5 mg/dL (ref 0.2–1.6)
Total Protein: 7.3 g/dL (ref 6.4–8.1)

## 2018-09-14 LAB — CBC WITH DIFFERENTIAL (CANCER CENTER ONLY)
BASOS ABS: 0 10*3/uL (ref 0.0–0.1)
Basophils Relative: 1 %
EOS PCT: 2 %
Eosinophils Absolute: 0.1 10*3/uL (ref 0.0–0.5)
HCT: 28.9 % — ABNORMAL LOW (ref 34.8–46.6)
Hemoglobin: 9 g/dL — ABNORMAL LOW (ref 11.6–15.9)
LYMPHS PCT: 29 %
Lymphs Abs: 2.1 10*3/uL (ref 0.9–3.3)
MCH: 19.7 pg — ABNORMAL LOW (ref 26.0–34.0)
MCHC: 31.1 g/dL — ABNORMAL LOW (ref 32.0–36.0)
MCV: 63.2 fL — AB (ref 81.0–101.0)
Monocytes Absolute: 0.5 10*3/uL (ref 0.1–0.9)
Monocytes Relative: 7 %
Neutro Abs: 4.5 10*3/uL (ref 1.5–6.5)
Neutrophils Relative %: 61 %
PLATELETS: 315 10*3/uL (ref 145–400)
RBC: 4.57 MIL/uL (ref 3.70–5.32)
RDW: 19.9 % — ABNORMAL HIGH (ref 11.1–15.7)
WBC Count: 7.3 10*3/uL (ref 3.9–10.0)

## 2018-09-14 LAB — IRON AND TIBC
Iron: 17 ug/dL — ABNORMAL LOW (ref 41–142)
SATURATION RATIOS: 4 % — AB (ref 21–57)
TIBC: 473 ug/dL — ABNORMAL HIGH (ref 236–444)
UIBC: 455 ug/dL

## 2018-09-14 LAB — FERRITIN: Ferritin: <4 ng/mL — ABNORMAL LOW (ref 11–307)

## 2018-09-14 LAB — TECHNOLOGIST SMEAR REVIEW

## 2018-09-14 MED ORDER — FERROUS SULFATE 325 (65 FE) MG PO TABS: 325.0000 mg | ORAL_TABLET | Freq: Every day | ORAL | 0 refills | Status: DC

## 2018-09-14 NOTE — Assessment & Plan Note (Signed)
Patient is noncompliant with glucose intake.  Given her history of stroke and risk factors for cardiovascular disease, I encouraged patient to limit her glucose intake and follow with her PCP closely for diabetes management.

## 2018-09-14 NOTE — Assessment & Plan Note (Addendum)
Patient has history of menorrhagia for many years, which is most likely the source of iron deficiency. She denies any symptoms of GI bleeding, such as hematochezia or melena, but has not had colonoscopy in the past. Iron profile is pending, but review of her labs from March 2019 showed ferritin of 4 with iron saturation of 3%, consistent with severe iron deficiency. Review of peripheral blood smear showed hypochromic microcytic RBCs, consistent with iron deficiency. She does not require transfusion now.  She was prescribed oral iron supplement today.  We discussed some of the risks, benefits, and alternatives of intravenous iron infusions. The patient is symptomatic from anemia and the iron level is critically low; as such, oral supplement is not sufficient to replete iron storage quickly and she will need IV iron to higher levels of iron faster for adequate hematopoesis. Some of the side-effects to be expected including risks of infusion reactions, phlebitis, headaches, nausea and fatigue.  The patient is willing to proceed. Patient education material was dispensed. Goal is to keep ferritin level greater than 50. 1st dose of IV iron scheduled on 09/21/2018.  Given that the patient has not had colonoscopy in the past, she will require colonoscopy to rule out lower GI bleeding and as part of cancer surveillance based on her age.  Therefore, I have referred her to gastroenterology.

## 2018-09-14 NOTE — Assessment & Plan Note (Addendum)
Hgb electrophoresis confirmed sickle cell trait. There is no concurrent thalassemia.

## 2018-09-14 NOTE — Assessment & Plan Note (Signed)
Patient reports many years of menorrhalgia after C-section, but has not had any menstrual cycle over the past 3 months. She is followed closely by her gynecologist for the management of menorrhagia.

## 2018-09-14 NOTE — Progress Notes (Addendum)
[name] CONSULT NOTE  Patient Care Team: Ladell Pier, MD as PCP - General (Internal Medicine)  ASSESSMENT & PLAN:  Iron deficiency anemia due to chronic blood loss Patient has history of menorrhagia for many years, which is most likely the source of iron deficiency. She denies any symptoms of GI bleeding, such as hematochezia or melena, but has not had colonoscopy in the past. Iron profile is pending, but review of her labs from March 2019 showed ferritin of 4 with iron saturation of 3%, consistent with severe iron deficiency. Review of peripheral blood smear showed hypochromic microcytic RBCs, consistent with iron deficiency. She does not require transfusion now.  She was prescribed oral iron supplement today.  We discussed some of the risks, benefits, and alternatives of intravenous iron infusions. The patient is symptomatic from anemia and the iron level is critically low; as such, oral supplement is not sufficient to replete iron storage quickly and she will need IV iron to higher levels of iron faster for adequate hematopoesis. Some of the side-effects to be expected including risks of infusion reactions, phlebitis, headaches, nausea and fatigue.  The patient is willing to proceed. Patient education material was dispensed. Goal is to keep ferritin level greater than 50. 1st dose of IV iron scheduled on 09/21/2018.  Given that the patient has not had colonoscopy in the past, she will require colonoscopy to rule out lower GI bleeding and as part of cancer surveillance based on her age.  Therefore, I have referred her to gastroenterology.  Diabetes mellitus type 2, uncontrolled, with complications ([place]) Patient is noncompliant with glucose intake.  Given her history of stroke and risk factors for cardiovascular disease, I encouraged patient to limit her glucose intake and follow with her PCP closely for diabetes management.  Menorrhagia Patient reports many years of  menorrhalgia after C-section, but has not had any menstrual cycle over the past 3 months. She is followed closely by her gynecologist for the management of menorrhagia.  Sickle cell trait (HCC) Hgb electrophoresis confirmed sickle cell trait. There is no concurrent thalassemia.    Orders Placed This Encounter  Procedures  . Hemoglobinopathy evaluation    Standing Status:   Future    Number of Occurrences:   1    Standing Expiration Date:   09/14/2019  . CBC with Differential (Cancer Center Only)    Standing Status:   Future    Standing Expiration Date:   10/19/2019  . CMP ([facility] only)    Standing Status:   Future    Standing Expiration Date:   10/19/2019  . Ferritin    Standing Status:   Future    Standing Expiration Date:   10/19/2019  . Iron and TIBC    Standing Status:   Future    Standing Expiration Date:   10/19/2019  . Vitamin B12    Standing Status:   Future    Standing Expiration Date:   10/19/2019  . Folate    Standing Status:   Future    Standing Expiration Date:   10/19/2019  . Ambulatory referral to Gastroenterology    Referral Priority:   Routine    Referral Type:   Consultation    Referral Reason:   Specialty Services Required    Number of Visits Requested:   1    A total of more than 45 minutes were spent face-to-face with the patient during this encounter and over half of that time was spent on counseling and  coordination of care as outlined above.    All questions were answered. The patient knows to call the clinic with any problems, questions or concerns.  Return to clinic in 2 months for labs (including ferritin) and follow-up.   Tish Men, MD 09/18/2018 5:36 PM   CHIEF COMPLAINTS/PURPOSE OF CONSULTATION:  "I am here for anemia"  HISTORY OF PRESENTING ILLNESS:  [name] 52 y.o. female is here because of iron deficiency anemia.  She reports many years of menorrhagia that started after her C-section and tubal ligation.  Her menstrual  cycle usually lasts 6 to 7 days and she has to change at least 4-5 pads per day.  She reports fatigue and chewing on ice, but denies any symptoms of bleeding, such as hematochezia, melena, hematuria, or epistaxis.  She has not had any blood transfusion or IV iron in the past.  She has been monitored closely by her gynecologist for menorrhagia, and OCPs and IUDs were discussed in the past to help alleviate menorrhagia.  However, her menstrual cycle has ceased for the past 22-monthwithout any intervention, and she began to experience some hot flashes, consistent with menopausal symptoms.  I have reviewed her chart and materials related to her cancer extensively and collaborated history with the patient. Summary of oncologic history is as follows:  No history exists.    MEDICAL HISTORY:  Past Medical History:  Diagnosis Date  . Diabetes mellitus without complication ([facility] [address]  previously on vShields  . Gestational diabetes 2009   . Hypertension 09/07/2014  . Stroke ([facility] 09/07/2014   R side. weakness in upper arm. No deficit in leg. deficit in speech. A littel bit in coordination.     SURGICAL HISTORY: Past Surgical History:  Procedure Laterality Date  . BREAST SURGERY    . CESAREAN SECTION    . TUBAL LIGATION  2009    SOCIAL HISTORY: Social History   Socioeconomic History  . Marital status: Married    Spouse name: Not on file  . Number of children: 5  . Years of education: associates  . Highest education level: Not on file  Occupational History  . Occupation: disabled  Social Needs  . Financial resource strain: Not on file  . Food insecurity:    Worry: Not on file    Inability: Not on file  . Transportation needs:    Medical: Not on file    Non-medical: Not on file  Tobacco Use  . Smoking status: Never Smoker  . Smokeless tobacco: Never Used  Substance and Sexual Activity  . Alcohol use: No  . Drug use: No  . Sexual activity: Not on file  Lifestyle  . Physical  activity:    Days per week: Not on file    Minutes per session: Not on file  . Stress: Not on file  Relationships  . Social connections:    Talks on phone: Not on file    Gets together: Not on file    Attends religious service: Not on file    Active member of club or organization: Not on file    Attends meetings of clubs or organizations: Not on file    Relationship status: Not on file  . Intimate partner violence:    Fear of current or ex partner: Not on file    Emotionally abused: Not on file    Physically abused: Not on file    Forced sexual activity: Not on file  Other Topics Concern  .  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    FAMILY HISTORY: Family History  Problem Relation Age of Onset  . Diabetes Mother   . Hypertension Mother   . Epilepsy Father   . Hypertension Sister     ALLERGIES:  is allergic to lipitor [atorvastatin].  MEDICATIONS:  Current Outpatient Medications  Medication Sig Dispense Refill  . amLODipine (NORVASC) 10 MG tablet Take 1 tablet (10 mg total) by mouth daily. 30 tablet 5  . aspirin EC 81 MG tablet Take 1 tablet (81 mg total) by mouth daily. 100 tablet 1  . Blood Glucose Monitoring Suppl (TRUE METRIX METER) w/Device KIT 1 each by Does not apply route as needed. 1 kit 0  . Elastic Bandages & Supports (FINGER SPLINT) MISC 1 each by Does not apply route daily. On Left thumb and ring finger 2 each 0  . ferrous sulfate (FERROUSUL) 325 (65 FE) MG tablet Take 1 tablet (325 mg total) by mouth daily with breakfast. 90 tablet 0  . glipiZIDE (GLUCOTROL) 10 MG tablet Take 1 tablet (10 mg total) by mouth 2 (two) times daily before a meal. (Patient not taking: Reported on 04/24/2018) 60 tablet 5  . glucose blood (TRUE METRIX BLOOD GLUCOSE TEST) test strip 1 each by Other route 2 (two) times daily. 100 each 11  . lovastatin (MEVACOR) 40 MG tablet Take 1 tablet (40 mg total)  by mouth at bedtime. 30 tablet 6  . nortriptyline (PAMELOR) 25 MG capsule Take 1 capsule (25 mg total) by mouth at bedtime. (Patient not taking: Reported on 05/10/2018) 30 capsule 1  . sitaGLIPtin-metformin (JANUMET) 50-1000 MG tablet Take 1 tablet by mouth 2 (two) times daily with a meal. 60 tablet 5  . TRUEPLUS LANCETS 28G MISC 1 each by Does not apply route 2 (two) times daily. 100 each 11   No current facility-administered medications for this visit.     REVIEW OF SYSTEMS:   Constitutional: ( - ) fevers, ( - )  chills , ( - ) night sweats Eyes: ( - ) blurriness of vision, ( - ) double vision, ( - ) watery eyes Ears, nose, mouth, throat, and face: ( - ) mucositis, ( - ) sore throat Respiratory: ( - ) cough, ( - ) dyspnea, ( - ) wheezes Cardiovascular: ( - ) palpitation, ( - ) chest discomfort, ( - ) lower extremity swelling Gastrointestinal:  ( - ) nausea, ( - ) heartburn, ( - ) change in bowel habits Skin: ( - ) abnormal skin rashes Lymphatics: ( - ) new lymphadenopathy, ( - ) easy bruising Neurological: ( - ) numbness, ( - ) tingling, ( + ) chronic R hand weakness and contracture  Behavioral/Psych: ( - ) mood change, ( - ) new changes  All other systems were reviewed with the patient and are negative.  PHYSICAL EXAMINATION: ECOG PERFORMANCE STATUS: 1 - Symptomatic but completely ambulatory  Vitals:   09/14/18 1030  BP: (!) 162/85  Pulse: 91  Resp: 18  Temp: 98.2 F (36.8 C)  SpO2: 100%   Filed Weights   09/14/18 1030  Weight: 187 lb (84.8 kg)    GENERAL: alert, no distress and comfortable SKIN: skin color, texture, turgor are normal, no rashes or significant lesions EYES: conjunctiva are pink and non-injected, sclera clear OROPHARYNX: no exudate, no erythema; lips, buccal mucosa, and tongue normal  NECK: supple, non-tender LYMPH:  no palpable lymphadenopathy in the cervical or axillary region  LUNGS: clear to auscultation and percussion with normal breathing  effort HEART: regular rate & rhythm and no murmurs and no lower extremity edema ABDOMEN: soft, non-tender, non-distended, normal bowel sounds Musculoskeletal: no cyanosis of digits and no clubbing  PSYCH: alert & oriented x 3, speech normal,  NEURO: 4-/5 strength in the right hand, strength otherwise normal and normal sensation throughout  LABORATORY DATA:  I have reviewed the data as listed Lab Results  Component Value Date   WBC 7.3 09/14/2018   HGB 9.0 (L) 09/14/2018   HCT 28.9 (L) 09/14/2018   MCV 63.2 (L) 09/14/2018   PLT 315 09/14/2018   Lab Results  Component Value Date   NA 140 09/14/2018   K 4.3 09/14/2018   CL 107 09/14/2018   CO2 28 09/14/2018   I personally reviewed the patient's peripheral blood smear today.  There was no peripheral blast.  The white blood cells were of normal morphology. The red blood cells were hypochromic and microcytic. There was no schistocytosis.  The platelets are of normal size and I have verified that there were no platelet clumping.

## 2018-09-18 LAB — HEMOGLOBINOPATHY EVALUATION
HGB C: 0 %
Hgb A2 Quant: 2.2 % (ref 1.8–3.2)
Hgb A: 63.8 % — ABNORMAL LOW (ref 96.4–98.8)
Hgb F Quant: 0 % (ref 0.0–2.0)
Hgb S Quant: 34 % — ABNORMAL HIGH
Hgb Variant: 0 %

## 2018-09-19 ENCOUNTER — Encounter: Payer: Self-pay | Admitting: Gastroenterology

## 2018-09-21 ENCOUNTER — Other Ambulatory Visit: Payer: Self-pay

## 2018-09-21 ENCOUNTER — Inpatient Hospital Stay: Payer: PPO | Attending: Hematology & Oncology

## 2018-09-21 VITALS — BP 138/71 | HR 78 | Temp 98.5°F | Resp 18

## 2018-09-21 DIAGNOSIS — D500 Iron deficiency anemia secondary to blood loss (chronic): Secondary | ICD-10-CM | POA: Insufficient documentation

## 2018-09-21 DIAGNOSIS — K921 Melena: Secondary | ICD-10-CM | POA: Diagnosis not present

## 2018-09-21 MED ORDER — SODIUM CHLORIDE 0.9 % IV SOLN
Freq: Once | INTRAVENOUS | Status: AC
  Administered 2018-09-21: 09:00:00 via INTRAVENOUS
  Filled 2018-09-21: qty 250

## 2018-09-21 MED ORDER — SODIUM CHLORIDE 0.9 % IV SOLN
510.0000 mg | Freq: Once | INTRAVENOUS | Status: AC
  Administered 2018-09-21: 510 mg via INTRAVENOUS
  Filled 2018-09-21: qty 17

## 2018-09-21 NOTE — Patient Instructions (Signed)

## 2018-09-28 ENCOUNTER — Ambulatory Visit: Payer: PPO

## 2018-10-01 ENCOUNTER — Ambulatory Visit: Payer: PPO | Admitting: Gastroenterology

## 2018-10-22 ENCOUNTER — Ambulatory Visit (INDEPENDENT_AMBULATORY_CARE_PROVIDER_SITE_OTHER): Payer: PPO | Admitting: Gastroenterology

## 2018-10-22 ENCOUNTER — Other Ambulatory Visit (INDEPENDENT_AMBULATORY_CARE_PROVIDER_SITE_OTHER): Payer: PPO

## 2018-10-22 ENCOUNTER — Encounter: Payer: Self-pay | Admitting: Gastroenterology

## 2018-10-22 VITALS — BP 154/88 | HR 68 | Ht 59.0 in | Wt 187.0 lb

## 2018-10-22 DIAGNOSIS — D509 Iron deficiency anemia, unspecified: Secondary | ICD-10-CM

## 2018-10-22 DIAGNOSIS — Z1211 Encounter for screening for malignant neoplasm of colon: Secondary | ICD-10-CM

## 2018-10-22 LAB — CBC WITH DIFFERENTIAL/PLATELET
BASOS PCT: 0.9 % (ref 0.0–3.0)
Basophils Absolute: 0.1 10*3/uL (ref 0.0–0.1)
EOS ABS: 0.1 10*3/uL (ref 0.0–0.7)
EOS PCT: 1.4 % (ref 0.0–5.0)
HCT: 37.9 % (ref 36.0–46.0)
Hemoglobin: 12.1 g/dL (ref 12.0–15.0)
Lymphocytes Relative: 29.5 % (ref 12.0–46.0)
Lymphs Abs: 2.4 10*3/uL (ref 0.7–4.0)
MCHC: 32 g/dL (ref 30.0–36.0)
MCV: 73.2 fl — ABNORMAL LOW (ref 78.0–100.0)
MONO ABS: 0.4 10*3/uL (ref 0.1–1.0)
Monocytes Relative: 4.5 % (ref 3.0–12.0)
NEUTROS PCT: 63.7 % (ref 43.0–77.0)
Neutro Abs: 5.1 10*3/uL (ref 1.4–7.7)
Platelets: 264 10*3/uL (ref 150.0–400.0)
RBC: 5.17 Mil/uL — ABNORMAL HIGH (ref 3.87–5.11)
RDW: 31 % — AB (ref 11.5–15.5)
WBC: 8.1 10*3/uL (ref 4.0–10.5)

## 2018-10-22 MED ORDER — SUPREP BOWEL PREP KIT 17.5-3.13-1.6 GM/177ML PO SOLN: 1.0000 | ORAL | 0 refills | Status: DC

## 2018-10-22 NOTE — Progress Notes (Signed)
  Chief Complaint: Iron deficiency anemia  Referring Provider:  Zhao, Yan, MD      ASSESSMENT AND PLAN;   #1.  Iron def anemia (H/O menorrhagia in past, sickel cell triat)- R/O GI etiology.  Heme negative stools. S/P IV iron 09/2018  #2.  Colorectal cancer screening  Plan: - Proceed with colonoscopy.  I have discussed the risks and benefits.  The risks including risk of perforation requiring laparotomy, bleeding after polypectomy requiring blood transfusions and risks of anesthesia/sedation were discussed.  Rare risks of missing colorectal neoplasms were also discussed.  Alternatives were given.  Patient is fully aware and agrees to proceed. All the questions were answered. Colonoscopy will be scheduled in upcoming days.  Patient is to report immediately if there is any significant weight loss or excessive bleeding until then. Consent forms were given for review. - Check CBC.   HPI:    [name] is a 52 y.o. female  With iron deficiency anemia History of heavy menstrual cycles which have been much better lately.  She had not had any menstrual cycles over the past 3 months. Status post IV iron 09/2018 She feels significantly better No more craving of ice. No nausea, vomiting, heartburn, regurgitation, odynophagia or dysphagia.  No significant diarrhea or constipation.  There is no melena or hematochezia. No unintentional weight loss. No family history of colon cancer Had heme-negative stools Stool has been dark since she has been on iron p.o.   Past Medical History:  Diagnosis Date  . Diabetes mellitus without complication (HCC) 2013   previously on victoza   . Gestational diabetes 2009   . Hypertension 09/07/2014  . Stroke ([facility]) 09/07/2014   R side. weakness in upper arm. No deficit in leg. deficit in speech. A littel bit in coordination.     Past Surgical History:  Procedure Laterality Date  . BREAST SURGERY    . CESAREAN SECTION    . TUBAL LIGATION  2009     Family History  Problem Relation Age of Onset  . Diabetes Mother   . Hypertension Mother   . Epilepsy Father   . Hypertension Sister   . Colon cancer Neg Hx     Social History   Tobacco Use  . Smoking status: Never Smoker  . Smokeless tobacco: Never Used  Substance Use Topics  . Alcohol use: No  . Drug use: Yes    Types: Marijuana    Current Outpatient Medications  Medication Sig Dispense Refill  . amLODipine (NORVASC) 10 MG tablet Take 1 tablet (10 mg total) by mouth daily. 30 tablet 5  . aspirin EC 81 MG tablet Take 1 tablet (81 mg total) by mouth daily. 100 tablet 1  . Blood Glucose Monitoring Suppl (TRUE METRIX METER) w/Device KIT 1 each by Does not apply route as needed. 1 kit 0  . Elastic Bandages & Supports (FINGER SPLINT) MISC 1 each by Does not apply route daily. On Left thumb and ring finger 2 each 0  . ferrous sulfate (FERROUSUL) 325 (65 FE) MG tablet Take 1 tablet (325 mg total) by mouth daily with breakfast. 90 tablet 0  . glipiZIDE (GLUCOTROL) 10 MG tablet Take 1 tablet (10 mg total) by mouth 2 (two) times daily before a meal. 60 tablet 5  . glucose blood (TRUE METRIX BLOOD GLUCOSE TEST) test strip 1 each by Other route 2 (two) times daily. 100 each 11  . lovastatin (MEVACOR) 40 MG tablet Take 1 tablet (40 mg total)   by mouth at bedtime. 30 tablet 6  . nortriptyline (PAMELOR) 25 MG capsule Take 1 capsule (25 mg total) by mouth at bedtime. 30 capsule 1  . sitaGLIPtin-metformin (JANUMET) 50-1000 MG tablet Take 1 tablet by mouth 2 (two) times daily with a meal. 60 tablet 5  . TRUEPLUS LANCETS 28G MISC 1 each by Does not apply route 2 (two) times daily. 100 each 11   No current facility-administered medications for this visit.     Allergies  Allergen Reactions  . Lipitor [Atorvastatin] Other (See Comments)    Made me feel woozy    Review of Systems:  Constitutional: Denies fever, chills, diaphoresis, appetite change and fatigue.  HEENT: Denies photophobia,  eye pain, redness, hearing loss, ear pain, congestion, sore throat, rhinorrhea, sneezing, mouth sores, neck pain, neck stiffness and tinnitus.   Respiratory: Denies SOB, DOE, cough, chest tightness,  and wheezing.   Cardiovascular: Denies chest pain, palpitations and leg swelling.  Genitourinary: Denies dysuria, urgency, frequency, hematuria, flank pain and difficulty urinating.  Musculoskeletal: Denies myalgias, back pain, joint swelling, arthralgias and gait problem.  Skin: No rash.  Neurological: Denies dizziness, seizures, syncope, weakness, light-headedness, numbness and headaches.  Hematological: Denies adenopathy. Easy bruising, personal or family bleeding history  Psychiatric/Behavioral: has anxiety or depression     Physical Exam:    BP (!) 154/88   Pulse 68   Ht 4' 11" (1.499 m)   Wt 187 lb (84.8 kg)   BMI 37.77 kg/m  Filed Weights   10/22/18 0847  Weight: 187 lb (84.8 kg)   Constitutional:  Well-developed, in no acute distress. Psychiatric: Normal mood and affect. Behavior is normal. HEENT: Pupils normal.  Conjunctivae are normal. No scleral icterus. Neck supple.  Cardiovascular: Normal rate, regular rhythm. No edema Pulmonary/chest: Effort normal and breath sounds normal. No wheezing, rales or rhonchi. Abdominal: Soft, nondistended. Nontender. Bowel sounds active throughout. There are no masses palpable. No hepatomegaly. Rectal:  defered Neurological: Alert and oriented to person place and time. Skin: Skin is warm and dry. No rashes noted.  Data Reviewed: I have personally reviewed following labs and imaging studies  CBC: CBC Latest Ref Rng & Units 09/14/2018 09/26/2017 04/19/2016  WBC 3.9 - 10.0 K/uL 7.3 7.4 8.0  Hemoglobin 11.6 - 15.9 g/dL 9.0(L) 8.4(L) 8.8(L)  Hematocrit 34.8 - 46.6 % 28.9(L) 28.3(L) 27.8(L)  Platelets 145 - 400 K/uL 315 437(H) 403(H)    CMP: CMP Latest Ref Rng & Units 09/14/2018 03/02/2017 04/19/2016  Glucose 73 - 118 mg/dL 268(H) 189(H) 213(H)   BUN 7 - 22 mg/dL 6(L) 7 9  Creatinine 0.60 - 1.20 mg/dL 0.80 0.62 0.65  Sodium 128 - 145 mmol/L 140 136 136  Potassium 3.3 - 4.7 mmol/L 4.3 4.3 3.8  Chloride 98 - 108 mmol/L 107 102 103  CO2 18 - 33 mmol/L 28 26 25  Calcium 8.0 - 10.3 mg/dL 9.7 9.4 9.6  Total Protein 6.4 - 8.1 g/dL 7.3 7.1 7.6  Total Bilirubin 0.2 - 1.6 mg/dL 0.5 0.4 0.3  Alkaline Phos 26 - 84 U/L 95(H) 80 67  AST 11 - 38 U/L 25 22 23  ALT 10 - 47 U/L 27 12 15      Raj Gupta, MD 10/22/2018, 9:15 AM  Cc: Zhao, Yan, MD   

## 2018-10-22 NOTE — Patient Instructions (Signed)
If you are age 50 or older, your body mass index should be between 23-30. Your Body mass index is 37.77 kg/m. If this is out of the aforementioned range listed, please consider follow up with your Primary Care Provider.  If you are age 13 or younger, your body mass index should be between 19-25. Your Body mass index is 37.77 kg/m. If this is out of the aformentioned range listed, please consider follow up with your Primary Care Provider.   You have been scheduled for a colonoscopy. Please follow written instructions given to you at your visit today.  Please pick up your prep supplies at the pharmacy within the next 1-3 days. If you use inhalers (even only as needed), please bring them with you on the day of your procedure. Your physician has requested that you go to www.startemmi.com and enter the access code given to you at your visit today. This web site gives a general overview about your procedure. However, you should still follow specific instructions given to you by our office regarding your preparation for the procedure.  We have sent the following medications to your pharmacy for you to pick up at your convenience: Suprep  Hold Iron tablets 7 days before colonoscopy  Please go to the lab on the 2nd floor [address] before you leave the office today.   Thank you,  Dr. Lynann Bologna

## 2018-10-26 ENCOUNTER — Inpatient Hospital Stay: Payer: PPO

## 2018-11-01 ENCOUNTER — Other Ambulatory Visit: Payer: Self-pay | Admitting: Family

## 2018-11-01 ENCOUNTER — Other Ambulatory Visit: Payer: Self-pay

## 2018-11-01 ENCOUNTER — Inpatient Hospital Stay: Payer: PPO | Attending: Hematology & Oncology

## 2018-11-01 VITALS — BP 138/69 | HR 93 | Temp 97.9°F | Resp 18

## 2018-11-01 DIAGNOSIS — D500 Iron deficiency anemia secondary to blood loss (chronic): Secondary | ICD-10-CM | POA: Insufficient documentation

## 2018-11-01 DIAGNOSIS — R110 Nausea: Secondary | ICD-10-CM

## 2018-11-01 DIAGNOSIS — Z79899 Other long term (current) drug therapy: Secondary | ICD-10-CM | POA: Diagnosis not present

## 2018-11-01 DIAGNOSIS — N920 Excessive and frequent menstruation with regular cycle: Secondary | ICD-10-CM | POA: Insufficient documentation

## 2018-11-01 DIAGNOSIS — D573 Sickle-cell trait: Secondary | ICD-10-CM | POA: Diagnosis not present

## 2018-11-01 MED ORDER — SODIUM CHLORIDE 0.9 % IV SOLN: 40.0000 mg | Freq: Once | INTRAVENOUS | Status: DC

## 2018-11-01 MED ORDER — METHYLPREDNISOLONE SODIUM SUCC 125 MG IJ SOLR
80.0000 mg | Freq: Once | INTRAMUSCULAR | Status: AC
  Administered 2018-11-01: 80 mg via INTRAVENOUS

## 2018-11-01 MED ORDER — SODIUM CHLORIDE 0.9 % IV SOLN
510.0000 mg | Freq: Once | INTRAVENOUS | Status: AC
  Administered 2018-11-01: 510 mg via INTRAVENOUS
  Filled 2018-11-01: qty 17

## 2018-11-01 MED ORDER — SODIUM CHLORIDE 0.9 % IV SOLN
Freq: Once | INTRAVENOUS | Status: AC
  Administered 2018-11-01: 08:00:00 via INTRAVENOUS
  Filled 2018-11-01: qty 250

## 2018-11-01 MED ORDER — FAMOTIDINE IN NACL 20-0.9 MG/50ML-% IV SOLN: 20.0000 mg | Freq: Once | INTRAVENOUS | Status: DC

## 2018-11-01 MED ORDER — FAMOTIDINE IN NACL 20-0.9 MG/50ML-% IV SOLN
40.0000 mg | Freq: Two times a day (BID) | INTRAVENOUS | Status: DC
  Administered 2018-11-01 (×2): 40 mg via INTRAVENOUS

## 2018-11-01 NOTE — Patient Instructions (Signed)

## 2018-11-01 NOTE — Progress Notes (Signed)
Patient complains of intense nausea and abdominal cramping. Patient face flushed. Feraheme infusion stopped, Normal saline wide open. Dr. Myna HidalgoEnnever notified.    Dr. Myna HidalgoEnnever bedside. Verbal order given and carried out for solu-medrol 80 mg IV push and 40 mg Famotidine IVPB.   [id number] Patient reports stomach cramping and nausea resolving.   0900 Patient states abdominal cramping completely resolved.   0935 Vital signs stable. Proceed with remainder of feraheme infusion per Dr. Myna HidalgoEnnever.

## 2018-11-08 ENCOUNTER — Encounter: Payer: Self-pay | Admitting: Gastroenterology

## 2018-11-13 NOTE — Progress Notes (Signed)
[facility] OFFICE PROGRESS NOTE  Patient Care Team: Benito Mccreedy, MD as PCP - General (Internal Medicine)  HEME/ONC OVERVIEW: 1. 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 Sickle cell trait  ACTIVE TREATMENTS: IV iron infusion PRN, last in mid-10/2018  ASSESSMENT & PLAN:  IDA secondary to menorrhagia  -Patient tolerated IV iron infusion well without significant infusion reactions in 08/2018 -Hemoglobin improved to 12.1 in early 10/2018; patient reports symptomatic improvement, including increased energy -Iron profile pending today  -Given the normalization of hemoglobin, there is no indication for IV iron infusion today -She has tolerated oral iron supplement without any side effect; I recommend the patient to continue PO iron daily to q2days  -She was referred to GI for screening colonoscopy, given that she has not had one in the past; colonoscopy scheduled on 11/22/2018  Menorrhagia -Heavy menstrual cycle essentially has resolved over the past few months -Patient is followed by her gynecology  -I will defer the management to her gynecologist  Sickle cell trait -Hgb S 34%, c/w with [place] trait -Patient is asymptomatic   Age-appropriate cancer screening -I counseled the patient on importance of colonoscopy for both cancer screening and to rule out for any source of lower GI bleeding  Orders Placed This Encounter  Procedures  . CBC with Differential (Cancer Center Only)    Standing Status:   Future    Standing Expiration Date:   12/21/2019  . Iron and TIBC    Standing Status:   Future    Standing Expiration Date:   12/21/2019  . Ferritin    Standing Status:   Future    Standing Expiration Date:   12/21/2019   All questions were answered. The patient knows to call the clinic with any problems, questions or concerns. No barriers to learning was detected.  A total of more than 15 minutes were spent face-to-face with the patient during this encounter and over half of that  time was spent on counseling and coordination of care as outlined above.   Tish Men, MD 11/16/2018 10:19 AM  CHIEF COMPLAINT: "I am feeling much better"  INTERVAL HISTORY: Ms. shades returns to clinic for follow-up of iron deficiency anemia secondary to menorrhagia.  Patient reports that over the past few months, her menorrhagia has essentially resolved, and her most recent menstrual period a month ago was very light.  She is followed by gynecologist closely.  She reports that her craving for iron has resolved.  She was started on oral iron supplement at last visit, which she has tolerated well and denies any side effects such as constipation or GI upset.  SUMMARY OF ONCOLOGIC HISTORY:  No history exists.    REVIEW OF SYSTEMS:   Constitutional: ( - ) fevers, ( - )  chills , ( - ) night sweats Eyes: ( - ) blurriness of vision, ( - ) double vision, ( - ) watery eyes Ears, nose, mouth, throat, and face: ( - ) mucositis, ( - ) sore throat Respiratory: ( - ) cough, ( - ) dyspnea, ( - ) wheezes Cardiovascular: ( - ) palpitation, ( - ) chest discomfort, ( - ) lower extremity swelling Gastrointestinal:  ( - ) nausea, ( - ) heartburn, ( - ) change in bowel habits Skin: ( - ) abnormal skin rashes Lymphatics: ( - ) new lymphadenopathy, ( - ) easy bruising Neurological: ( - ) numbness, ( - ) tingling, ( - ) new weaknesses Behavioral/Psych: ( - ) mood change, ( - )  new changes  All other systems were reviewed with the patient and are negative.  I have reviewed the past medical history, past surgical history, social history and family history with the patient and they are unchanged from previous note.  ALLERGIES:  is allergic to lipitor [atorvastatin].  MEDICATIONS:  Current Outpatient Medications  Medication Sig Dispense Refill  . amLODipine (NORVASC) 10 MG tablet Take 1 tablet (10 mg total) by mouth daily. 30 tablet 5  . aspirin EC 81 MG tablet Take 1 tablet (81 mg total) by mouth daily. 100  tablet 1  . Blood Glucose Monitoring Suppl (TRUE METRIX METER) w/Device KIT 1 each by Does not apply route as needed. 1 kit 0  . Elastic Bandages & Supports (FINGER SPLINT) MISC 1 each by Does not apply route daily. On Left thumb and ring finger 2 each 0  . ferrous sulfate (FERROUSUL) 325 (65 FE) MG tablet Take 1 tablet (325 mg total) by mouth daily with breakfast. 90 tablet 0  . glipiZIDE (GLUCOTROL) 10 MG tablet Take 1 tablet (10 mg total) by mouth 2 (two) times daily before a meal. 60 tablet 5  . glucose blood (TRUE METRIX BLOOD GLUCOSE TEST) test strip 1 each by Other route 2 (two) times daily. 100 each 11  . lovastatin (MEVACOR) 40 MG tablet Take 1 tablet (40 mg total) by mouth at bedtime. 30 tablet 6  . nortriptyline (PAMELOR) 25 MG capsule Take 1 capsule (25 mg total) by mouth at bedtime. 30 capsule 1  . sitaGLIPtin-metformin (JANUMET) 50-1000 MG tablet Take 1 tablet by mouth 2 (two) times daily with a meal. 60 tablet 5  . SUPREP BOWEL PREP KIT 17.5-3.13-1.6 GM/177ML SOLN Take 1 kit by mouth as directed. 354 mL 0  . TRUEPLUS LANCETS 28G MISC 1 each by Does not apply route 2 (two) times daily. 100 each 11   No current facility-administered medications for this visit.     PHYSICAL EXAMINATION: ECOG PERFORMANCE STATUS: 0 - Asymptomatic  Today's Vitals   11/16/18 1000  BP: (!) 135/53  Pulse: 86  Temp: 98.6 F (37 C)  TempSrc: Oral  SpO2: 100%  Weight: 190 lb 1.6 oz (86.2 kg)  PainSc: 0-No pain   Body mass index is 38.4 kg/m.  Filed Weights   11/16/18 1000  Weight: 190 lb 1.6 oz (86.2 kg)    GENERAL: alert, no distress and comfortable SKIN: skin color, texture, turgor are normal, no rashes or significant lesions EYES: conjunctiva are pink and non-injected, sclera clear OROPHARYNX: no exudate, no erythema; lips, buccal mucosa, and tongue normal  NECK: supple, non-tender LUNGS: clear to auscultation and percussion with normal breathing effort HEART: regular rate & rhythm and  no murmurs and no lower extremity edema ABDOMEN: soft, non-tender, non-distended, normal bowel sounds Musculoskeletal: no cyanosis of digits and no clubbing  PSYCH: alert & oriented x 3, speech normal NEURO: 4-/5 strength in the right hand, strength otherwise normal and normal sensation throughout  LABORATORY DATA:  I have reviewed the data as listed    Component Value Date/Time   NA 140 09/14/2018 1000   K 4.3 09/14/2018 1000   CL 107 09/14/2018 1000   CO2 28 09/14/2018 1000   GLUCOSE 268 (H) 09/14/2018 1000   BUN 6 (L) 09/14/2018 1000   CREATININE 0.80 09/14/2018 1000   CREATININE 0.62 03/02/2017 0924   CALCIUM 9.7 09/14/2018 1000   PROT 7.3 09/14/2018 1000   ALBUMIN 3.6 09/14/2018 1000   AST 25 09/14/2018 1000  ALT 27 09/14/2018 1000   ALKPHOS 95 (H) 09/14/2018 1000   BILITOT 0.5 09/14/2018 1000   GFRNONAA >89 03/02/2017 0924   GFRAA >89 03/02/2017 0924    No results found for: SPEP, UPEP  Lab Results  Component Value Date   WBC 8.7 11/16/2018   NEUTROABS 5.9 11/16/2018   HGB 12.5 11/16/2018   HCT 39.0 11/16/2018   MCV 76.2 (L) 11/16/2018   PLT 234 11/16/2018      Chemistry      Component Value Date/Time   NA 140 09/14/2018 1000   K 4.3 09/14/2018 1000   CL 107 09/14/2018 1000   CO2 28 09/14/2018 1000   BUN 6 (L) 09/14/2018 1000   CREATININE 0.80 09/14/2018 1000   CREATININE 0.62 03/02/2017 0924      Component Value Date/Time   CALCIUM 9.7 09/14/2018 1000   ALKPHOS 95 (H) 09/14/2018 1000   AST 25 09/14/2018 1000   ALT 27 09/14/2018 1000   BILITOT 0.5 09/14/2018 1000

## 2018-11-16 ENCOUNTER — Other Ambulatory Visit: Payer: Self-pay

## 2018-11-16 ENCOUNTER — Encounter: Payer: Self-pay | Admitting: Hematology

## 2018-11-16 ENCOUNTER — Inpatient Hospital Stay: Payer: PPO

## 2018-11-16 ENCOUNTER — Inpatient Hospital Stay (HOSPITAL_BASED_OUTPATIENT_CLINIC_OR_DEPARTMENT_OTHER): Payer: PPO | Admitting: Hematology

## 2018-11-16 VITALS — BP 135/53 | HR 86 | Temp 98.6°F | Wt 190.1 lb

## 2018-11-16 DIAGNOSIS — D500 Iron deficiency anemia secondary to blood loss (chronic): Secondary | ICD-10-CM | POA: Diagnosis not present

## 2018-11-16 DIAGNOSIS — D573 Sickle-cell trait: Secondary | ICD-10-CM

## 2018-11-16 DIAGNOSIS — N921 Excessive and frequent menstruation with irregular cycle: Secondary | ICD-10-CM

## 2018-11-16 DIAGNOSIS — N920 Excessive and frequent menstruation with regular cycle: Secondary | ICD-10-CM

## 2018-11-16 LAB — CMP (CANCER CENTER ONLY)
ALBUMIN: 4.1 g/dL (ref 3.5–5.0)
ALK PHOS: 94 U/L (ref 38–126)
ALT: 22 U/L (ref 0–44)
AST: 17 U/L (ref 15–41)
Anion gap: 8 (ref 5–15)
BILIRUBIN TOTAL: 0.4 mg/dL (ref 0.3–1.2)
BUN: 8 mg/dL (ref 6–20)
CALCIUM: 9.2 mg/dL (ref 8.9–10.3)
CO2: 27 mmol/L (ref 22–32)
Chloride: 101 mmol/L (ref 98–111)
Creatinine: 0.68 mg/dL (ref 0.44–1.00)
GFR, Est AFR Am: >60 mL/min (ref >60)
GFR, Estimated: >60 mL/min (ref >60)
GLUCOSE: 287 mg/dL — AB (ref 70–99)
Potassium: 4.2 mmol/L (ref 3.5–5.1)
Sodium: 136 mmol/L (ref 135–145)
TOTAL PROTEIN: 6.9 g/dL (ref 6.5–8.1)

## 2018-11-16 LAB — CBC WITH DIFFERENTIAL (CANCER CENTER ONLY)
Abs Immature Granulocytes: 0.04 10*3/uL (ref 0.00–0.07)
Basophils Absolute: 0.1 10*3/uL (ref 0.0–0.1)
Basophils Relative: 1 %
EOS ABS: 0.1 10*3/uL (ref 0.0–0.5)
Eosinophils Relative: 1 %
HCT: 39 % (ref 36.0–46.0)
HEMOGLOBIN: 12.5 g/dL (ref 12.0–15.0)
Immature Granulocytes: 1 %
Lymphocytes Relative: 24 %
Lymphs Abs: 2.1 10*3/uL (ref 0.7–4.0)
MCH: 24.4 pg — ABNORMAL LOW (ref 26.0–34.0)
MCHC: 32.1 g/dL (ref 30.0–36.0)
MCV: 76.2 fL — AB (ref 80.0–100.0)
MONOS PCT: 6 %
Monocytes Absolute: 0.5 10*3/uL (ref 0.1–1.0)
Neutro Abs: 5.9 10*3/uL (ref 1.7–7.7)
Neutrophils Relative %: 67 %
Platelet Count: 234 10*3/uL (ref 150–400)
RBC: 5.12 MIL/uL — ABNORMAL HIGH (ref 3.87–5.11)
WBC Count: 8.7 10*3/uL (ref 4.0–10.5)
nRBC: 0 % (ref 0.0–0.2)

## 2018-11-16 LAB — FOLATE: FOLATE: 12.6 ng/mL (ref >5.9)

## 2018-11-16 LAB — IRON AND TIBC
IRON: 92 ug/dL (ref 41–142)
SATURATION RATIOS: 31 % (ref 21–57)
TIBC: 294 ug/dL (ref 236–444)
UIBC: 201 ug/dL (ref 120–384)

## 2018-11-16 LAB — FERRITIN: Ferritin: 198 ng/mL (ref 11–307)

## 2018-11-16 LAB — VITAMIN B12: VITAMIN B 12: 353 pg/mL (ref 180–914)

## 2018-11-22 ENCOUNTER — Encounter: Payer: PPO | Admitting: Gastroenterology

## 2018-12-24 DIAGNOSIS — I10 Essential (primary) hypertension: Secondary | ICD-10-CM | POA: Diagnosis not present

## 2018-12-24 DIAGNOSIS — D500 Iron deficiency anemia secondary to blood loss (chronic): Secondary | ICD-10-CM | POA: Diagnosis not present

## 2018-12-24 DIAGNOSIS — I119 Hypertensive heart disease without heart failure: Secondary | ICD-10-CM | POA: Diagnosis not present

## 2018-12-24 DIAGNOSIS — E1165 Type 2 diabetes mellitus with hyperglycemia: Secondary | ICD-10-CM | POA: Diagnosis not present

## 2018-12-24 DIAGNOSIS — E782 Mixed hyperlipidemia: Secondary | ICD-10-CM | POA: Diagnosis not present

## 2018-12-24 DIAGNOSIS — Z720 Tobacco use: Secondary | ICD-10-CM | POA: Diagnosis not present

## 2018-12-24 DIAGNOSIS — Z8673 Personal history of transient ischemic attack (TIA), and cerebral infarction without residual deficits: Secondary | ICD-10-CM | POA: Diagnosis not present

## 2018-12-24 DIAGNOSIS — N920 Excessive and frequent menstruation with regular cycle: Secondary | ICD-10-CM | POA: Diagnosis not present

## 2018-12-28 DIAGNOSIS — I10 Essential (primary) hypertension: Secondary | ICD-10-CM | POA: Diagnosis not present

## 2018-12-28 DIAGNOSIS — Z8673 Personal history of transient ischemic attack (TIA), and cerebral infarction without residual deficits: Secondary | ICD-10-CM | POA: Diagnosis not present

## 2018-12-28 DIAGNOSIS — Z720 Tobacco use: Secondary | ICD-10-CM | POA: Diagnosis not present

## 2018-12-28 DIAGNOSIS — D500 Iron deficiency anemia secondary to blood loss (chronic): Secondary | ICD-10-CM | POA: Diagnosis not present

## 2018-12-28 DIAGNOSIS — I119 Hypertensive heart disease without heart failure: Secondary | ICD-10-CM | POA: Diagnosis not present

## 2018-12-28 DIAGNOSIS — E1165 Type 2 diabetes mellitus with hyperglycemia: Secondary | ICD-10-CM | POA: Diagnosis not present

## 2018-12-28 DIAGNOSIS — E782 Mixed hyperlipidemia: Secondary | ICD-10-CM | POA: Diagnosis not present

## 2018-12-28 DIAGNOSIS — N920 Excessive and frequent menstruation with regular cycle: Secondary | ICD-10-CM | POA: Diagnosis not present

## 2019-01-09 DIAGNOSIS — I69351 Hemiplegia and hemiparesis following cerebral infarction affecting right dominant side: Secondary | ICD-10-CM | POA: Diagnosis not present

## 2019-01-09 DIAGNOSIS — I63312 Cerebral infarction due to thrombosis of left middle cerebral artery: Secondary | ICD-10-CM | POA: Diagnosis not present

## 2019-01-16 DIAGNOSIS — B351 Tinea unguium: Secondary | ICD-10-CM | POA: Diagnosis not present

## 2019-01-16 DIAGNOSIS — M792 Neuralgia and neuritis, unspecified: Secondary | ICD-10-CM | POA: Diagnosis not present

## 2019-01-16 DIAGNOSIS — L600 Ingrowing nail: Secondary | ICD-10-CM | POA: Diagnosis not present

## 2019-01-16 DIAGNOSIS — B353 Tinea pedis: Secondary | ICD-10-CM | POA: Diagnosis not present

## 2019-01-16 MED FILL — KETOCONAZOLE 2% CREAM: 2 | 30 days supply | Qty: 60 | Fill #0

## 2019-01-16 MED FILL — TERBINAFINE HCL 250 MG TAB: 250 | 30 days supply | Qty: 30 | Fill #0

## 2019-01-21 DIAGNOSIS — E782 Mixed hyperlipidemia: Secondary | ICD-10-CM | POA: Diagnosis not present

## 2019-01-21 DIAGNOSIS — Z720 Tobacco use: Secondary | ICD-10-CM | POA: Diagnosis not present

## 2019-01-21 DIAGNOSIS — Z8673 Personal history of transient ischemic attack (TIA), and cerebral infarction without residual deficits: Secondary | ICD-10-CM | POA: Diagnosis not present

## 2019-01-21 DIAGNOSIS — D500 Iron deficiency anemia secondary to blood loss (chronic): Secondary | ICD-10-CM | POA: Diagnosis not present

## 2019-01-21 DIAGNOSIS — N920 Excessive and frequent menstruation with regular cycle: Secondary | ICD-10-CM | POA: Diagnosis not present

## 2019-01-21 DIAGNOSIS — I119 Hypertensive heart disease without heart failure: Secondary | ICD-10-CM | POA: Diagnosis not present

## 2019-01-21 DIAGNOSIS — I10 Essential (primary) hypertension: Secondary | ICD-10-CM | POA: Diagnosis not present

## 2019-01-21 DIAGNOSIS — E1165 Type 2 diabetes mellitus with hyperglycemia: Secondary | ICD-10-CM | POA: Diagnosis not present

## 2019-01-22 MED FILL — LANTUS 100 UNITS/ML VIAL: 100 | 10 days supply | Qty: 10 | Fill #0

## 2019-01-22 MED FILL — VICTOZA 18 MG/3 ML INJECT P: 18 | 30 days supply | Qty: 9 | Fill #0

## 2019-02-19 ENCOUNTER — Ambulatory Visit: Payer: PPO | Admitting: Hematology

## 2019-02-19 ENCOUNTER — Other Ambulatory Visit: Payer: PPO

## 2019-02-26 DIAGNOSIS — E782 Mixed hyperlipidemia: Secondary | ICD-10-CM | POA: Diagnosis not present

## 2019-02-26 DIAGNOSIS — I119 Hypertensive heart disease without heart failure: Secondary | ICD-10-CM | POA: Diagnosis not present

## 2019-02-26 DIAGNOSIS — Z8673 Personal history of transient ischemic attack (TIA), and cerebral infarction without residual deficits: Secondary | ICD-10-CM | POA: Diagnosis not present

## 2019-02-26 DIAGNOSIS — E1165 Type 2 diabetes mellitus with hyperglycemia: Secondary | ICD-10-CM | POA: Diagnosis not present

## 2019-02-26 DIAGNOSIS — N920 Excessive and frequent menstruation with regular cycle: Secondary | ICD-10-CM | POA: Diagnosis not present

## 2019-02-26 DIAGNOSIS — D500 Iron deficiency anemia secondary to blood loss (chronic): Secondary | ICD-10-CM | POA: Diagnosis not present

## 2019-02-26 DIAGNOSIS — I10 Essential (primary) hypertension: Secondary | ICD-10-CM | POA: Diagnosis not present

## 2019-02-26 DIAGNOSIS — Z720 Tobacco use: Secondary | ICD-10-CM | POA: Diagnosis not present

## 2019-03-13 ENCOUNTER — Emergency Department (HOSPITAL_COMMUNITY)
Admission: EM | Admit: 2019-03-13 | Discharge: 2019-03-13 | Disposition: A | Discharge status: Home / Self Care | Payer: PPO | Attending: Emergency Medicine | Admitting: Emergency Medicine

## 2019-03-13 ENCOUNTER — Other Ambulatory Visit: Payer: Self-pay

## 2019-03-13 ENCOUNTER — Encounter (HOSPITAL_COMMUNITY): Payer: Self-pay | Admitting: Obstetrics and Gynecology

## 2019-03-13 DIAGNOSIS — Z8673 Personal history of transient ischemic attack (TIA), and cerebral infarction without residual deficits: Secondary | ICD-10-CM | POA: Insufficient documentation

## 2019-03-13 DIAGNOSIS — R55 Syncope and collapse: Secondary | ICD-10-CM | POA: Diagnosis not present

## 2019-03-13 DIAGNOSIS — Z79899 Other long term (current) drug therapy: Secondary | ICD-10-CM | POA: Insufficient documentation

## 2019-03-13 DIAGNOSIS — Z794 Long term (current) use of insulin: Secondary | ICD-10-CM | POA: Diagnosis not present

## 2019-03-13 DIAGNOSIS — D573 Sickle-cell trait: Secondary | ICD-10-CM | POA: Diagnosis not present

## 2019-03-13 DIAGNOSIS — E1165 Type 2 diabetes mellitus with hyperglycemia: Secondary | ICD-10-CM | POA: Diagnosis not present

## 2019-03-13 DIAGNOSIS — I10 Essential (primary) hypertension: Secondary | ICD-10-CM | POA: Insufficient documentation

## 2019-03-13 DIAGNOSIS — Z7982 Long term (current) use of aspirin: Secondary | ICD-10-CM | POA: Insufficient documentation

## 2019-03-13 DIAGNOSIS — R739 Hyperglycemia, unspecified: Secondary | ICD-10-CM

## 2019-03-13 LAB — URINALYSIS, ROUTINE W REFLEX MICROSCOPIC
Bilirubin Urine: NEGATIVE
Hgb urine dipstick: NEGATIVE
Ketones, ur: 5 mg/dL — AB
Leukocytes,Ua: NEGATIVE
Nitrite: NEGATIVE
PH: 5 (ref 5.0–8.0)
Protein, ur: NEGATIVE mg/dL
Specific Gravity, Urine: 1.01 (ref 1.005–1.030)

## 2019-03-13 LAB — BASIC METABOLIC PANEL
ANION GAP: 11 (ref 5–15)
BUN: 14 mg/dL (ref 6–20)
CO2: 24 mmol/L (ref 22–32)
Calcium: 9.2 mg/dL (ref 8.9–10.3)
Chloride: 99 mmol/L (ref 98–111)
Creatinine, Ser: 0.82 mg/dL (ref 0.44–1.00)
GFR calc Af Amer: >60 mL/min (ref >60)
GFR calc non Af Amer: >60 mL/min (ref >60)
Glucose, Bld: 303 mg/dL — ABNORMAL HIGH (ref 70–99)
Potassium: 4 mmol/L (ref 3.5–5.1)
Sodium: 134 mmol/L — ABNORMAL LOW (ref 135–145)

## 2019-03-13 LAB — CBC
HCT: 39.8 % (ref 36.0–46.0)
Hemoglobin: 12.7 g/dL (ref 12.0–15.0)
MCH: 28.3 pg (ref 26.0–34.0)
MCHC: 31.9 g/dL (ref 30.0–36.0)
MCV: 88.8 fL (ref 80.0–100.0)
PLATELETS: 235 10*3/uL (ref 150–400)
RBC: 4.48 MIL/uL (ref 3.87–5.11)
RDW: 13 % (ref 11.5–15.5)
WBC: 4.1 10*3/uL (ref 4.0–10.5)
nRBC: 0 % (ref 0.0–0.2)

## 2019-03-13 LAB — I-STAT BETA HCG BLOOD, ED (MC, WL, AP ONLY): I-stat hCG, quantitative: <5 m[IU]/mL (ref <5)

## 2019-03-13 MED ORDER — SODIUM CHLORIDE 0.9% FLUSH
3.0000 mL | Freq: Once | INTRAVENOUS | Status: AC
  Administered 2019-03-13: 3 mL via INTRAVENOUS

## 2019-03-13 NOTE — ED Notes (Signed)
Pt ambulatory to bathroom to provide urine sample. No distress noted.

## 2019-03-13 NOTE — ED Notes (Signed)
EDP at bedside  

## 2019-03-13 NOTE — ED Provider Notes (Signed)
[facility] DEPT Provider Note   CSN: [id number] Arrival date & time: 03/13/19  1143    History   Chief Complaint Chief Complaint  Patient presents with  . Loss of Consciousness    HPI [name] is a 53 y.o. female.     Patient got off her bed this morning and had a syncopal spell.  No prodromal illnesses or events.  She woke up on the floor and notified her mother.  Her mother encouraged her to come to the emergency department.  She has no complaints at this time.  Chest pain, dyspnea, palpitations, neuro deficits, fever, chills, dysuria.  She feels back to baseline.  Past medical history includes CVA with right arm weakness, hypertension, diabetes.  Patient cannot correlate this event with anything that she did at home.     Past Medical History:  Diagnosis Date  . Diabetes mellitus without complication ([place]) 7672   previously on [place]   . Gestational diabetes 2009   . Hypertension 09/07/2014  . Stroke ([place]) 09/07/2014   R side. weakness in upper arm. No deficit in leg. deficit in speech. A littel bit in coordination.     Patient Active Problem List   Diagnosis Date Noted  . Sickle cell trait ([place]) 09/14/2018  . Iron deficiency anemia due to chronic blood loss 09/12/2018  . Menorrhagia 09/12/2018  . Medically noncompliant 12/29/2017  . Microcytic anemia 09/26/2017  . 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 Aphasia, late effect of cerebrovascular disease 03/14/2017  . Dysarthria as late effect of cerebrovascular accident (CVA) 03/14/2017  . Contracture of joint of finger of right hand 02/02/2017  . Right hand weakness 02/02/2017  . CVA (cerebral vascular accident) ([place]) 09/29/2015  . HTN (hypertension) 09/29/2015  . Diabetes mellitus type 2, uncontrolled, with complications ([place]) 09/47/0962  . Eczema 09/29/2015    Past Surgical History:  Procedure Laterality Date  . BREAST SURGERY    . CESAREAN SECTION    .  TUBAL LIGATION  2009     OB History   No obstetric history on file.      Home Medications    Prior to Admission medications   Medication Sig Start Date End Date Taking? Authorizing Provider  amLODipine (NORVASC) 10 MG tablet Take 1 tablet (10 mg total) by mouth daily. 06/22/17  Yes Funches, Adriana Mccallum, MD  aspirin EC 81 MG tablet Take 1 tablet (81 mg total) by mouth daily. 09/26/17  Yes Ladell Pier, MD  LANTUS 100 UNIT/ML injection Inject 25 Units into the skin daily. 01/22/19  Yes [provider]  lovastatin (MEVACOR) 40 MG tablet Take 1 tablet (40 mg total) by mouth at bedtime. 09/26/17  Yes Ladell Pier, MD  metoprolol succinate (TOPROL-XL) 25 MG 24 hr tablet Take 25 mg by mouth daily. 01/14/19  Yes [provider]  sitaGLIPtin-metformin (JANUMET) 50-1000 MG tablet Take 1 tablet by mouth 2 (two) times daily with a meal. 06/22/17  Yes Funches, Josalyn, MD  VICTOZA 18 MG/3ML SOPN Inject 6 mg into the skin daily. 01/22/19  Yes [provider]  Blood Glucose Monitoring Suppl (TRUE METRIX METER) w/Device KIT 1 each by Does not apply route as needed. 10/13/16   Boykin Nearing, MD  Elastic Bandages & Supports (FINGER SPLINT) MISC 1 each by Does not apply route daily. On Left thumb and ring finger 06/22/17   Funches, Josalyn, MD  ferrous sulfate (FERROUSUL) 325 (65 FE) MG tablet Take 1 tablet (325  mg total) by mouth daily with breakfast. Patient not taking: Reported on 03/13/2019 09/14/18 12/13/18  Tish Men, MD  glipiZIDE (GLUCOTROL) 10 MG tablet Take 1 tablet (10 mg total) by mouth 2 (two) times daily before a meal. Patient not taking: Reported on 03/13/2019 06/22/17   Boykin Nearing, MD  glucose blood (TRUE METRIX BLOOD GLUCOSE TEST) test strip 1 each by Other route 2 (two) times daily. 10/13/16   Funches, Adriana Mccallum, MD  nortriptyline (PAMELOR) 25 MG capsule Take 1 capsule (25 mg total) by mouth at bedtime. Patient not taking: Reported on 03/13/2019 03/27/18   Pieter Partridge, DO  SUPREP BOWEL PREP KIT 17.5-3.13-1.6 GM/177ML SOLN Take 1 kit by mouth as directed. Patient not taking: Reported on 03/13/2019 10/22/18   Jackquline Denmark, MD  TRUEPLUS LANCETS 28G MISC 1 each by Does not apply route 2 (two) times daily. 10/13/16   Boykin Nearing, MD    Family History Family History  Problem Relation Age of Onset  . Diabetes Mother   . Hypertension Mother   . Epilepsy Father   . Hypertension Sister   . Colon cancer Neg Hx     Social History Social History   Tobacco Use  . Smoking status: Never Smoker  . Smokeless tobacco: Never Used  Substance Use Topics  . Alcohol use: No  . Drug use: Yes    Types: Marijuana     Allergies   Lipitor [atorvastatin]   Review of Systems Review of Systems  All other systems reviewed and are negative.    Physical Exam Updated Vital Signs BP 127/70   Pulse 82   Temp 98.7 F (37.1 C)   Resp 16   Ht _0  (1.499 m)   Wt 83.9 kg   SpO2 98%   BMI 37.37 kg/m   Physical Exam Vitals signs and nursing note reviewed.  Constitutional:      Appearance: She is well-developed.  HENT:     Head: Normocephalic and atraumatic.  Eyes:     Conjunctiva/sclera: Conjunctivae normal.  Neck:     Musculoskeletal: Neck supple.  Cardiovascular:     Rate and Rhythm: Normal rate and regular rhythm.  Pulmonary:     Effort: Pulmonary effort is normal.     Breath sounds: Normal breath sounds.  Abdominal:     General: Bowel sounds are normal.     Palpations: Abdomen is soft.  Musculoskeletal: Normal range of motion.  Skin:    General: Skin is warm and dry.  Neurological:     Mental Status: She is alert and oriented to person, place, and time.     Comments: Weakness right arm (old)  Psychiatric:        Behavior: Behavior normal.      ED Treatments / Results  Labs (all labs ordered are listed, but only abnormal results are displayed) Labs Reviewed  BASIC METABOLIC PANEL - Abnormal; Notable for the following  components:      Result Value   Sodium 134 (*)    Glucose, Bld 303 (*)    All other components within normal limits  URINALYSIS, ROUTINE W REFLEX MICROSCOPIC - Abnormal; Notable for the following components:   APPearance HAZY (*)    Glucose, UA >=500 (*)    Ketones, ur 5 (*)    Bacteria, UA RARE (*)    All other components within normal limits  CBC  I-STAT BETA HCG BLOOD, ED ([facility], [facility], [facility] ONLY)    EKG EKG Interpretation  Date/Time:  Wednesday March 13 2019 11:53:51 EDT Ventricular Rate:  96 PR Interval:    QRS Duration: 72 QT Interval:  333 QTC Calculation: 421 R Axis:   78 Text Interpretation:  Sinus rhythm Low voltage, precordial leads Anteroseptal infarct, old Baseline wander in lead(s) V3 Confirmed by Nat Christen [phone]) on 03/13/2019 1:52:22 PM   Radiology No results found.  Procedures Procedures (including critical care time)  Medications Ordered in ED Medications  sodium chloride flush (NS) 0.9 % injection 3 mL (3 mLs Intravenous Given 03/13/19 1242)     Initial Impression / Assessment and Plan / ED Course  I have reviewed the triage vital signs and the nursing notes.  Pertinent labs & imaging results that were available during my care of the patient were reviewed by me and considered in my medical decision making (see chart for details).        Patient presents with syncopal event.  She has a normal exam in the ED.  EKG normal sinus rhythm.  Hemoglobin stable.  Glucose 303.  She has not taken her medications today.  Patient is stable at discharge.  Final Clinical Impressions(s) / ED Diagnoses   Final diagnoses:  Syncope, unspecified syncope type  Hyperglycemia    ED Discharge Orders    None       Nat Christen, MD 03/13/19 1409
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
## 2019-03-13 NOTE — ED Triage Notes (Signed)
Pt reports she had an episode of LOC. Pt reports she had a stroke in 2015. Neuro exam unremarkable at this time. Pt reports no blurred vision, memory loss or difficulty speaking or swallowing.  Pt reports she became dizzy before she passed out and does not remember if she hit her head or not. Pt alert and oriented and in no active distress at this time.

## 2019-03-13 NOTE — ED Notes (Signed)
Pt d/c home per MD order. Discharge summary reviewed with pt. Pt verbalizes understanding. Reports daughter is her ride home. Ambulatory off unit

## 2019-03-13 NOTE — Discharge Instructions (Addendum)
Your sugar or glucose was elevated at 303.  Otherwise tests were normal.  Increase fluids.  Eat a regular balanced diet.  Follow-up with your primary care doctor.

## 2019-03-26 DIAGNOSIS — N920 Excessive and frequent menstruation with regular cycle: Secondary | ICD-10-CM | POA: Diagnosis not present

## 2019-03-26 DIAGNOSIS — I10 Essential (primary) hypertension: Secondary | ICD-10-CM | POA: Diagnosis not present

## 2019-03-26 DIAGNOSIS — Z23 Encounter for immunization: Secondary | ICD-10-CM | POA: Diagnosis not present

## 2019-03-26 DIAGNOSIS — D500 Iron deficiency anemia secondary to blood loss (chronic): Secondary | ICD-10-CM | POA: Diagnosis not present

## 2019-03-26 DIAGNOSIS — Z720 Tobacco use: Secondary | ICD-10-CM | POA: Diagnosis not present

## 2019-03-26 DIAGNOSIS — I119 Hypertensive heart disease without heart failure: Secondary | ICD-10-CM | POA: Diagnosis not present

## 2019-03-26 DIAGNOSIS — Z01118 Encounter for examination of ears and hearing with other abnormal findings: Secondary | ICD-10-CM | POA: Diagnosis not present

## 2019-03-26 DIAGNOSIS — Z1329 Encounter for screening for other suspected endocrine disorder: Secondary | ICD-10-CM | POA: Diagnosis not present

## 2019-03-26 DIAGNOSIS — Z01021 Encounter for examination of eyes and vision following failed vision screening with abnormal findings: Secondary | ICD-10-CM | POA: Diagnosis not present

## 2019-03-26 DIAGNOSIS — N35912 Unspecified bulbous urethral stricture, male: Secondary | ICD-10-CM | POA: Diagnosis not present

## 2019-03-26 DIAGNOSIS — Z1389 Encounter for screening for other disorder: Secondary | ICD-10-CM | POA: Diagnosis not present

## 2019-03-26 DIAGNOSIS — Z5181 Encounter for therapeutic drug level monitoring: Secondary | ICD-10-CM | POA: Diagnosis not present

## 2019-03-26 DIAGNOSIS — Z960 Presence of urogenital implants: Secondary | ICD-10-CM | POA: Diagnosis not present

## 2019-03-26 DIAGNOSIS — E782 Mixed hyperlipidemia: Secondary | ICD-10-CM | POA: Diagnosis not present

## 2019-03-26 DIAGNOSIS — Z131 Encounter for screening for diabetes mellitus: Secondary | ICD-10-CM | POA: Diagnosis not present

## 2019-03-26 DIAGNOSIS — Z8673 Personal history of transient ischemic attack (TIA), and cerebral infarction without residual deficits: Secondary | ICD-10-CM | POA: Diagnosis not present

## 2019-03-26 DIAGNOSIS — E1165 Type 2 diabetes mellitus with hyperglycemia: Secondary | ICD-10-CM | POA: Diagnosis not present

## 2019-03-26 DIAGNOSIS — Z0001 Encounter for general adult medical examination with abnormal findings: Secondary | ICD-10-CM | POA: Diagnosis not present

## 2019-04-15 DIAGNOSIS — N6311 Unspecified lump in the right breast, upper outer quadrant: Secondary | ICD-10-CM | POA: Diagnosis not present

## 2019-04-15 DIAGNOSIS — N6321 Unspecified lump in the left breast, upper outer quadrant: Secondary | ICD-10-CM | POA: Diagnosis not present

## 2019-04-15 DIAGNOSIS — Z01419 Encounter for gynecological examination (general) (routine) without abnormal findings: Secondary | ICD-10-CM | POA: Diagnosis not present

## 2019-05-01 DIAGNOSIS — Z1212 Encounter for screening for malignant neoplasm of rectum: Secondary | ICD-10-CM | POA: Diagnosis not present

## 2019-05-14 DIAGNOSIS — Z0001 Encounter for general adult medical examination with abnormal findings: Secondary | ICD-10-CM | POA: Diagnosis not present

## 2019-05-14 DIAGNOSIS — I10 Essential (primary) hypertension: Secondary | ICD-10-CM | POA: Diagnosis not present

## 2019-05-14 DIAGNOSIS — D500 Iron deficiency anemia secondary to blood loss (chronic): Secondary | ICD-10-CM | POA: Diagnosis not present

## 2019-05-14 DIAGNOSIS — Z8673 Personal history of transient ischemic attack (TIA), and cerebral infarction without residual deficits: Secondary | ICD-10-CM | POA: Diagnosis not present

## 2019-05-14 DIAGNOSIS — E1165 Type 2 diabetes mellitus with hyperglycemia: Secondary | ICD-10-CM | POA: Diagnosis not present

## 2019-05-14 DIAGNOSIS — Z720 Tobacco use: Secondary | ICD-10-CM | POA: Diagnosis not present

## 2019-05-14 DIAGNOSIS — E782 Mixed hyperlipidemia: Secondary | ICD-10-CM | POA: Diagnosis not present

## 2019-05-14 DIAGNOSIS — I119 Hypertensive heart disease without heart failure: Secondary | ICD-10-CM | POA: Diagnosis not present

## 2019-05-14 DIAGNOSIS — N920 Excessive and frequent menstruation with regular cycle: Secondary | ICD-10-CM | POA: Diagnosis not present

## 2019-07-23 DIAGNOSIS — R921 Mammographic calcification found on diagnostic imaging of breast: Secondary | ICD-10-CM | POA: Diagnosis not present

## 2019-07-23 DIAGNOSIS — N6321 Unspecified lump in the left breast, upper outer quadrant: Secondary | ICD-10-CM | POA: Diagnosis not present

## 2019-09-06 DIAGNOSIS — M654 Radial styloid tenosynovitis [de Quervain]: Secondary | ICD-10-CM | POA: Diagnosis not present

## 2019-11-19 LAB — COLOGUARD: Cologuard: NEGATIVE

## 2020-02-14 DIAGNOSIS — H538 Other visual disturbances: Secondary | ICD-10-CM | POA: Diagnosis not present

## 2020-02-14 DIAGNOSIS — H524 Presbyopia: Secondary | ICD-10-CM | POA: Diagnosis not present

## 2020-02-14 DIAGNOSIS — H2510 Age-related nuclear cataract, unspecified eye: Secondary | ICD-10-CM | POA: Diagnosis not present

## 2020-02-14 DIAGNOSIS — E11319 Type 2 diabetes mellitus with unspecified diabetic retinopathy without macular edema: Secondary | ICD-10-CM | POA: Diagnosis not present

## 2020-03-11 DIAGNOSIS — E559 Vitamin D deficiency, unspecified: Secondary | ICD-10-CM | POA: Diagnosis not present

## 2020-03-11 DIAGNOSIS — Z1231 Encounter for screening mammogram for malignant neoplasm of breast: Secondary | ICD-10-CM | POA: Diagnosis not present

## 2020-03-11 DIAGNOSIS — E119 Type 2 diabetes mellitus without complications: Secondary | ICD-10-CM | POA: Diagnosis not present

## 2020-03-11 DIAGNOSIS — I10 Essential (primary) hypertension: Secondary | ICD-10-CM | POA: Diagnosis not present

## 2020-03-27 DIAGNOSIS — E559 Vitamin D deficiency, unspecified: Secondary | ICD-10-CM | POA: Diagnosis not present

## 2020-03-27 DIAGNOSIS — E119 Type 2 diabetes mellitus without complications: Secondary | ICD-10-CM | POA: Diagnosis not present

## 2020-03-27 DIAGNOSIS — E785 Hyperlipidemia, unspecified: Secondary | ICD-10-CM | POA: Diagnosis not present

## 2020-03-27 DIAGNOSIS — E1169 Type 2 diabetes mellitus with other specified complication: Secondary | ICD-10-CM | POA: Diagnosis not present

## 2020-03-27 DIAGNOSIS — I10 Essential (primary) hypertension: Secondary | ICD-10-CM | POA: Diagnosis not present

## 2020-04-02 DIAGNOSIS — E785 Hyperlipidemia, unspecified: Secondary | ICD-10-CM | POA: Insufficient documentation

## 2020-04-02 DIAGNOSIS — E559 Vitamin D deficiency, unspecified: Secondary | ICD-10-CM | POA: Insufficient documentation

## 2020-04-21 DIAGNOSIS — E119 Type 2 diabetes mellitus without complications: Secondary | ICD-10-CM | POA: Diagnosis not present

## 2020-05-08 DIAGNOSIS — Z01419 Encounter for gynecological examination (general) (routine) without abnormal findings: Secondary | ICD-10-CM | POA: Diagnosis not present

## 2020-05-12 LAB — HM PAP SMEAR: HM Pap smear: NEGATIVE

## 2020-05-13 DIAGNOSIS — M791 Myalgia, unspecified site: Secondary | ICD-10-CM | POA: Diagnosis not present

## 2020-05-31 ENCOUNTER — Emergency Department (HOSPITAL_COMMUNITY)
Admission: EM | Admit: 2020-05-31 | Discharge: 2020-06-01 | Disposition: A | Discharge status: Home / Self Care | Payer: PPO | Attending: Emergency Medicine | Admitting: Emergency Medicine

## 2020-05-31 ENCOUNTER — Other Ambulatory Visit: Payer: Self-pay

## 2020-05-31 ENCOUNTER — Encounter (HOSPITAL_COMMUNITY): Payer: Self-pay

## 2020-05-31 DIAGNOSIS — E119 Type 2 diabetes mellitus without complications: Secondary | ICD-10-CM | POA: Diagnosis not present

## 2020-05-31 DIAGNOSIS — S4992XA Unspecified injury of left shoulder and upper arm, initial encounter: Secondary | ICD-10-CM

## 2020-05-31 DIAGNOSIS — I10 Essential (primary) hypertension: Secondary | ICD-10-CM | POA: Diagnosis not present

## 2020-05-31 DIAGNOSIS — Y92410 Unspecified street and highway as the place of occurrence of the external cause: Secondary | ICD-10-CM | POA: Diagnosis not present

## 2020-05-31 DIAGNOSIS — S298XXA Other specified injuries of thorax, initial encounter: Secondary | ICD-10-CM | POA: Insufficient documentation

## 2020-05-31 DIAGNOSIS — S299XXA Unspecified injury of thorax, initial encounter: Secondary | ICD-10-CM | POA: Diagnosis not present

## 2020-05-31 DIAGNOSIS — Z7984 Long term (current) use of oral hypoglycemic drugs: Secondary | ICD-10-CM | POA: Insufficient documentation

## 2020-05-31 DIAGNOSIS — Y999 Unspecified external cause status: Secondary | ICD-10-CM | POA: Diagnosis not present

## 2020-05-31 DIAGNOSIS — Y939 Activity, unspecified: Secondary | ICD-10-CM | POA: Diagnosis not present

## 2020-05-31 DIAGNOSIS — J984 Other disorders of lung: Secondary | ICD-10-CM | POA: Diagnosis not present

## 2020-05-31 DIAGNOSIS — M25512 Pain in left shoulder: Secondary | ICD-10-CM | POA: Diagnosis not present
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
## 2020-06-01 ENCOUNTER — Emergency Department (HOSPITAL_COMMUNITY): Payer: PPO

## 2020-06-01 DIAGNOSIS — J984 Other disorders of lung: Secondary | ICD-10-CM | POA: Diagnosis not present

## 2020-06-01 DIAGNOSIS — S4992XA Unspecified injury of left shoulder and upper arm, initial encounter: Secondary | ICD-10-CM | POA: Diagnosis not present

## 2020-06-01 DIAGNOSIS — M25512 Pain in left shoulder: Secondary | ICD-10-CM | POA: Diagnosis not present

## 2020-06-01 MED ORDER — NAPROXEN 500 MG PO TABS
500.0000 mg | ORAL_TABLET | Freq: Once | ORAL | Status: AC
  Administered 2020-06-01: 500 mg via ORAL
  Filled 2020-06-01: qty 1

## 2020-06-01 MED ORDER — NAPROXEN 375 MG PO TABS: ORAL_TABLET | ORAL | 0 refills | Status: DC

## 2020-06-01 MED ORDER — CYCLOBENZAPRINE HCL 10 MG PO TABS: 10.0000 mg | ORAL_TABLET | Freq: Three times a day (TID) | ORAL | 0 refills | Status: DC | PRN
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
## 2020-06-10 DIAGNOSIS — M542 Cervicalgia: Secondary | ICD-10-CM | POA: Diagnosis not present

## 2020-06-10 DIAGNOSIS — M25512 Pain in left shoulder: Secondary | ICD-10-CM | POA: Diagnosis not present

## 2020-06-25 DIAGNOSIS — M25612 Stiffness of left shoulder, not elsewhere classified: Secondary | ICD-10-CM | POA: Diagnosis not present

## 2020-06-25 DIAGNOSIS — S46812D Strain of other muscles, fascia and tendons at shoulder and upper arm level, left arm, subsequent encounter: Secondary | ICD-10-CM | POA: Diagnosis not present

## 2020-06-25 DIAGNOSIS — M6281 Muscle weakness (generalized): Secondary | ICD-10-CM | POA: Diagnosis not present

## 2020-06-25 DIAGNOSIS — M542 Cervicalgia: Secondary | ICD-10-CM | POA: Diagnosis not present

## 2020-06-25 DIAGNOSIS — M25512 Pain in left shoulder: Secondary | ICD-10-CM | POA: Diagnosis not present

## 2020-06-29 DIAGNOSIS — E785 Hyperlipidemia, unspecified: Secondary | ICD-10-CM | POA: Diagnosis not present

## 2020-06-29 DIAGNOSIS — M6281 Muscle weakness (generalized): Secondary | ICD-10-CM | POA: Diagnosis not present

## 2020-06-29 DIAGNOSIS — M542 Cervicalgia: Secondary | ICD-10-CM | POA: Diagnosis not present

## 2020-06-29 DIAGNOSIS — E1169 Type 2 diabetes mellitus with other specified complication: Secondary | ICD-10-CM | POA: Diagnosis not present

## 2020-06-29 DIAGNOSIS — M25612 Stiffness of left shoulder, not elsewhere classified: Secondary | ICD-10-CM | POA: Diagnosis not present

## 2020-06-29 DIAGNOSIS — I10 Essential (primary) hypertension: Secondary | ICD-10-CM | POA: Diagnosis not present

## 2020-06-29 DIAGNOSIS — S46812D Strain of other muscles, fascia and tendons at shoulder and upper arm level, left arm, subsequent encounter: Secondary | ICD-10-CM | POA: Diagnosis not present

## 2020-06-29 DIAGNOSIS — M25512 Pain in left shoulder: Secondary | ICD-10-CM | POA: Diagnosis not present

## 2020-07-03 DIAGNOSIS — G8929 Other chronic pain: Secondary | ICD-10-CM | POA: Diagnosis not present

## 2020-07-03 DIAGNOSIS — M5442 Lumbago with sciatica, left side: Secondary | ICD-10-CM | POA: Diagnosis not present

## 2020-07-03 DIAGNOSIS — E1169 Type 2 diabetes mellitus with other specified complication: Secondary | ICD-10-CM | POA: Diagnosis not present

## 2020-07-03 DIAGNOSIS — M5441 Lumbago with sciatica, right side: Secondary | ICD-10-CM | POA: Diagnosis not present

## 2020-07-03 DIAGNOSIS — E785 Hyperlipidemia, unspecified: Secondary | ICD-10-CM | POA: Diagnosis not present

## 2020-07-03 DIAGNOSIS — I10 Essential (primary) hypertension: Secondary | ICD-10-CM | POA: Diagnosis not present

## 2020-07-06 DIAGNOSIS — S46812D Strain of other muscles, fascia and tendons at shoulder and upper arm level, left arm, subsequent encounter: Secondary | ICD-10-CM | POA: Diagnosis not present

## 2020-07-06 DIAGNOSIS — M25612 Stiffness of left shoulder, not elsewhere classified: Secondary | ICD-10-CM | POA: Diagnosis not present

## 2020-07-06 DIAGNOSIS — M6281 Muscle weakness (generalized): Secondary | ICD-10-CM | POA: Diagnosis not present

## 2020-07-08 DIAGNOSIS — M5441 Lumbago with sciatica, right side: Secondary | ICD-10-CM | POA: Diagnosis not present

## 2020-07-08 DIAGNOSIS — M545 Low back pain: Secondary | ICD-10-CM | POA: Diagnosis not present

## 2020-07-08 DIAGNOSIS — M5442 Lumbago with sciatica, left side: Secondary | ICD-10-CM | POA: Diagnosis not present

## 2020-07-17 DIAGNOSIS — R29898 Other symptoms and signs involving the musculoskeletal system: Secondary | ICD-10-CM | POA: Diagnosis not present

## 2020-07-17 DIAGNOSIS — I6930 Unspecified sequelae of cerebral infarction: Secondary | ICD-10-CM | POA: Diagnosis not present

## 2020-07-17 DIAGNOSIS — E119 Type 2 diabetes mellitus without complications: Secondary | ICD-10-CM | POA: Diagnosis not present

## 2020-09-21 ENCOUNTER — Ambulatory Visit: Payer: PPO | Attending: Internal Medicine

## 2020-09-21 DIAGNOSIS — Z23 Encounter for immunization: Secondary | ICD-10-CM

## 2020-09-21 NOTE — Progress Notes (Signed)
   Covid-19 Vaccination Clinic  Name:  ANAKAREN C[facility]ION    MRN: [id number] DOB: [birth year]  09/21/2020  Ms. Bene was observed post Covid-19 immunization for 15 minutes without incident. She was provided with Vaccine Information Sheet and instruction to access the V-Safe system.   Ms. Oswald was instructed to call 911 with any severe reactions post vaccine: Marland Kitchen Difficulty breathing  . Swelling of face and throat  . A fast heartbeat  . A bad rash all over body  . Dizziness and weakness   Immunizations Administered    Name Date Dose VIS Date Route   JANSSEN COVID-19 VACCINE 09/21/2020  9:40 AM 0.5 mL 02/15/2020 Intramuscular   Manufacturer: [employer]   Lot: [id number]   NDC: 41583-094-07
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
## 2021-03-02 DIAGNOSIS — H65191 Other acute nonsuppurative otitis media, right ear: Secondary | ICD-10-CM | POA: Diagnosis not present

## 2021-03-08 DIAGNOSIS — E119 Type 2 diabetes mellitus without complications: Secondary | ICD-10-CM | POA: Diagnosis not present

## 2021-03-08 DIAGNOSIS — I10 Essential (primary) hypertension: Secondary | ICD-10-CM | POA: Diagnosis not present

## 2021-03-08 DIAGNOSIS — R519 Headache, unspecified: Secondary | ICD-10-CM | POA: Diagnosis not present

## 2021-03-08 DIAGNOSIS — Z794 Long term (current) use of insulin: Secondary | ICD-10-CM | POA: Diagnosis not present

## 2021-03-08 DIAGNOSIS — E1169 Type 2 diabetes mellitus with other specified complication: Secondary | ICD-10-CM | POA: Diagnosis not present

## 2021-03-08 DIAGNOSIS — E785 Hyperlipidemia, unspecified: Secondary | ICD-10-CM | POA: Diagnosis not present

## 2021-05-18 DIAGNOSIS — E1169 Type 2 diabetes mellitus with other specified complication: Secondary | ICD-10-CM | POA: Diagnosis not present

## 2021-05-18 DIAGNOSIS — M65332 Trigger finger, left middle finger: Secondary | ICD-10-CM | POA: Diagnosis not present

## 2021-05-18 DIAGNOSIS — E785 Hyperlipidemia, unspecified: Secondary | ICD-10-CM | POA: Diagnosis not present

## 2021-05-18 DIAGNOSIS — I10 Essential (primary) hypertension: Secondary | ICD-10-CM | POA: Diagnosis not present

## 2021-05-18 DIAGNOSIS — E119 Type 2 diabetes mellitus without complications: Secondary | ICD-10-CM | POA: Diagnosis not present

## 2021-05-31 DIAGNOSIS — M65332 Trigger finger, left middle finger: Secondary | ICD-10-CM | POA: Diagnosis not present

## 2021-06-18 DIAGNOSIS — M5432 Sciatica, left side: Secondary | ICD-10-CM | POA: Diagnosis not present

## 2021-08-24 DIAGNOSIS — E1169 Type 2 diabetes mellitus with other specified complication: Secondary | ICD-10-CM | POA: Diagnosis not present

## 2021-08-24 DIAGNOSIS — E785 Hyperlipidemia, unspecified: Secondary | ICD-10-CM | POA: Diagnosis not present

## 2021-08-24 DIAGNOSIS — E119 Type 2 diabetes mellitus without complications: Secondary | ICD-10-CM | POA: Diagnosis not present

## 2021-08-24 DIAGNOSIS — G8320 Monoplegia of upper limb affecting unspecified side: Secondary | ICD-10-CM | POA: Diagnosis not present

## 2021-08-24 DIAGNOSIS — I10 Essential (primary) hypertension: Secondary | ICD-10-CM | POA: Diagnosis not present

## 2021-10-06 DIAGNOSIS — Z23 Encounter for immunization: Secondary | ICD-10-CM | POA: Diagnosis not present

## 2021-10-27 ENCOUNTER — Emergency Department (HOSPITAL_COMMUNITY)
Admission: EM | Admit: 2021-10-27 | Discharge: 2021-10-27 | Discharge status: Against Medical Advice | Payer: PPO | Attending: Emergency Medicine | Admitting: Emergency Medicine

## 2021-10-27 ENCOUNTER — Emergency Department (HOSPITAL_COMMUNITY): Payer: PPO

## 2021-10-27 ENCOUNTER — Encounter (HOSPITAL_COMMUNITY): Payer: Self-pay

## 2021-10-27 DIAGNOSIS — R079 Chest pain, unspecified: Secondary | ICD-10-CM | POA: Insufficient documentation

## 2021-10-27 DIAGNOSIS — R002 Palpitations: Secondary | ICD-10-CM | POA: Diagnosis not present

## 2021-10-27 DIAGNOSIS — Z5321 Procedure and treatment not carried out due to patient leaving prior to being seen by health care provider: Secondary | ICD-10-CM | POA: Insufficient documentation

## 2021-10-27 DIAGNOSIS — R0789 Other chest pain: Secondary | ICD-10-CM | POA: Diagnosis not present

## 2021-10-27 LAB — BASIC METABOLIC PANEL
Anion gap: 6 (ref 5–15)
BUN: 17 mg/dL (ref 6–20)
CO2: 27 mmol/L (ref 22–32)
Calcium: 9.8 mg/dL (ref 8.9–10.3)
Chloride: 102 mmol/L (ref 98–111)
Creatinine, Ser: 0.69 mg/dL (ref 0.44–1.00)
GFR, Estimated: >60 mL/min (ref >60)
Glucose, Bld: 238 mg/dL — ABNORMAL HIGH (ref 70–99)
Potassium: 4 mmol/L (ref 3.5–5.1)
Sodium: 135 mmol/L (ref 135–145)

## 2021-10-27 LAB — CBC
HCT: 40 % (ref 36.0–46.0)
Hemoglobin: 13.6 g/dL (ref 12.0–15.0)
MCH: 28.4 pg (ref 26.0–34.0)
MCHC: 34 g/dL (ref 30.0–36.0)
MCV: 83.5 fL (ref 80.0–100.0)
Platelets: 321 10*3/uL (ref 150–400)
RBC: 4.79 MIL/uL (ref 3.87–5.11)
RDW: 13.2 % (ref 11.5–15.5)
WBC: 6.5 10*3/uL (ref 4.0–10.5)
nRBC: 0 % (ref 0.0–0.2)

## 2021-10-27 LAB — TROPONIN I (HIGH SENSITIVITY): Troponin I (High Sensitivity): 3 ng/L (ref <18)

## 2021-10-27 LAB — I-STAT BETA HCG BLOOD, ED (MC, WL, AP ONLY): I-stat hCG, quantitative: <5 m[IU]/mL (ref <5)

## 2021-10-27 MED ORDER — ASPIRIN 81 MG PO CHEW
324.0000 mg | CHEWABLE_TABLET | Freq: Once | ORAL | Status: AC
  Administered 2021-10-27: 162 mg via ORAL
  Filled 2021-10-27: qty 4

## 2021-10-27 NOTE — ED Provider Notes (Signed)
.  Emergency Medicine Provider Triage Evaluation Note  [name] , a 55 y.o. female with history significant for hypertension and diabetes was evaluated in triage.  Pt complains of substernal chest pain that has been ongoing for about 2 days.  Patient states pain comes and goes, lasts for about 30 minutes each time describes the pain as a tightness she has residual right-sided deficits from previous stroke, however currently reporting intermittent numbness/tingling of the left upper extremity.  Review of Systems  Positive: Chest pain, numbness Negative: SOB, palpitations, vision, changes, headache, diaphoresis  Physical Exam  BP (!) 175/80 (BP Location: Left Arm)   Pulse 83   Temp 98.3 F (36.8 C) (Oral)   Resp 16   SpO2 98%  Gen:   Awake, no distress   Resp:  Normal effort  MSK:   Moves extremities without difficulty  Other:  Heart sounds normal, no rubs murmurs.  Rate normal.  Blood pressure elevated.  Medical Decision Making  Medically screening exam initiated at 9:52 AM.  Appropriate orders placed.  [name] was informed that the remainder of the evaluation will be completed by another provider, this initial triage assessment does not replace that evaluation, and the importance of remaining in the ED until their evaluation is complete.  Cardiac labs ordered   Delight Ovens 10/27/21 8675    Rozelle Logan, DO 10/27/21 1438

## 2021-10-27 NOTE — ED Triage Notes (Signed)
Pt arrived via POV, c/o sternal chest pain x2 days, non reproducible. Non radiating. Started at rest. Denies any SOB.

## 2021-12-23 LAB — HEMOGLOBIN A1C: Hemoglobin A1C: 10.7

## 2022-02-03 LAB — HM MAMMOGRAPHY

## 2022-03-17 IMAGING — DX DG CHEST 1V PORT
1 series · 1 of 1 positions shown · non-contrast
Comparison: None.

CLINICAL DATA: Chest pain/palpitations

EXAM:
PORTABLE CHEST 1 VIEW

[chest ap]
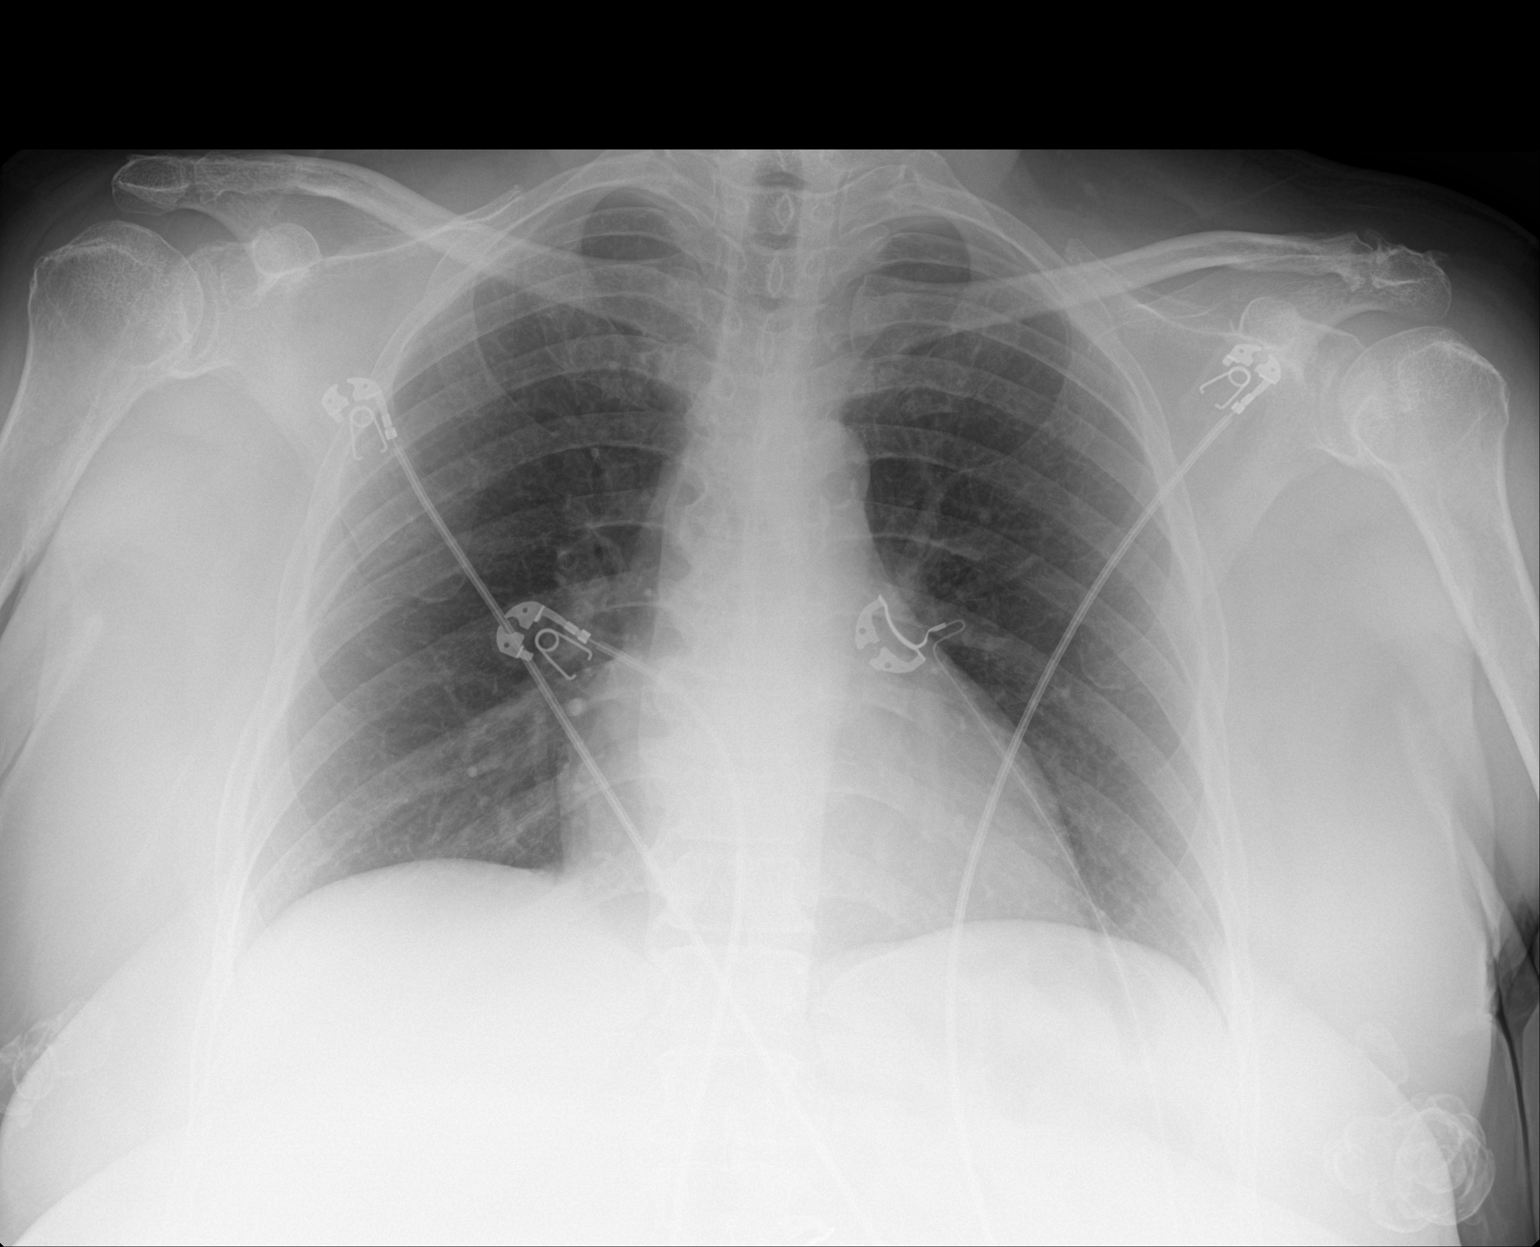

[1 of 1 positions shown; findings below may reference images not displayed]

FINDINGS: The heart size and mediastinal contours are within normal limits.
Both lungs are clear. No pleural effusion or pneumothorax. The
visualized skeletal structures are unremarkable.
IMPRESSION: No acute process in the chest.

## 2022-05-18 ENCOUNTER — Encounter: Payer: PPO | Admitting: Nurse Practitioner

## 2022-05-24 ENCOUNTER — Telehealth: Payer: Self-pay | Admitting: Nurse Practitioner

## 2022-05-24 ENCOUNTER — Encounter: Payer: Self-pay | Admitting: Nurse Practitioner

## 2022-05-24 ENCOUNTER — Other Ambulatory Visit: Payer: PPO

## 2022-05-24 ENCOUNTER — Ambulatory Visit (INDEPENDENT_AMBULATORY_CARE_PROVIDER_SITE_OTHER): Payer: PPO | Admitting: Nurse Practitioner

## 2022-05-24 VITALS — BP 134/80 | HR 80 | Temp 96.7°F | Ht 59.0 in | Wt 179.2 lb

## 2022-05-24 DIAGNOSIS — I10 Essential (primary) hypertension: Secondary | ICD-10-CM

## 2022-05-24 DIAGNOSIS — E1169 Type 2 diabetes mellitus with other specified complication: Secondary | ICD-10-CM | POA: Diagnosis not present

## 2022-05-24 DIAGNOSIS — Z789 Other specified health status: Secondary | ICD-10-CM | POA: Insufficient documentation

## 2022-05-24 DIAGNOSIS — I69322 Dysarthria following cerebral infarction: Secondary | ICD-10-CM | POA: Diagnosis not present

## 2022-05-24 DIAGNOSIS — Z794 Long term (current) use of insulin: Secondary | ICD-10-CM

## 2022-05-24 DIAGNOSIS — E785 Hyperlipidemia, unspecified: Secondary | ICD-10-CM

## 2022-05-24 DIAGNOSIS — R739 Hyperglycemia, unspecified: Secondary | ICD-10-CM

## 2022-05-24 LAB — BASIC METABOLIC PANEL
BUN: 11 mg/dL (ref 6–23)
CO2: 31 mEq/L (ref 19–32)
Calcium: 9.9 mg/dL (ref 8.4–10.5)
Chloride: 98 mEq/L (ref 96–112)
Creatinine, Ser: 0.7 mg/dL (ref 0.40–1.20)
GFR: 98.29 mL/min (ref >60.00)
Glucose, Bld: 186 mg/dL — ABNORMAL HIGH (ref 70–99)
Potassium: 4.4 mEq/L (ref 3.5–5.1)
Sodium: 135 mEq/L (ref 135–145)

## 2022-05-24 LAB — MICROALBUMIN / CREATININE URINE RATIO
Creatinine,U: 16.5 mg/dL
Microalb Creat Ratio: 4.2 mg/g (ref 0.0–30.0)
Microalb, Ur: <0.7 mg/dL (ref 0.0–1.9)

## 2022-05-24 MED ORDER — OZEMPIC (0.25 OR 0.5 MG/DOSE) 2 MG/1.5ML ~~LOC~~ SOPN: 0.2500 mg | PEN_INJECTOR | SUBCUTANEOUS | 0 refills | Status: DC

## 2022-05-24 MED ORDER — FREESTYLE LIBRE 2 SENSOR MISC: 1.0000 [IU] | 3 refills | Status: DC

## 2022-05-24 MED ORDER — LOSARTAN POTASSIUM-HCTZ 100-12.5 MG PO TABS: 1.0000 | ORAL_TABLET | Freq: Every day | ORAL | 0 refills | Status: DC

## 2022-05-24 MED ORDER — AMLODIPINE BESYLATE 10 MG PO TABS: 10.0000 mg | ORAL_TABLET | Freq: Every day | ORAL | 0 refills | Status: DC

## 2022-05-24 NOTE — Progress Notes (Signed)
New patient visit  Patient: [name]   DOB: 05/04/1966   56 y.o. Female  MRN: [id number] Visit Date: 05/24/2022  Subjective:    Chief Complaint  Patient presents with   [place] Pt,est care Has concerns about what medications she should be taking Requesting records from [place] is a 56 y.o. female who presents today as a new patient to establish care.  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 Advised to schedule appt with ophthalmologist: [facility] Repeat hgbA1c, BMP amd urine microalbumin F/up in 2weeks with glucose reading  Hyperlipidemia associated with type 2 diabetes mellitus ([place]) LDL not at goal Lipid panel completed 12/2021: LDL 171, HDL 43, TC 240 Unable to tolerate livalo, and lipitor (muscle weakness and joint pain). ASCVD risk at 18.6% Agreed to lipid clinic referral to discuss use of PCSK9 inhibitor  Essential hypertension Labile BP due to medication non compliance. She states "I don't like taking medication" Use of salt containing seasoning on meals Home BP readings: 140s-150s/80s Current use of losartan/hctz and amlodipine BP Readings from Last 3 Encounters:  05/24/22 134/80  10/27/21 (!) 175/80  06/01/20 140/85   Repeat BMP Advised about the importance of medication compliance and DASH diet. Advised about possible complications from uncontrolled BP. Maintain current medication doses F/up in 2weeks  Dysarthria as late effect of cerebrovascular accident (CVA) Reports persistent difficulty with speech: pronounces words wrong and sometimes difficulty expressing herself. Onset after CVA in 2015. initally she was unable to talk x 39month, but  speech improved with speech therapy post hospitalization.   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 Advised to schedule appt with ophthalmologist: [facility] Repeat hgbA1c, BMP amd urine microalbumin F/up in 2weeks with glucose reading  Hyperlipidemia associated with type 2 diabetes mellitus ([place]) LDL not at goal Lipid panel completed 12/2021: LDL 171, HDL 43, TC 240 Unable to tolerate livalo, and lipitor (muscle weakness and joint pain). ASCVD risk at 18.6% Agreed to lipid clinic referral to discuss use of PCSK9 inhibitor  Essential hypertension Labile BP due to medication non compliance. She states "I don't like taking medication" Use of salt containing seasoning on meals Home BP readings: 140s-150s/80s Current use of losartan/hctz and amlodipine BP Readings from Last 3 Encounters:  05/24/22 134/80  10/27/21 (!) 175/80  06/01/20 140/85   Repeat BMP Advised about the importance of medication compliance and DASH diet. Advised about possible complications from uncontrolled BP. Maintain current medication doses F/up in 2weeks  Dysarthria as late effect of cerebrovascular accident (CVA) Reports persistent difficulty with speech: pronounces words wrong and sometimes difficulty expressing herself. Onset after CVA in 2015. initally she was unable to talk x 50month, but speech improved with speech therapy post hospitalization.   entered referral to speech therapy  Past Medical History:  Diagnosis Date  Diabetes mellitus without complication ([place]) 0141   previously on [place]    Gestational diabetes 2009    Hypertension 09/07/2014   Stroke ([place]) 09/07/2014   R side.  weakness in upper arm. No deficit in leg. deficit in speech. A littel bit in coordination.    Social History   Tobacco Use   Smoking status: Never   Smokeless tobacco: Never  Vaping Use   Vaping Use: Never used  Substance Use Topics   Alcohol use: No   Drug use: Yes    Types: Marijuana         09/21/2018    9:01 AM 11/01/2018    8:24 AM 11/16/2018   10:00 AM 05/31/2020   10:49 PM 10/27/2021    9:45 AM  Fall Risk  Patient Fall Risk Level Low fall risk Low fall risk Low fall risk Low fall risk Low fall risk   Outpatient Medications Prior to Visit  Medication Sig   aspirin EC 81 MG tablet Take 1 tablet (81 mg total) by mouth daily.   aspirin EC 81 MG tablet Take 162 mg by mouth daily. Swallow whole.   Blood Glucose Monitoring Suppl (TRUE METRIX METER) w/Device KIT 1 each by Does not apply route as needed.   Elastic Bandages & Supports (FINGER SPLINT) MISC 1 each by Does not apply route daily. On Left thumb and ring finger   glucose blood (TRUE METRIX BLOOD GLUCOSE TEST) test strip 1 each by Other route 2 (two) times daily.   TRUEPLUS LANCETS 28G MISC 1 each by Does not apply route 2 (two) times daily.   [DISCONTINUED] amLODipine (NORVASC) 10 MG tablet Take 1 tablet (10 mg total) by mouth daily.   [DISCONTINUED] LANTUS 100 UNIT/ML injection Inject 25 Units into the skin daily.   [DISCONTINUED] losartan-hydrochlorothiazide (HYZAAR) 100-12.5 MG tablet Take 1 tablet by mouth daily.   [DISCONTINUED] lovastatin (MEVACOR) 40 MG tablet Take 1 tablet (40 mg total) by mouth at bedtime.   [DISCONTINUED] metoprolol succinate (TOPROL-XL) 25 MG 24 hr tablet Take 25 mg by mouth daily.   [DISCONTINUED] sitaGLIPtin-metformin (JANUMET) 50-1000 MG tablet Take 1 tablet by mouth 2 (two) times daily with a meal.   [DISCONTINUED] VICTOZA 18 MG/3ML SOPN Inject 6 mg into the skin daily.   [DISCONTINUED] cyclobenzaprine (FLEXERIL) 10 MG tablet Take 1 tablet (10 mg total) by mouth 3 (three) times daily as  needed for muscle spasms. (Patient not taking: Reported on 05/24/2022)   [DISCONTINUED] naproxen (NAPROSYN) 375 MG tablet Take 1 tablet twice daily as needed for pain. (Patient not taking: Reported on 05/24/2022)   No facility-administered medications prior to visit.   Allergies  Allergen Reactions   Lipitor [Atorvastatin] Other (See Comments)    Made me feel woozy   Statins     Patient Care Team: Izan Miron, Charlene Brooke, NP as PCP - General (Internal Medicine) Servando Salina, MD as Consulting Physician (Obstetrics and Gynecology) Darliss Cheney, CNM as Midwife (Obstetrics and Gynecology)  Review of Systems  Last metabolic panel Lab Results  Component Value Date   GLUCOSE 238 (H) 10/27/2021   NA 135 10/27/2021   K 4.0 10/27/2021   CL 102 10/27/2021   CO2 27 10/27/2021   BUN 17 10/27/2021   CREATININE 0.69 10/27/2021   GFRNONAA >60 10/27/2021   CALCIUM 9.8 10/27/2021   PROT 6.9 11/16/2018   ALBUMIN 4.1 11/16/2018   BILITOT 0.4 11/16/2018   ALKPHOS 94 11/16/2018   AST 17 11/16/2018   ALT 22 11/16/2018  ANIONGAP 6 10/27/2021   Last lipids Lab Results  Component Value Date   CHOL 200 (H) 03/02/2017   HDL 36 (L) 03/02/2017   LDLCALC 138 (H) 03/02/2017   TRIG 128 03/02/2017   CHOLHDL 5.6 (H) 03/02/2017   Last hemoglobin A1c Lab Results  Component Value Date   HGBA1C 10.7 12/23/2021       Objective:  BP 134/80 (BP Location: Left Arm, Patient Position: Sitting, Cuff Size: Normal)   Pulse 80   Temp (!) 96.7 F (35.9 C) (Temporal)   Ht 4\' 11"  (1.499 m)   Wt 179 lb 3.2 oz (81.3 kg)   SpO2 99%   BMI 36.19 kg/m     BP Readings from Last 3 Encounters:  05/24/22 134/80  10/27/21 (!) 175/80  06/01/20 140/85   Wt Readings from Last 3 Encounters:  05/24/22 179 lb 3.2 oz (81.3 kg)  05/31/20 179 lb (81.2 kg)  03/13/19 185 lb (83.9 kg)   Physical Exam Cardiovascular:     Rate and Rhythm: Normal rate.     Pulses: Normal pulses.  Pulmonary:     Effort:  Pulmonary effort is normal.  Musculoskeletal:     Right lower leg: No edema.     Left lower leg: No edema.  Neurological:     Mental Status: She is alert and oriented to person, place, and time.     Cranial Nerves: Dysarthria present. No facial asymmetry.     Motor: Weakness present.     Gait: Gait is intact.     Comments: Right forearm and hand (spastic movement and limited ROM)   Results for orders placed or performed in visit on 05/24/22  HM MAMMOGRAPHY  Result Value Ref Range   HM Mammogram 0-4 Bi-Rad 0-4 Bi-Rad, Self Reported Normal  Cologuard  Result Value Ref Range   Cologuard Negative Negative  Hemoglobin A1c  Result Value Ref Range   Hemoglobin A1C 10.7       Assessment & Plan:    Problem List Items Addressed This Visit       Cardiovascular and Mediastinum   Essential hypertension - Primary    Labile BP due to medication non compliance. She states "I don't like taking medication" Use of salt containing seasoning on meals Home BP readings: 140s-150s/80s Current use of losartan/hctz and amlodipine BP Readings from Last 3 Encounters:  05/24/22 134/80  10/27/21 (!) 175/80  06/01/20 140/85   Repeat BMP Advised about the importance of medication compliance and DASH diet. Advised about possible complications from uncontrolled BP. 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 Advised to schedule  appt with ophthalmologist: [facility] Repeat hgbA1c, BMP amd urine microalbumin F/up in 2weeks with glucose reading       Relevant Medications   aspirin EC 81 MG tablet   Continuous Blood Gluc Sensor (FREESTYLE LIBRE 2 SENSOR) MISC  Semaglutide,0.25 or 0.5MG /DOS, (OZEMPIC, 0.25 OR 0.5 MG/DOSE,) 2 MG/1.5ML SOPN   losartan-hydrochlorothiazide (HYZAAR) 100-12.5 MG tablet   Other Relevant Orders   Hemoglobin A1c   Microalbumin / creatinine urine ratio   Basic metabolic panel   Referral to Nutrition and Diabetes Services   Hyperlipidemia associated with type 2 diabetes mellitus ([place])    LDL not at goal Lipid panel completed 12/2021: LDL 171, HDL 43, TC 240 Unable to tolerate livalo, and lipitor (muscle weakness and joint pain). ASCVD risk at 18.6% Agreed to lipid clinic referral to discuss use of PCSK9 inhibitor       Relevant Medications   aspirin EC 81 MG tablet   Semaglutide,0.25 or 0.5MG /DOS, (OZEMPIC, 0.25 OR 0.5 MG/DOSE,) 2 MG/1.5ML SOPN   losartan-hydrochlorothiazide (HYZAAR) 100-12.5 MG tablet   amLODipine (NORVASC) 10 MG tablet   Other Relevant Orders   AMB Referral to [facility]     Other   Dysarthria as late effect of cerebrovascular accident (CVA)    Reports persistent difficulty with speech: pronounces words wrong and sometimes difficulty expressing herself. Onset after CVA in 2015. initally she was unable to talk x 89month, but speech improved with speech therapy post hospitalization.   entered referral to speech therapy       Relevant Orders   Ambulatory referral to Speech Therapy   Statin intolerance   Relevant Orders   AMB Referral to [facility]   Return in about 2 weeks (around 06/07/2022) for DM.     Wilfred Lacy, NP

## 2022-05-24 NOTE — Telephone Encounter (Signed)
[facility]  is calling in needing a verbal to get pt Semaglutide,0.25 or 0.5MG /DOS, (OZEMPIC, 0.25 OR 0.5 MG/DOSE,) 2 MG/1.5ML SOPN [[id number]].  The pharmacist says it needs to state 2mg /59mls.  [facility] [address], [place] - [address]  [address], [place]  Phone:  [phone]  Fax:  [phone]

## 2022-05-24 NOTE — Assessment & Plan Note (Addendum)
Reports persistent difficulty with speech: pronounces words wrong and sometimes difficulty expressing herself. Onset after CVA in 2015. initally she was unable to talk x 86month, but speech improved with speech therapy post hospitalization.   entered referral to speech therapy

## 2022-05-24 NOTE — Assessment & Plan Note (Addendum)
LDL not at goal Lipid panel completed 12/2021: LDL 171, HDL 43, TC 240 Unable to tolerate livalo, and lipitor (muscle weakness and joint pain). ASCVD risk at 18.6% Agreed to lipid clinic referral to discuss use of PCSK9 inhibitor

## 2022-05-24 NOTE — Telephone Encounter (Signed)
Spoke W/ Pharmacist (Ry at [facility]). Gave the OK to make the change to her Ozempic. Now it is 2mg /35ml due to manufacture discontinuing the 2mg /1.51ml dosage.

## 2022-05-24 NOTE — Assessment & Plan Note (Addendum)
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 Advised to schedule appt with ophthalmologist: [facility] Repeat hgbA1c, BMP amd urine microalbumin F/up in 2weeks with glucose reading

## 2022-05-24 NOTE — Patient Instructions (Addendum)
Go to lab Call office with dose of ozempic Bring glucose reading to next appt Schedule appt with opthalomogist. You will be contacted to schedule appt with nutritionist

## 2022-05-24 NOTE — Telephone Encounter (Signed)
[place] from [facility] stated that mrs Camilo is not a pt of theirs

## 2022-05-24 NOTE — Telephone Encounter (Signed)
noted 

## 2022-05-24 NOTE — Assessment & Plan Note (Signed)
Labile BP due to medication non compliance. She states "I don't like taking medication" Use of salt containing seasoning on meals Home BP readings: 140s-150s/80s Current use of losartan/hctz and amlodipine BP Readings from Last 3 Encounters:  05/24/22 134/80  10/27/21 (!) 175/80  06/01/20 140/85   Repeat BMP Advised about the importance of medication compliance and DASH diet. Advised about possible complications from uncontrolled BP. Maintain current medication doses F/up in 2weeks

## 2022-05-25 ENCOUNTER — Other Ambulatory Visit: Payer: Self-pay

## 2022-05-25 ENCOUNTER — Encounter: Payer: Self-pay | Admitting: Family

## 2022-05-25 LAB — HEMOGLOBIN A1C
Hgb A1c MFr Bld: 13.4 % of total Hgb — ABNORMAL HIGH (ref <5.7)
Mean Plasma Glucose: 338 mg/dL
eAG (mmol/L): 18.7 mmol/L

## 2022-05-25 MED ORDER — METFORMIN HCL ER 500 MG PO TB24: 500.0000 mg | ORAL_TABLET | Freq: Every day | ORAL | 5 refills | Status: DC

## 2022-05-25 MED ORDER — EMPAGLIFLOZIN 10 MG PO TABS
10.0000 mg | ORAL_TABLET | Freq: Every day | ORAL | 5 refills | Status: DC
  Filled 2022-05-25: qty 30, 30d supply, fill #0

## 2022-05-25 MED ORDER — PEN NEEDLES 31G X 6 MM MISC: 1.0000 "application " | Freq: Every day | 1 refills | Status: DC

## 2022-05-25 MED ORDER — EMPAGLIFLOZIN 10 MG PO TABS: 10.0000 mg | ORAL_TABLET | Freq: Every day | ORAL | 5 refills | Status: DC

## 2022-05-25 MED ORDER — PEN NEEDLES 31G X 6 MM MISC
1.0000 "application " | Freq: Every day | 1 refills | Status: DC
  Filled 2022-05-25: qty 90, fill #0

## 2022-05-25 MED ORDER — METFORMIN HCL ER 500 MG PO TB24
500.0000 mg | ORAL_TABLET | Freq: Every day | ORAL | 5 refills | Status: DC
  Filled 2022-05-25: qty 30, 30d supply, fill #0

## 2022-05-25 MED ORDER — LANTUS SOLOSTAR 100 UNIT/ML ~~LOC~~ SOPN: PEN_INJECTOR | SUBCUTANEOUS | 0 refills | Status: DC

## 2022-05-25 MED ORDER — LANTUS SOLOSTAR 100 UNIT/ML ~~LOC~~ SOPN
PEN_INJECTOR | SUBCUTANEOUS | 0 refills | Status: DC
  Filled 2022-05-25: qty 15, fill #0

## 2022-05-25 NOTE — Assessment & Plan Note (Signed)
Uncontrolled DM with hgbA1c at 13. Start lantus 10units at bedtime, increase by 3units every 5days if fatsing glucose >150. Start metformin 500mg  XR daily and jardiance 10mg  daily. Take with food. F/up as discussed. Bring glucose reading to appt

## 2022-05-25 NOTE — Addendum Note (Signed)
Addended by: Alysia Penna L on: 05/25/2022 03:50 PM   Modules accepted: Orders

## 2022-05-26 ENCOUNTER — Telehealth: Payer: Self-pay | Admitting: Nurse Practitioner

## 2022-05-26 NOTE — Telephone Encounter (Signed)
[place] Citizen from [facility] [phone] called about Pts [place] She needs a maximum daily dose listed for insurance to approve. Please call to clarify.

## 2022-05-26 NOTE — Telephone Encounter (Signed)
Called & spoke w/ pharmacy. Needed day supply in order for insurance can approve it. Pharmacist Spoke w/ [place] & voiced understanding.

## 2022-05-27 ENCOUNTER — Telehealth: Payer: Self-pay | Admitting: Nurse Practitioner

## 2022-05-27 NOTE — Telephone Encounter (Signed)
Pt called stating this is a new  med for her and she would like to ask some questions as she didn't know it was being prescribed.  empagliflozin (JARDIANCE) 10 MG TABS tablet  Call back # [phone]

## 2022-05-27 NOTE — Telephone Encounter (Signed)
Pt called & had concerns about why jardiance was added to her medications. Adv, being that her A1C is at 13, [name] made changes her mediatation to try and bring that A1C down. She is to call back if she has any other questions or concerns.

## 2022-06-01 NOTE — Progress Notes (Signed)
error    This encounter was created in error - please disregard.

## 2022-06-07 ENCOUNTER — Encounter: Payer: Self-pay | Admitting: Speech Pathology

## 2022-06-07 ENCOUNTER — Ambulatory Visit: Payer: PPO | Attending: Nurse Practitioner | Admitting: Speech Pathology

## 2022-06-07 DIAGNOSIS — R4701 Aphasia: Secondary | ICD-10-CM | POA: Diagnosis present

## 2022-06-07 NOTE — Therapy (Signed)
OUTPATIENT SPEECH LANGUAGE PATHOLOGY EVALUATION   Patient Name: [name] MRN: [id number] DOB:10/15/1966, 56 y.o., female Today's Date: 06/08/2022  PCP: Anne Ng, NP REFERRING PROVIDER: Anne Ng, NP   End of Session - 06/08/22 1221     Visit Number 1    Number of Visits 9    Date for SLP Re-Evaluation 08/08/22    SLP Start Time 1238    SLP Stop Time  1323    SLP Time Calculation (min) 45 min    Activity Tolerance Patient tolerated treatment well             Past Medical History:  Diagnosis Date   Diabetes mellitus without complication (HCC) 2013   previously on victoza    Gestational diabetes 2009    Hypertension 09/07/2014   Stroke (HCC) 09/07/2014   R side. weakness in upper arm. No deficit in leg. deficit in speech. A littel bit in coordination.    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   Aphasia, late effect of cerebrovascular disease 03/14/2017   Dysarthria as late effect of cerebrovascular accident (CVA) 03/14/2017   Contracture of joint of finger of right hand 02/02/2017   Weakness of both hands 02/02/2017   History of CVA with residual deficit 03/02/2016   CVA (cerebral vascular accident) (HCC) 09/29/2015   Essential hypertension 09/29/2015   DM (diabetes mellitus) (HCC) 09/29/2015   Eczema 09/29/2015    ONSET DATE: Referred on  05/24/22 (CVA 2015)   REFERRING DIAG: I01.655 (ICD-10-CM) - Dysarthria as late effect of  cerebrovascular accident (CVA)  THERAPY DIAG:  Aphasia - Plan: SLP plan of care cert/re-cert  Rationale for Evaluation and Treatment Rehabilitation  SUBJECTIVE:   PATIENT HISTORY: Hyperlipidemia, DM, Hypertension, CVA                                                                                                                                                                                                           PAIN:  Are you having pain?  No    FALLS: Has patient fallen in last 6 months? No  LIVING ENVIRONMENT: Lives with: lives with their family Lives in: House/apartment  PLOF: Independent  HPI: Pt is a 56 yo female with "persistent difficulty with speech: prounouncing words wrong and sometimes difficulty expressing herself". Onset after CVA.    PATIENT GOALS Expressive communication, reading  OBJECTIVE:   DIAGNOSTIC FINDINGS: No MRI/CT available from 2015 CVA.   COGNITION: Overall cognitive status: Within functional limits for tasks assessed  AUDITORY COMPREHENSION: Overall auditory comprehension: Appears intact YES/NO questions: Appears intact Commands: Appears intact; requires repetition with multistep Conversation: Complex Interfering components:  NA Effective technique: extra processing time and repetition/stressing words  VISUAL RECOGNITION/DISCRIMINATION: Appears intact  READING COMPREHENSION: Appears intact; though pt reports difficulty with mixing up words while reading - will provide strategies.  EXPRESSION: verbal  VERBAL EXPRESSION: Level of generative/spontaneous verbalization: conversation Automatic speech: Intact Repetition: Appears intact Naming: Confrontation: 76-100% Pragmatics: Appears intact Comments: anomia Interfering components:  NA Effective technique:  extra time Non-verbal means of communication: N/A  WRITTEN EXPRESSION: Dominant hand: right - uses left due to limited UE movement  Written expression: Impaired: moderately  complex   MOTOR SPEECH: Overall motor speech: Appears intact   STANDARDIZED ASSESSMENTS:  WAB-Bedside [employer] Aphasia Battery - Bedside Record Form   Spontaneous Speech - Content: 10/10 Spontaneous Speech- Fluency: 9/10 Auditory Verbal Comprehension- Yes/No Questions: 10/10 Sequential Commands: 10/10  - reports this was difficult Repetition: 10/10 Object naming: 10/10 Reading: 10/10 Writing: 9/10 Apraxia (Optional): NA      PATIENT EDUCATION: Education details: aphasia Person educated: Patient Education method: Explanation, Demonstration, and Handouts Education comprehension: verbalized understanding and needs further education   ASSESSMENT:  CLINICAL IMPRESSION: Pt is a 56 yo female who presents to ST OP for evaluation post CVA in 2015. Pt endorses re: "thinking of a word but it won't come out", spelling difficulty, and "jumbled words while reading". Pt was assessed using WAB-Bedside. See above for details. SLP observed anomia in conversation which is c/b hesitations and word finding difficulty and occasional spelling errors. Pt reported word finding is worse when she is upset or stressed. She completed Communicative Effectiveness Survey and scored a 16 indicating she believes her communication is not very efficient in a variety of situations. SLP rec skilled ST services to address strategies for anomia and reading comprehension.    OBJECTIVE IMPAIRMENTS include aphasia.  These impairments are limiting patient from maximizing functional communication in daily activities.   Factors affecting potential to achieve goals and functional outcome are  NA . Patient will benefit from skilled SLP services to address above impairments and improve overall function.  REHAB POTENTIAL: Fair due to extensive time post CVA  CLINICAL DECISION MAKING: Stable/uncomplicated  EVALUATION COMPLEXITY: Low   GOALS: Goals reviewed with patient? Yes  SHORT TERM GOALS:  Pt will recall 3 word  finding strategies to used in cases of anomia with minA verbal cues.  Baseline:  Target date: 07/06/2022   Goal status: INITIAL  2.  Pt will recall 2+ reading strategies to assist with comprehension during preferred book reading with minA verbal cues.  Baseline:  Target date: 07/06/2022   Goal status: INITIAL  LONG TERM GOALS:  Pt will demonstrate use of word finding strategies in cases of anomia during a 15 min independently. Baseline:  Target date: 08/03/2022   Goal status: INITIAL  2.  Pt will report successful use of reading strategies during book reading at home.  Baseline:  Target date:  08/03/2022   Goal status: INITIAL   PLAN: SLP FREQUENCY: 1x/week  SLP DURATION: 8 weeks  PLANNED INTERVENTIONS: Language facilitation, Cueing hierachy, Internal/external aids, Functional tasks, Multimodal communication approach, SLP instruction and feedback, Compensatory strategies, and Patient/family education     [place], CCC-SLP 06/08/2022, 12:22 PM

## 2022-06-09 ENCOUNTER — Encounter: Payer: Self-pay | Admitting: Nurse Practitioner

## 2022-06-09 ENCOUNTER — Ambulatory Visit (INDEPENDENT_AMBULATORY_CARE_PROVIDER_SITE_OTHER): Payer: PPO | Admitting: Nurse Practitioner

## 2022-06-09 DIAGNOSIS — Z794 Long term (current) use of insulin: Secondary | ICD-10-CM | POA: Diagnosis not present

## 2022-06-09 DIAGNOSIS — E1165 Type 2 diabetes mellitus with hyperglycemia: Secondary | ICD-10-CM

## 2022-06-09 MED ORDER — METFORMIN HCL ER 500 MG PO TB24: 500.0000 mg | ORAL_TABLET | Freq: Every day | ORAL | 1 refills | Status: DC

## 2022-06-09 MED ORDER — LANTUS SOLOSTAR 100 UNIT/ML ~~LOC~~ SOPN
15.0000 [IU] | PEN_INJECTOR | Freq: Every day | SUBCUTANEOUS | 2 refills | Status: DC
Start: 2022-06-09 — End: 2022-06-13

## 2022-06-09 MED ORDER — EMPAGLIFLOZIN 10 MG PO TABS: 10.0000 mg | ORAL_TABLET | Freq: Every day | ORAL | 1 refills | Status: DC

## 2022-06-09 MED ORDER — OZEMPIC (0.25 OR 0.5 MG/DOSE) 2 MG/1.5ML ~~LOC~~ SOPN: 0.5000 mg | PEN_INJECTOR | SUBCUTANEOUS | 0 refills | Status: DC

## 2022-06-09 NOTE — Assessment & Plan Note (Signed)
AM glucose: 110-120 PM glucose: 150-200 Reports one episode of hypoglycemia: 84, symptomatic. She skipped lunch before exercise routine. Advised to maintain per day with protein, complex carbs and vegetables. Increase ozempic to 0.5mg  weekly Maintain lantus dose at 15units, metformin and jardiance. F/up in 39month

## 2022-06-09 NOTE — Progress Notes (Signed)
Established Patient Visit  Patient: [name]   DOB: Nov 14, 1966   56 y.o. Female  MRN: [id number] Visit Date: 06/09/2022  Subjective:    Chief Complaint  Patient presents with   Office Visit    DM F/u Checks BP & blood sugars Every morning, has been ranging around 124/84. Blood sugars have been around 96-98. No concerns    HPI DM (diabetes mellitus) ([place]) AM glucose: 110-120 PM glucose: 150-200 Reports one episode of hypoglycemia: 84, symptomatic. She skipped lunch before exercise routine. Advised to maintain 52meals per day with protein, complex carbs and vegetables. Increase ozempic to 0.5mg  weekly Maintain lantus dose at 15units, metformin and jardiance. F/up in 72month  BP Readings from Last 3 Encounters:  06/09/22 120/80  05/24/22 134/80  10/27/21 (!) 175/80    Wt Readings from Last 3 Encounters:  06/09/22 176 lb 3.2 oz (79.9 kg)  05/24/22 179 lb 3.2 oz (81.3 kg)  05/31/20 179 lb (81.2 kg)    Reviewed medical, surgical, and social history today  Medications: Outpatient Medications Prior to Visit  Medication Sig   amLODipine (NORVASC) 10 MG tablet Take 1 tablet (10 mg total) by mouth daily.   aspirin EC 81 MG tablet Take 1 tablet (81 mg total) by mouth daily.   aspirin EC 81 MG tablet Take 162 mg by mouth daily. Swallow whole.   Blood Glucose Monitoring Suppl (TRUE METRIX METER) w/Device KIT 1 each by Does not apply route as needed.   Continuous Blood Gluc Sensor (FREESTYLE LIBRE 2 SENSOR) MISC 1 Units by Does not apply route every 14 (fourteen) days.   Elastic Bandages & Supports (FINGER SPLINT) MISC 1 each by Does not apply route daily. On Left thumb and ring finger   glucose blood (TRUE METRIX BLOOD GLUCOSE TEST) test strip 1 each by Other route 2 (two) times daily.   Insulin Pen Needle (PEN NEEDLES) 31G X 6 MM MISC 1 application. by Does not apply route at bedtime.   losartan-hydrochlorothiazide (HYZAAR) 100-12.5 MG tablet Take 1 tablet by  mouth daily.   TRUEPLUS LANCETS 28G MISC 1 each by Does not apply route 2 (two) times daily.   [DISCONTINUED] empagliflozin (JARDIANCE) 10 MG TABS tablet Take 1 tablet (10 mg total) by mouth daily before breakfast.   [DISCONTINUED] insulin glargine (LANTUS SOLOSTAR) 100 UNIT/ML Solostar Pen Start with 10units at bedtime, increase by 3units every 5days if fasting glucose >150   [DISCONTINUED] metFORMIN (GLUCOPHAGE-XR) 500 MG 24 hr tablet Take 1 tablet (500 mg total) by mouth daily with breakfast.   [DISCONTINUED] Semaglutide,0.25 or 0.5MG /DOS, (OZEMPIC, 0.25 OR 0.5 MG/DOSE,) 2 MG/1.5ML SOPN Inject 0.25 mg into the skin once a week.   No facility-administered medications prior to visit.   Reviewed past medical and social history.   ROS per HPI above      Objective:  BP 120/80 (BP Location: Left Arm, Patient Position: Sitting, Cuff Size: Small)   Pulse 95   Temp (!) 96.9 F (36.1 C) (Temporal)   Ht 4\' 11"  (1.499 m)   Wt 176 lb 3.2 oz (79.9 kg)   SpO2 98%   BMI 35.59 kg/m      Physical Exam Constitutional:      Appearance: She is obese.  Cardiovascular:     Rate and Rhythm: Normal rate and regular rhythm.     Pulses: Normal pulses.     Heart sounds: Normal heart sounds.  Pulmonary:     Effort: Pulmonary effort is normal.     Breath sounds: Normal breath sounds.  Musculoskeletal:     Right lower leg: No edema.     Left lower leg: No edema.  Neurological:     Mental Status: She is alert and oriented to person, place, and time.     No results found for any visits on 06/09/22.    Assessment & Plan:    Problem List Items Addressed This Visit       Endocrine   DM (diabetes mellitus) ([place])    AM glucose: 110-120 PM glucose: 150-200 Reports one episode of hypoglycemia: 84, symptomatic. She skipped lunch before exercise routine. Advised to maintain 84meals per day with protein, complex carbs and vegetables. Increase ozempic to 0.5mg  weekly Maintain lantus dose at 15units,  metformin and jardiance. F/up in 1month      Relevant Medications   Semaglutide,0.25 or 0.5MG /DOS, (OZEMPIC, 0.25 OR 0.5 MG/DOSE,) 2 MG/1.5ML SOPN   insulin glargine (LANTUS SOLOSTAR) 100 UNIT/ML Solostar Pen   metFORMIN (GLUCOPHAGE-XR) 500 MG 24 hr tablet   empagliflozin (JARDIANCE) 10 MG TABS tablet   Return in about 4 weeks (around 07/07/2022) for DM and HTN.     Wilfred Lacy, NP

## 2022-06-09 NOTE — Patient Instructions (Signed)
Schedule appt for diabetic eye exam Send weekly average glucose readings via mychart in 2weeks. Check glucose AM, noon and PM Maintain lantus dose Increased ozempic dose to 0.5mg  weekly.

## 2022-06-13 ENCOUNTER — Other Ambulatory Visit: Payer: Self-pay | Admitting: Nurse Practitioner

## 2022-06-13 DIAGNOSIS — E1165 Type 2 diabetes mellitus with hyperglycemia: Secondary | ICD-10-CM

## 2022-06-13 MED ORDER — INSULIN GLARGINE-YFGN 100 UNIT/ML ~~LOC~~ SOPN: 15.0000 [IU] | PEN_INJECTOR | Freq: Every day | SUBCUTANEOUS | 0 refills | Status: DC

## 2022-06-14 ENCOUNTER — Ambulatory Visit: Payer: PPO | Admitting: Speech Pathology

## 2022-06-14 ENCOUNTER — Encounter: Payer: Self-pay | Admitting: Speech Pathology

## 2022-06-14 DIAGNOSIS — R4701 Aphasia: Secondary | ICD-10-CM

## 2022-06-14 NOTE — Therapy (Signed)
OUTPATIENT SPEECH LANGUAGE PATHOLOGY TREATMENT NOTE   Patient Name: [name] MRN: [id number] DOB:1966/12/08, 56 y.o., female Today's Date: 06/14/2022  PCP: Anne Ng, NP REFERRING PROVIDER: Anne Ng, NP  END OF SESSION:   End of Session - 06/14/22 1405     Visit Number 2    Number of Visits 9    Date for SLP Re-Evaluation 08/08/22    SLP Start Time 1400    SLP Stop Time  1440    SLP Time Calculation (min) 40 min    Activity Tolerance Patient tolerated treatment well             Past Medical History:  Diagnosis Date   Diabetes mellitus without complication (HCC) 2013   previously on victoza    Gestational diabetes 2009    Hypertension 09/07/2014   Stroke (HCC) 09/07/2014   R side. weakness in upper arm. No deficit in leg. deficit in speech. A littel bit in coordination.    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   Aphasia, late effect of cerebrovascular disease 03/14/2017   Dysarthria as late effect of cerebrovascular accident (CVA) 03/14/2017   Contracture of joint of finger of right hand 02/02/2017   Weakness of both hands 02/02/2017   History of CVA with residual deficit 03/02/2016   CVA (cerebral vascular accident) (HCC) 09/29/2015   Essential hypertension 09/29/2015   DM (diabetes mellitus) (HCC) 09/29/2015   Eczema 09/29/2015    ONSET DATE: Referred on  05/24/22 (CVA 2015)   REFERRING DIAG: Y69.485 (ICD-10-CM) -  Dysarthria as late effect of cerebrovascular accident (CVA)  THERAPY DIAG:  Aphasia  Rationale for Evaluation and Treatment Rehabilitation  SUBJECTIVE: "Doing about the same."  PAIN:  Are you having pain? No    OBJECTIVE:   TODAY'S TREATMENT:  06/15/22: Skilled ST services focused on education and aphasia this session. Pt reports concerns with miscommunication at home and becoming easily agitated/frustrated with others due to her communication. SLP edu on word finding strategies. Pt was able to demonstrate understanding of strategies by providing examples with minA. Pt reported she would be most likely to use re: describe and synonym. To edu in further detail next session. Pt could also benefit from communication strategies to decrease communication breakdowns at home. To cont.     PATIENT EDUCATION: Education details: aphasia Person educated: Patient Education method: Explanation, Demonstration, and Handouts Education comprehension: verbalized understanding and needs further education     ASSESSMENT:   CLINICAL IMPRESSION: See tx note. Continue with current POC.      OBJECTIVE IMPAIRMENTS include aphasia.  These impairments are limiting patient from maximizing functional communication in daily activities.    Factors affecting potential to achieve goals and functional outcome are  NA . Patient will benefit from skilled SLP services to address above impairments and improve overall function.   REHAB POTENTIAL: Fair due to extensive time post CVA   CLINICAL DECISION MAKING: Stable/uncomplicated   EVALUATION  COMPLEXITY: Low     GOALS: Goals reviewed with patient? Yes   SHORT TERM GOALS:   Pt will recall 3 word finding strategies to used in cases of anomia with minA verbal cues.  Baseline:  Target date: 07/06/2022   Goal status: INITIAL   2.  Pt will recall 2+ reading strategies to assist with comprehension during preferred book reading with minA verbal cues.   Baseline:  Target date: 07/06/2022   Goal status: INITIAL   LONG TERM GOALS:   Pt will demonstrate use of word finding strategies in cases of anomia during a 15 min independently. Baseline:  Target date: 08/03/2022   Goal status: INITIAL   2.  Pt will report successful use of reading strategies during book reading at home.  Baseline:  Target date: 08/03/2022   Goal status: INITIAL     PLAN: SLP FREQUENCY: 1x/week   SLP DURATION: 8 weeks   PLANNED INTERVENTIONS: Language facilitation, Cueing hierachy, Internal/external aids, Functional tasks, Multimodal communication approach, SLP instruction and feedback, Compensatory strategies, and Patient/family education    [place], CCC-SLP 06/14/2022, 2:06 PM

## 2022-06-22 ENCOUNTER — Encounter: Payer: Self-pay | Admitting: Speech Pathology

## 2022-06-22 ENCOUNTER — Ambulatory Visit: Payer: PPO | Attending: Nurse Practitioner | Admitting: Speech Pathology

## 2022-06-22 DIAGNOSIS — R4701 Aphasia: Secondary | ICD-10-CM | POA: Insufficient documentation

## 2022-06-22 NOTE — Therapy (Signed)
OUTPATIENT SPEECH LANGUAGE PATHOLOGY TREATMENT NOTE   Patient Name: [name] MRN: [id number] DOB:1966/11/16, 56 y.o., female Today's Date: 06/22/2022  PCP: Anne Ng, NP REFERRING PROVIDER: Anne Ng, NP  END OF SESSION:   End of Session - 06/22/22 1409     Visit Number 3    Number of Visits 9    Date for SLP Re-Evaluation 08/08/22    SLP Start Time 1404    SLP Stop Time  1442    SLP Time Calculation (min) 38 min    Activity Tolerance Patient tolerated treatment well             Past Medical History:  Diagnosis Date   Diabetes mellitus without complication (HCC) 2013   previously on victoza    Gestational diabetes 2009    Hypertension 09/07/2014   Stroke (HCC) 09/07/2014   R side. weakness in upper arm. No deficit in leg. deficit in speech. A littel bit in coordination.    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   Aphasia, late effect of cerebrovascular disease 03/14/2017   Dysarthria as late effect of cerebrovascular accident (CVA) 03/14/2017   Contracture of joint of finger of right hand 02/02/2017   Weakness of both hands 02/02/2017   History of CVA with residual deficit 03/02/2016   CVA (cerebral vascular accident) (HCC) 09/29/2015   Essential hypertension 09/29/2015   DM (diabetes mellitus) (HCC) 09/29/2015   Eczema 09/29/2015    ONSET DATE: Referred on  05/24/22 (CVA 2015)   REFERRING DIAG: H08.657 (ICD-10-CM) -  Dysarthria as late effect of cerebrovascular accident (CVA)  THERAPY DIAG:  Aphasia  Rationale for Evaluation and Treatment Rehabilitation  SUBJECTIVE: "Doing pretty good."  PAIN:  Are you having pain? No    OBJECTIVE:   TODAY'S TREATMENT:   06/23/22: Skilled ST services targeted cognitive-communication this session. SLP provided edu to patient regarding communication strategies to assist with increased comprehension and processing of verbally provided information. Pt reports she believes re: asking for clarification and reducing distractions will be helpful. Pt did require minA verbal cues for further comprehension of information. She also required cue to use the reading strategy (paper cover to assist with reading line-by-line) while reading through each individual strategy. Will cont with word finding strategies and practice SFA if time allows.   06/15/22: Skilled ST services focused on education and aphasia this session. Pt reports concerns with miscommunication at home and becoming easily agitated/frustrated with others due to her communication. SLP edu on word finding strategies. Pt was able to demonstrate understanding of strategies by providing examples with minA. Pt reported she would be most likely to use re: describe and synonym. To edu in further detail next session. Pt could also benefit from communication strategies to decrease communication breakdowns at home. To cont.     PATIENT EDUCATION: Education details: aphasia Person educated: Patient Education method: Explanation, Demonstration, and Handouts Education comprehension: verbalized understanding and needs further education  ASSESSMENT:   CLINICAL IMPRESSION: See tx note. Continue with current POC.      OBJECTIVE IMPAIRMENTS include aphasia.  These impairments are limiting patient from maximizing functional communication in daily activities.    Factors affecting potential to achieve goals and functional  outcome are  NA . Patient will benefit from skilled SLP services to address above impairments and improve overall function.   REHAB POTENTIAL: Fair due to extensive time post CVA   CLINICAL DECISION MAKING: Stable/uncomplicated   EVALUATION COMPLEXITY: Low     GOALS: Goals reviewed with patient? Yes   SHORT TERM GOALS:   Pt will recall 3 word finding strategies to used in cases of anomia with minA verbal cues.  Baseline:  Target date: 07/06/2022   Goal status: INITIAL   2.  Pt will recall 2+ reading strategies to assist with comprehension during preferred book reading with minA verbal cues.  Baseline:  Target date: 07/06/2022   Goal status: INITIAL   LONG TERM GOALS:   Pt will demonstrate use of word finding strategies in cases of anomia during a 15 min independently. Baseline:  Target date: 08/03/2022   Goal status: INITIAL   2.  Pt will report successful use of reading strategies during book reading at home.  Baseline:  Target date: 08/03/2022   Goal status: INITIAL     PLAN: SLP FREQUENCY: 1x/week   SLP DURATION: 8 weeks   PLANNED INTERVENTIONS: Language facilitation, Cueing hierachy, Internal/external aids, Functional tasks, Multimodal communication approach, SLP instruction and feedback, Compensatory strategies, and Patient/family education    [place], CCC-SLP 06/22/2022, 2:10 PM

## 2022-06-30 ENCOUNTER — Encounter: Payer: Self-pay | Admitting: Speech Pathology

## 2022-06-30 ENCOUNTER — Ambulatory Visit: Payer: PPO | Admitting: Speech Pathology

## 2022-06-30 DIAGNOSIS — R4701 Aphasia: Secondary | ICD-10-CM | POA: Diagnosis not present

## 2022-06-30 NOTE — Therapy (Signed)
OUTPATIENT SPEECH LANGUAGE PATHOLOGY TREATMENT NOTE   Patient Name: [name] MRN: [id number] DOB:1966/02/20, 56 y.o., female Today's Date: 06/30/2022  PCP: Anne Ng, NP REFERRING PROVIDER: Anne Ng, NP  END OF SESSION:   End of Session - 06/30/22 1616     Visit Number 4    Number of Visits 9    Date for SLP Re-Evaluation 08/08/22    SLP Start Time 1400    SLP Stop Time  1440    SLP Time Calculation (min) 40 min    Activity Tolerance Patient tolerated treatment well             Past Medical History:  Diagnosis Date   Diabetes mellitus without complication (HCC) 2013   previously on victoza    Gestational diabetes 2009    Hypertension 09/07/2014   Stroke (HCC) 09/07/2014   R side. weakness in upper arm. No deficit in leg. deficit in speech. A littel bit in coordination.    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   Aphasia, late effect of cerebrovascular disease 03/14/2017   Dysarthria as late effect of cerebrovascular accident (CVA) 03/14/2017   Contracture of joint of finger of right hand 02/02/2017   Weakness of both hands 02/02/2017   History of CVA with residual deficit 03/02/2016   CVA (cerebral vascular accident) (HCC) 09/29/2015   Essential hypertension 09/29/2015   DM (diabetes mellitus) (HCC) 09/29/2015   Eczema 09/29/2015    ONSET DATE: Referred on  05/24/22 (CVA 2015)   REFERRING DIAG: J88.416 (ICD-10-CM) -  Dysarthria as late effect of cerebrovascular accident (CVA)  THERAPY DIAG:  Aphasia  Rationale for Evaluation and Treatment Rehabilitation  SUBJECTIVE: "Doing pretty good."  PAIN:  Are you having pain? No    OBJECTIVE:   TODAY'S TREATMENT:  06/30/22: Skilled ST services focused on education and aphasia this session. SLP cont edu on word finding strategies. Pt was able to demonstrate understanding of strategies by providing examples with minA. Pt reported she would be most likely to use re: look it up. Trained pt in SFA for description. Pt was able to complete task with spv. Cont with current POC.   06/23/22: Skilled ST services targeted cognitive-communication this session. SLP provided edu to patient regarding communication strategies to assist with increased comprehension and processing of verbally provided information. Pt reports she believes re: asking for clarification and reducing distractions will be helpful. Pt did require minA verbal cues for further comprehension of information. She also required cue to use the reading strategy (paper cover to assist with reading line-by-line) while reading through each individual strategy. Will cont with word finding strategies and practice SFA if time allows.   06/15/22: Skilled ST services focused on education and aphasia this session. Pt reports concerns with miscommunication at home and becoming easily agitated/frustrated with others due to her communication. SLP edu on word finding strategies. Pt was able to demonstrate understanding of strategies by providing examples with  minA. Pt reported she would be most likely to use re: describe and synonym. To edu in further detail next session. Pt could also benefit from communication strategies to decrease communication breakdowns at home. To cont.     PATIENT EDUCATION: Education details: aphasia Person educated: Patient Education method: Explanation, Demonstration, and Handouts Education  comprehension: verbalized understanding and needs further education     ASSESSMENT:   CLINICAL IMPRESSION: See tx note. Continue with current POC.      OBJECTIVE IMPAIRMENTS include aphasia.  These impairments are limiting patient from maximizing functional communication in daily activities.    Factors affecting potential to achieve goals and functional outcome are  NA . Patient will benefit from skilled SLP services to address above impairments and improve overall function.   REHAB POTENTIAL: Fair due to extensive time post CVA   CLINICAL DECISION MAKING: Stable/uncomplicated   EVALUATION COMPLEXITY: Low     GOALS: Goals reviewed with patient? Yes   SHORT TERM GOALS:   Pt will recall 3 word finding strategies to used in cases of anomia with minA verbal cues.  Baseline:  Target date: 07/06/2022   Goal status: INITIAL   2.  Pt will recall 2+ reading strategies to assist with comprehension during preferred book reading with minA verbal cues.  Baseline:  Target date: 07/06/2022   Goal status: INITIAL   LONG TERM GOALS:   Pt will demonstrate use of word finding strategies in cases of anomia during a 15 min independently. Baseline:  Target date: 08/03/2022   Goal status: INITIAL   2.  Pt will report successful use of reading strategies during book reading at home.  Baseline:  Target date: 08/03/2022   Goal status: INITIAL     PLAN: SLP FREQUENCY: 1x/week   SLP DURATION: 8 weeks   PLANNED INTERVENTIONS: Language facilitation, Cueing hierachy, Internal/external aids, Functional tasks, Multimodal communication approach, SLP instruction and feedback, Compensatory strategies, and Patient/family education    [place], CCC-SLP 06/30/2022, 4:19 PM

## 2022-07-07 ENCOUNTER — Ambulatory Visit: Payer: PPO | Admitting: Speech Pathology

## 2022-07-14 ENCOUNTER — Other Ambulatory Visit: Payer: Self-pay

## 2022-07-14 ENCOUNTER — Ambulatory Visit (INDEPENDENT_AMBULATORY_CARE_PROVIDER_SITE_OTHER): Payer: PPO | Admitting: Nurse Practitioner

## 2022-07-14 ENCOUNTER — Encounter: Payer: Self-pay | Admitting: Nurse Practitioner

## 2022-07-14 VITALS — BP 122/68 | HR 93 | Temp 97.5°F | Ht 59.0 in | Wt 176.2 lb

## 2022-07-14 DIAGNOSIS — E162 Hypoglycemia, unspecified: Secondary | ICD-10-CM

## 2022-07-14 DIAGNOSIS — Z794 Long term (current) use of insulin: Secondary | ICD-10-CM | POA: Diagnosis not present

## 2022-07-14 DIAGNOSIS — E1165 Type 2 diabetes mellitus with hyperglycemia: Secondary | ICD-10-CM | POA: Diagnosis not present

## 2022-07-14 LAB — POCT GLYCOSYLATED HEMOGLOBIN (HGB A1C): Hemoglobin A1C: 9.9 % — AB (ref 4.0–5.6)

## 2022-07-14 MED ORDER — INSULIN GLARGINE-YFGN 100 UNIT/ML ~~LOC~~ SOPN
10.0000 [IU] | PEN_INJECTOR | Freq: Every day | SUBCUTANEOUS | 0 refills | Status: DC
  Filled 2022-07-14: qty 9, 90d supply, fill #0

## 2022-07-14 MED ORDER — TIRZEPATIDE 2.5 MG/0.5ML ~~LOC~~ SOAJ
2.5000 mg | SUBCUTANEOUS | 2 refills | Status: DC
  Filled 2022-07-14: qty 2, 28d supply, fill #0

## 2022-07-14 NOTE — Patient Instructions (Signed)
Improved hgbA1c: 13 to 9.9% Switch ozempic to mounjaro 2.5mg  wekly Use coupon. Decrease insulin dose to 10unit at bedtime

## 2022-07-14 NOTE — Assessment & Plan Note (Addendum)
Average glucose: 142-202 Reports hypoglycemic episodes in Am: 50s to 60s She denies skipping meals. Current use of insulin glargine 15units at hs, jardiance 10mg , metformin 500mg . Has been out of ozempic 0.5mg  x 2weeks due to cost.  repeat hgbA1c: 9.9% (improving) Decrease glargine dose to 10units Maintain jardiance and metformin dose Change ozempic to mounjaro (provided coupon) F/up in 56months

## 2022-07-14 NOTE — Progress Notes (Signed)
Established Patient Visit  Patient: [name]   DOB: 1966-10-24   56 y.o. Female  MRN: [id number] Visit Date: 07/14/2022  Subjective:    Chief Complaint  Patient presents with   Office Visit    F/u DM/ HTN  Says ozempic is too costly so she hasn't picked it up this week  No other concerns     HPI DM (diabetes mellitus) ([place]) Average glucose: 142-202 Reports hypoglycemic episodes in Am: 50s to 60s She denies skipping meals. Current use of insulin glargine 15units at hs, jardiance 10mg , metformin 500mg . Has been out of ozempic 0.5mg  x 2weeks due to cost.  repeat hgbA1c: 9.9% (improving) Decrease glargine dose to 10units Maintain jardiance and metformin dose Change ozempic to mounjaro (provided coupon) F/up in 52months  Wt Readings from Last 3 Encounters:  07/14/22 176 lb 3.2 oz (79.9 kg)  06/09/22 176 lb 3.2 oz (79.9 kg)  05/24/22 179 lb 3.2 oz (81.3 kg)    Reviewed medical, surgical, and social history today  Medications: Outpatient Medications Prior to Visit  Medication Sig   amLODipine (NORVASC) 10 MG tablet Take 1 tablet (10 mg total) by mouth daily.   aspirin EC 81 MG tablet Take 162 mg by mouth daily. Swallow whole.   Blood Glucose Monitoring Suppl (TRUE METRIX METER) w/Device KIT 1 each by Does not apply route as needed.   Continuous Blood Gluc Sensor (FREESTYLE LIBRE 2 SENSOR) MISC 1 Units by Does not apply route every 14 (fourteen) days.   Elastic Bandages & Supports (FINGER SPLINT) MISC 1 each by Does not apply route daily. On Left thumb and ring finger   empagliflozin (JARDIANCE) 10 MG TABS tablet Take 1 tablet (10 mg total) by mouth daily before breakfast.   glucose blood (TRUE METRIX BLOOD GLUCOSE TEST) test strip 1 each by Other route 2 (two) times daily.   Insulin Pen Needle (PEN NEEDLES) 31G X 6 MM MISC 1 application. by Does not apply route at bedtime.   losartan-hydrochlorothiazide (HYZAAR) 100-12.5 MG tablet Take 1 tablet by mouth  daily.   metFORMIN (GLUCOPHAGE-XR) 500 MG 24 hr tablet Take 1 tablet (500 mg total) by mouth daily with breakfast.   metFORMIN (GLUCOPHAGE-XR) 500 MG 24 hr tablet Take 1 tablet by mouth daily with breakfast.   TRUEPLUS LANCETS 28G MISC 1 each by Does not apply route 2 (two) times daily.   [DISCONTINUED] insulin glargine (LANTUS SOLOSTAR) 100 UNIT/ML Solostar Pen Inject into the skin.   [DISCONTINUED] insulin glargine-yfgn (SEMGLEE) 100 UNIT/ML Pen Inject 15 Units into the skin at bedtime.   [DISCONTINUED] Semaglutide,0.25 or 0.5MG /DOS, (OZEMPIC, 0.25 OR 0.5 MG/DOSE,) 2 MG/1.5ML SOPN Inject 0.5 mg into the skin once a week.   aspirin EC 81 MG tablet Take 1 tablet (81 mg total) by mouth daily. (Patient not taking: Reported on 07/14/2022)   No facility-administered medications prior to visit.   Reviewed past medical and social history.   ROS per HPI above      Objective:  BP 122/68 (BP Location: Right Arm, Patient Position: Sitting, Cuff Size: Normal)   Pulse 93   Temp (!) 97.5 F (36.4 C) (Temporal)   Ht 4\' 11"  (1.499 m)   Wt 176 lb 3.2 oz (79.9 kg)   SpO2 97%   BMI 35.59 kg/m      Physical Exam Cardiovascular:     Rate and Rhythm: Normal rate.     Pulses:  Normal pulses.  Pulmonary:     Effort: Pulmonary effort is normal.  Neurological:     Mental Status: She is alert and oriented to person, place, and time.  Psychiatric:        Mood and Affect: Mood normal.        Behavior: Behavior normal.        Thought Content: Thought content normal.     Results for orders placed or performed in visit on 07/14/22  POCT glycosylated hemoglobin (Hb A1C)  Result Value Ref Range   Hemoglobin A1C 9.9 (A) 4.0 - 5.6 %      Assessment & Plan:    Problem List Items Addressed This Visit       Endocrine   DM (diabetes mellitus) ([place]) - Primary    Average glucose: 142-202 Reports hypoglycemic episodes in Am: 50s to 60s She denies skipping meals. Current use of insulin glargine  15units at hs, jardiance $RemoveBefor'10mg'hHpkcJOyPsem$ , metformin $RemoveBef'500mg'xcQbIGAiQN$ . Has been out of ozempic 0.$RemoveBefo'5mg'qSMyOUOBeQr$  x 2weeks due to cost.  repeat hgbA1c: 9.9% (improving) Decrease glargine dose to 10units Maintain jardiance and metformin dose Change ozempic to mounjaro (provided coupon) F/up in 21months      Relevant Medications   metFORMIN (GLUCOPHAGE-XR) 500 MG 24 hr tablet   tirzepatide (MOUNJARO) 2.5 MG/0.5ML Pen   insulin glargine-yfgn (SEMGLEE) 100 UNIT/ML Pen   Other Visit Diagnoses     Hypoglycemia       Relevant Orders   POCT glycosylated hemoglobin (Hb A1C) (Completed)      Return in about 2 months (around 09/14/2022) for DM and, DM and HTN, hyperlipidemia (fasting).     Wilfred Lacy, NP

## 2022-07-19 ENCOUNTER — Other Ambulatory Visit: Payer: Self-pay

## 2022-07-20 ENCOUNTER — Telehealth: Payer: Self-pay | Admitting: Nurse Practitioner

## 2022-07-20 DIAGNOSIS — E1165 Type 2 diabetes mellitus with hyperglycemia: Secondary | ICD-10-CM

## 2022-07-20 MED ORDER — INSULIN GLARGINE-YFGN 100 UNIT/ML ~~LOC~~ SOPN: 10.0000 [IU] | PEN_INJECTOR | Freq: Every day | SUBCUTANEOUS | 0 refills | Status: DC

## 2022-07-20 MED ORDER — TIRZEPATIDE 2.5 MG/0.5ML ~~LOC~~ SOAJ: 2.5000 mg | SUBCUTANEOUS | 2 refills | Status: DC

## 2022-07-20 NOTE — Telephone Encounter (Signed)
Meeds sent to pharmacy

## 2022-07-20 NOTE — Telephone Encounter (Signed)
Caller Name: Allean Call back phone #: [phone]  MEDICATION(S): tirzepatide [facility]) 2.5 MG/0.5ML Pen [[id number]]  and  [id number]  insulin glargine-yfgn (SEMGLEE) 100 UNIT/ML Pen [[id number]]   Days of Med Remaining:   Has the patient contacted their pharmacy (YES/NO)?  What did pharmacy advise?  Preferred Pharmacy:  [facility] [address] [place] - [address] Phone:  [phone]      ~~~Please advise patient/caregiver to allow 2-3 business days to process RX refills. Pt stated you sent it to a pharmacy that is to far for her to get to so pt would like you to resend the medication order to the pharmacy above

## 2022-07-21 ENCOUNTER — Other Ambulatory Visit: Payer: Self-pay

## 2022-07-22 ENCOUNTER — Encounter: Payer: PPO | Attending: Nurse Practitioner | Admitting: Dietician

## 2022-07-22 ENCOUNTER — Encounter: Payer: Self-pay | Admitting: Dietician

## 2022-07-22 DIAGNOSIS — E1165 Type 2 diabetes mellitus with hyperglycemia: Secondary | ICD-10-CM | POA: Insufficient documentation

## 2022-07-22 DIAGNOSIS — Z794 Long term (current) use of insulin: Secondary | ICD-10-CM | POA: Insufficient documentation

## 2022-07-22 NOTE — Patient Instructions (Addendum)
Consider taking your baby aspirin with food or discuss your stomach issues with your doctor.  Consider getting your vitamin D checked. Take a vitamin B-12 if you are not eating any animal products  Foods that can decrease your blood pressure:  2 Tablespoons of ground flax seeds  Hibiscus tea (without sugar and no more than 1 quart per day)  Vegetables and fruit  Plant based eating in general  Include a source of protein with each meal:    Tofu, beans, lentils Continue to follow a low sodium diet Continue to stay active.  Nightshade vegetables can cause pain in some people:  Tomatoes, eggplant, white potatoes, peppers, paprika, cayenne   Resources for ideas:  Well your world  Tabitha Montel Culver Bernard/[employer] Cooks  [email or web address].[email or web address]  Be a plant based woman warrior cookbook

## 2022-07-22 NOTE — Progress Notes (Signed)
Diabetes Self-Management Education  Visit Type: First/Initial  Appt. Start Time: 1540 Appt. End Time: 1650  07/22/2022  [name], identified by name and date of birth, is a 56 y.o. female with a diagnosis of Diabetes: Type 2.   ASSESSMENT Patient is here today alone.  She was last seen by a RD in our office in 2019.  She states that she loves sweets and fruit and noted that is why her A1C rose so much in June. Cortisone shot in knee. Current challenge with a friend:  exercise, green leafy, adequate water, hold off on sweets for 30 days. Sensor reading currently 119  Referral diagnosis:  Essential HTN, Type 2 Diabetes  History includes:  Type 2 Diabetes (1998), HTN, HLD, CVA (2015) Medications include:  Jardiance, Metformin XR, Ozempic but will change to [facility], Lantus 15 units q night, sea moss, burdock root, bladderwrack ( allergic to statin medications) Labs noted to include A1C 9.9% 07/14/2022 decreased from 13.4% 05/24/2022, cholesterol 240, LDL 171, HDL, 43, Triglycerides 129 12/23/2021 CGM:  FreeStyle Libre 2  Patient lives with her husband and 2 children.  She does the shopping and cooking.  She is on disability and used to work in the [employer]. She moved from [place] back home in 2016. Right hand weakness. She struggles with memory and will be resuming Speech therapy again soon. She has been vegetarian on and off since 2012. She juices (beets, celery, cucumber, ginger, carrots, green apple, 1/2 pear) and uses the pulp as seasoning in other recipes. She does intermittent fasting with varying times (10-7 or 11-6). Frequently only eats breakfast and nothing the rest of the day. Currently in college in [employer]. Walks daily 2-5 miles and strength training 3 times per week.  Height 4\' 11"  (1.499 m), weight 177 lb (80.3 kg). Body mass index is 35.75 kg/m.   Diabetes Self-Management Education - 07/22/22 1514       Visit Information   Visit Type  First/Initial      Initial Visit   Diabetes Type Type 2    Date Diagnosed 1998    Are you currently following a meal plan? No    Are you taking your medications as prescribed? Yes      Health Coping   How would you rate your overall health? Good      Psychosocial Assessment   Patient Belief/Attitude about Diabetes Motivated to manage diabetes    What is the hardest part about your diabetes right now, causing you the most concern, or is the most worrisome to you about your diabetes?   Taking/obtaining medications    Self-care barriers None    Self-management support Doctor's office    Other persons present Patient    Patient Concerns Nutrition/Meal planning    Special Needs None    Preferred Learning Style No preference indicated    Learning Readiness Ready    How often do you need to have someone help you when you read instructions, pamphlets, or other written materials from your doctor or pharmacy? 1 - Never    What is the last grade level you completed in school? 12      Pre-Education Assessment   Patient understands the diabetes disease and treatment process. Needs Review    Patient understands incorporating nutritional management into lifestyle. Needs Review    Patient undertands incorporating physical activity into lifestyle. Needs Review    Patient understands using medications safely. Needs Review    Patient understands monitoring blood  glucose, interpreting and using results Needs Review    Patient understands prevention, detection, and treatment of acute complications. Needs Review    Patient understands prevention, detection, and treatment of chronic complications. Needs Review    Patient understands how to develop strategies to address psychosocial issues. Needs Review    Patient understands how to develop strategies to promote health/change behavior. Needs Review      Complications   Last HgB A1C per patient/outside source 9.9 %   06/2022 decreased from 13.4%  05/24/2022    How often do you check your blood sugar? > 4 times/day    Fasting Blood glucose range (mg/dL) 02-585    Postprandial Blood glucose range (mg/dL) 27-782    Number of hypoglycemic episodes per month 1    Can you tell when your blood sugar is low? Yes    What do you do if your blood sugar is low? peppermint or OJ    Have you had a dilated eye exam in the past 12 months? Yes    Have you had a dental exam in the past 12 months? Yes    Are you checking your feet? Yes      Dietary Intake   Breakfast Plant based protein shake with berries and occasional spinach or kale OR grits, okra, eggs, watermelon or cheese and grits, okra    Snack (afternoon) vegetable juice, increased sweets such as bundt cake or sweet potato pie on the weekend    Dinner salad with hemp seeds, quinoa occasionaly  or skips    Beverage(s) water, vegetable juice, coconut water, tea with herbs, wine rarely      Activity / Exercise   Activity / Exercise Type Light (walking / raking leaves)    How many days per week do you exercise? 7    How many minutes per day do you exercise? 60    Total minutes per week of exercise 420      Patient Education   Previous Diabetes Education Yes (please comment)   2019   Healthy Eating Other (comment);Meal options for control of blood glucose level and chronic complications.   plant based nutrition and ideas   Being Active Identified with patient nutritional and/or medication changes necessary with exercise.    Medications Reviewed patients medication for diabetes, action, purpose, timing of dose and side effects.    Monitoring Identified appropriate SMBG and/or A1C goals.      Individualized Goals (developed by patient)   Nutrition General guidelines for healthy choices and portions discussed    Physical Activity Exercise 5-7 days per week;45 minutes per day    Medications take my medication as prescribed    Monitoring  Consistenly use CGM    Problem Solving Eating Pattern;Medication  consistency      Post-Education Assessment   Patient understands the diabetes disease and treatment process. Demonstrates understanding / competency    Patient understands incorporating nutritional management into lifestyle. Comprehends key points    Patient undertands incorporating physical activity into lifestyle. Demonstrates understanding / competency    Patient understands using medications safely. Demonstrates understanding / competency    Patient understands monitoring blood glucose, interpreting and using results Comprehends key points    Patient understands prevention, detection, and treatment of acute complications. Demonstrates understanding / competency    Patient understands prevention, detection, and treatment of chronic complications. Demonstrates understanding / competency    Patient understands how to develop strategies to address psychosocial issues. Demonstrates understanding / competency  Patient understands how to develop strategies to promote health/change behavior. Comprehends key points      Outcomes   Expected Outcomes Demonstrated interest in learning. Expect positive outcomes    Future DMSE PRN    Program Status Completed             Individualized Plan for Diabetes Self-Management Training:   Learning Objective:  Patient will have a greater understanding of diabetes self-management. Patient education plan is to attend individual and/or group sessions per assessed needs and concerns.   Plan:   Patient Instructions  Consider taking your baby aspirin with food or discuss your stomach issues with your doctor.  Consider getting your vitamin D checked. Take a vitamin B-12 if you are not eating any animal products  Foods that can decrease your blood pressure:  2 Tablespoons of ground flax seeds  Hibiscus tea (without sugar and no more than 1 quart per day)  Vegetables and fruit  Plant based eating in general  Include a source of protein with each  meal:    Tofu, beans, lentils Continue to follow a low sodium diet Continue to stay active.  Nightshade vegetables can cause pain in some people:  Tomatoes, eggplant, white potatoes, peppers, paprika, cayenne   Resources for ideas:  Well your world  Tabitha Durward Fortes Bernard/[employer] Cooks  [email or web address]  Be a plant based woman warrior cookbook Expected Outcomes:  Demonstrated interest in learning. Expect positive outcomes  Education material provided: Food label handouts and Diabetes Resources  If problems or questions, patient to contact team via:  Phone  Future DSME appointment: PRN

## 2022-07-25 ENCOUNTER — Other Ambulatory Visit: Payer: Self-pay

## 2022-07-25 ENCOUNTER — Telehealth: Payer: Self-pay | Admitting: Nurse Practitioner

## 2022-07-25 NOTE — Telephone Encounter (Signed)
Left message for patient to call back and schedule Medicare Annual Wellness Visit (AWV).   Please offer to do virtually or by telephone.  Left office number and my jabber #[phone].  AWVI eligible as of 02/16/2018  Please schedule at anytime with Nurse Health Advisor.   

## 2022-08-03 ENCOUNTER — Ambulatory Visit: Payer: PPO | Admitting: Skilled Nursing Facility1

## 2022-08-09 ENCOUNTER — Ambulatory Visit (INDEPENDENT_AMBULATORY_CARE_PROVIDER_SITE_OTHER): Payer: PPO

## 2022-08-09 DIAGNOSIS — Z0000 Encounter for general adult medical examination without abnormal findings: Secondary | ICD-10-CM | POA: Diagnosis not present

## 2022-08-09 NOTE — Progress Notes (Signed)
Subjective:   [name] is a 56 y.o. female who presents for an Initial Medicare Annual Wellness Visit.   I connected with [name]  today by telephone and verified that I am speaking with the correct person using two identifiers. Location patient: home Location provider: work Persons participating in the virtual visit: patient, provider.   I discussed the limitations, risks, security and privacy concerns of performing an evaluation and management service by telephone and the availability of in person appointments. I also discussed with the patient that there may be a patient responsible charge related to this service. The patient expressed understanding and verbally consented to this telephonic visit.    Interactive audio and video telecommunications were attempted between this provider and patient, however failed, due to patient having technical difficulties OR patient did not have access to video capability.  We continued and completed visit with audio only.    Review of Systems     Cardiac Risk Factors include: diabetes mellitus     Objective:    Today's Vitals   There is no height or weight on file to calculate BMI.     08/09/2022    4:04 PM 07/22/2022    3:03 PM 06/07/2022   12:41 PM 05/31/2020   10:49 PM 11/16/2018    9:59 AM 11/01/2018    8:07 AM 09/21/2018    9:00 AM  Advanced Directives  Does Patient Have a Medical Advance Directive? Yes Yes Yes No No No No  Type of Paramedic of [place];Living will  [place] in Chart? No - copy requested  No - copy requested      Would patient like information on creating a medical advance directive?    No - Patient declined No - Patient declined No - Patient declined No - Patient declined    Current Medications (verified) Outpatient Encounter Medications as of 08/09/2022  Medication Sig   amLODipine (NORVASC) 10 MG tablet Take 1 tablet  (10 mg total) by mouth daily.   aspirin EC 81 MG tablet Take 1 tablet (81 mg total) by mouth daily.   aspirin EC 81 MG tablet Take 162 mg by mouth daily. Swallow whole.   Blood Glucose Monitoring Suppl (TRUE METRIX METER) w/Device KIT 1 each by Does not apply route as needed.   Continuous Blood Gluc Sensor (FREESTYLE LIBRE 2 SENSOR) MISC 1 Units by Does not apply route every 14 (fourteen) days.   Elastic Bandages & Supports (FINGER SPLINT) MISC 1 each by Does not apply route daily. On Left thumb and ring finger   empagliflozin (JARDIANCE) 10 MG TABS tablet Take 1 tablet (10 mg total) by mouth daily before breakfast.   glucose blood (TRUE METRIX BLOOD GLUCOSE TEST) test strip 1 each by Other route 2 (two) times daily.   insulin glargine (LANTUS) 100 UNIT/ML injection Inject 15 Units into the skin daily.   insulin glargine-yfgn (SEMGLEE) 100 UNIT/ML Pen Inject 10 Units into the skin at bedtime.   Insulin Pen Needle (PEN NEEDLES) 31G X 6 MM MISC 1 application. by Does not apply route at bedtime.   losartan-hydrochlorothiazide (HYZAAR) 100-12.5 MG tablet Take 1 tablet by mouth daily.   metFORMIN (GLUCOPHAGE-XR) 500 MG 24 hr tablet Take 1 tablet (500 mg total) by mouth daily with breakfast.   metFORMIN (GLUCOPHAGE-XR) 500 MG 24 hr tablet Take 1 tablet by mouth daily with breakfast.   tirzepatide (  MOUNJARO) 2.5 MG/0.5ML Pen Inject 2.5 mg into the skin once a week.   TRUEPLUS LANCETS 28G MISC 1 each by Does not apply route 2 (two) times daily.   [DISCONTINUED] ferrous sulfate (FERROUSUL) 325 (65 FE) MG tablet Take 1 tablet (325 mg total) by mouth daily with breakfast. (Patient not taking: Reported on 03/13/2019)   [DISCONTINUED] glipiZIDE (GLUCOTROL) 10 MG tablet Take 1 tablet (10 mg total) by mouth 2 (two) times daily before a meal. (Patient not taking: Reported on 03/13/2019)   [DISCONTINUED] nortriptyline (PAMELOR) 25 MG capsule Take 1 capsule (25 mg total) by mouth at bedtime. (Patient not taking:  Reported on 03/13/2019)   No facility-administered encounter medications on file as of 08/09/2022.    Allergies (verified) Lipitor [atorvastatin] and Statins   History: Past Medical History:  Diagnosis Date   Diabetes mellitus without complication ([place]) 3267   previously on [place]    Gestational diabetes 2009    Hypertension 09/07/2014   Stroke ([place]) 09/07/2014   R side. weakness in upper arm. No deficit in leg. deficit in speech. A littel bit in coordination.    Past Surgical History:  Procedure Laterality Date   BREAST SURGERY     CESAREAN SECTION     TUBAL LIGATION  2009   Family History  Problem Relation Age of Onset   Diabetes Mother    Hypertension Mother    Epilepsy Father    Hypertension Sister    Colon cancer Neg Hx    Social History   Socioeconomic History   Marital status: Married    Spouse name: Not on file   Number of children: 5   Years of education: associates   Highest education level: Not on file  Occupational History   Occupation: disabled  Tobacco Use   Smoking status: Never   Smokeless tobacco: Never  Vaping Use   Vaping Use: Never used  Substance and Sexual Activity   Alcohol use: No   Drug use: Yes    Types: Marijuana   Sexual activity: Not Currently  Other Topics Concern   Not on file  Social History Narrative   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   Social Determinants of Health   Financial Resource Strain: Low Risk  (08/09/2022)   Overall Financial Resource Strain (CARDIA)    Difficulty of Paying Living Expenses: Not hard at all  Food Insecurity: No Food Insecurity (08/09/2022)   Hunger Vital Sign    Worried About Running Out of Food in the Last Year: Never true    Ran Out of Food in the Last Year: Never true  Transportation Needs: No Transportation Needs (08/09/2022)   PRAPARE - Hydrologist (Medical): No    Lack of Transportation (Non-Medical): No   Physical Activity: Sufficiently Active (08/09/2022)   Exercise Vital Sign    Days of Exercise per Week: 4 days    Minutes of Exercise per Session: 60 min  Stress: No Stress Concern Present (08/09/2022)   [place]    Feeling of Stress : Not at all  Social Connections: Moderately Isolated (08/09/2022)   Social Connection and Isolation Panel [NHANES]    Frequency of Communication with Friends and Family: Three times a week    Frequency of Social Gatherings with Friends and Family: Three times a week    Attends Religious Services: Never    Active Member of Clubs  or Organizations: No    Attends Archivist Meetings: Never    Marital Status: Married    Tobacco Counseling Counseling given: Not Answered   Clinical Intake:  Pre-visit preparation completed: Yes  Pain : No/denies pain     Nutritional Risks: None Diabetes: No  How often do you need to have someone help you when you read instructions, pamphlets, or other written materials from your doctor or pharmacy?: 1 - Never What is the last grade level you completed in school?: college  Diabetic?yes  Nutrition Risk Assessment:  Has the patient had any N/V/D within the last 2 months?  No  Does the patient have any non-healing wounds?  No  Has the patient had any unintentional weight loss or weight gain?  No   Diabetes:  Is the patient diabetic?  Yes  If diabetic, was a CBG obtained today?  No  Did the patient bring in their glucometer from home?  No  How often do you monitor your CBG's? 3 x day .   Financial Strains and Diabetes Management:  Are you having any financial strains with the device, your supplies or your medication? No .  Does the patient want to be seen by Chronic Care Management for management of their diabetes?  No  Would the patient like to be referred to a Nutritionist or for Diabetic Management?  No   Diabetic  Exams:  Diabetic Eye Exam: Overdue for diabetic eye exam. Pt has been advised about the importance in completing this exam. Patient advised to call and schedule an eye exam. Diabetic Foot Exam: Overdue, Pt has been advised about the importance in completing this exam. Pt is scheduled for diabetic foot exam on next office visit .   Interpreter Needed?: No  Information entered by :: L.Wilson,LPN   Activities of Daily Living    08/09/2022    4:10 PM 08/08/2022    4:50 PM  In your present state of health, do you have any difficulty performing the following activities:  Hearing? 0 0  Vision? 0 0  Difficulty concentrating or making decisions? 0 1  Walking or climbing stairs? 0 0  Dressing or bathing? 0   Doing errands, shopping? 0 0  Preparing Food and eating ? N N  Using the Toilet? N N  In the past six months, have you accidently leaked urine? N N  Do you have problems with loss of bowel control? N N  Managing your Medications? N N  Managing your Finances? N Y  Housekeeping or managing your Housekeeping? N N    Patient Care Team: Nche, Charlene Brooke, NP as PCP - General (Internal Medicine) Servando Salina, MD as Consulting Physician (Obstetrics and Gynecology) Darliss Cheney, CNM as Midwife (Obstetrics and Gynecology)  Indicate any recent Medical Services you may have received from other than [facility] providers in the past year (date may be approximate).     Assessment:   This is a routine wellness examination for [place].  Hearing/Vision screen Vision Screening - Comments:: Annual eye exams appointment tomorrow 08/10/2022  Wears glasses   Dietary issues and exercise activities discussed: Current Exercise Habits: Home exercise routine, Type of exercise: walking, Time (Minutes): 30, Frequency (Times/Week): 5, Weekly Exercise (Minutes/Week): 150, Intensity: Mild, Exercise limited by: None identified   Goals Addressed             This Visit's Progress    Exercise 3x  per week (30 min per time)  Depression Screen    08/09/2022    4:10 PM 08/09/2022    4:02 PM 07/22/2022    3:03 PM 08/02/2018    3:15 PM 04/05/2018    4:20 PM 01/08/2018   12:01 PM 12/29/2017   11:15 AM  PHQ 2/9 Scores  PHQ - 2 Score 0 0 0 0 1 2 0    Fall Risk    08/09/2022    4:05 PM 08/08/2022    4:50 PM 07/22/2022    3:03 PM 09/03/2018    1:57 PM 05/10/2018    9:17 AM  Fall Risk   Falls in the past year? 0 0 0 No No  Number falls in past yr: 0 0     Injury with Fall? 0 0     Follow up Falls evaluation completed;Education provided        FALL RISK PREVENTION PERTAINING TO THE HOME:  Any stairs in or around the home? Yes  If so, are there any without handrails? No  Home free of loose throw rugs in walkways, pet beds, electrical cords, etc? Yes  Adequate lighting in your home to reduce risk of falls? Yes   ASSISTIVE DEVICES UTILIZED TO PREVENT FALLS:  Life alert? No  Use of a cane, walker or w/c? No  Grab bars in the bathroom? No  Shower chair or bench in shower? No  Elevated toilet seat or a handicapped toilet? No     Cognitive Function:    Normal cognitive status assessed by telephone conversation  by this Nurse Health Advisor. No abnormalities found.      08/09/2022    4:11 PM  6CIT Screen  What Year? 0 points  What month? 0 points  What time? 0 points  Count back from 20 0 points  Months in reverse 0 points  Repeat phrase 0 points  Total Score 0 points    Immunizations Immunization History  Administered Date(s) Administered   Influenza,inj,Quad PF,6+ Mos 10/06/2021   Janssen (J&J) SARS-COV-2 Vaccination 09/21/2020   PPD Test 05/09/2022    TDAP status: Due, Education has been provided regarding the importance of this vaccine. Advised may receive this vaccine at local pharmacy or Health Dept. Aware to provide a copy of the vaccination record if obtained from local pharmacy or Health Dept. Verbalized acceptance and understanding.  Flu Vaccine  status: Due, Education has been provided regarding the importance of this vaccine. Advised may receive this vaccine at local pharmacy or Health Dept. Aware to provide a copy of the vaccination record if obtained from local pharmacy or Health Dept. Verbalized acceptance and understanding.  Pneumococcal vaccine status: Due, Education has been provided regarding the importance of this vaccine. Advised may receive this vaccine at local pharmacy or Health Dept. Aware to provide a copy of the vaccination record if obtained from local pharmacy or Health Dept. Verbalized acceptance and understanding.  Covid-19 vaccine status: Completed vaccines  Qualifies for Shingles Vaccine? Yes   Zostavax completed No   Shingrix Completed?: No.    Education has been provided regarding the importance of this vaccine. Patient has been advised to call insurance company to determine out of pocket expense if they have not yet received this vaccine. Advised may also receive vaccine at local pharmacy or Health Dept. Verbalized acceptance and understanding.  Screening Tests Health Maintenance  Topic Date Due   Hepatitis C Screening  Never done   OPHTHALMOLOGY EXAM  12/21/2017   COVID-19 Vaccine (2 - Janssen risk series) 10/19/2020  INFLUENZA VACCINE  07/19/2022   Zoster Vaccines- Shingrix (1 of 2) 08/24/2022 (Originally [birth year]/1987)   TETANUS/TDAP  09/27/2027 (Originally [birth year]/29/1987)   Fecal DNA (Cologuard)  11/18/2022   HEMOGLOBIN A1C  01/14/2023   MAMMOGRAM  02/03/2023   PAP SMEAR-Modifier  05/09/2023   FOOT EXAM  06/10/2023   HIV Screening  Completed   HPV VACCINES  Aged Out    Health Maintenance  Health Maintenance Due  Topic Date Due   Hepatitis C Screening  Never done   OPHTHALMOLOGY EXAM  12/21/2017   COVID-19 Vaccine (2 - Janssen risk series) 10/19/2020   INFLUENZA VACCINE  07/19/2022    Colorectal cancer screening: Type of screening: Cologuard. Completed 11/19/2019. Repeat every 3 years  Mammogram  status: Completed 02/03/2022. Repeat every year  Bone Density status: Ordered not of age . Pt provided with contact info and advised to call to schedule appt.  Lung Cancer Screening: (Low Dose CT Chest recommended if Age 30-80 years, 30 pack-year currently smoking OR have quit w/in 15years.) does not qualify.   Lung Cancer Screening Referral: n/a  Additional Screening:  Hepatitis C Screening: does not qualify;   Vision Screening: Recommended annual ophthalmology exams for early detection of glaucoma and other disorders of the eye. Is the patient up to date with their annual eye exam?  Yes  Who is the provider or what is the name of the office in which the patient attends annual eye exams? [facility]  If pt is not established with a provider, would they like to be referred to a provider to establish care? No .   Dental Screening: Recommended annual dental exams for proper oral hygiene  Community Resource Referral / Chronic Care Management: CRR required this visit?  No   CCM required this visit?  No      Plan:     I have personally reviewed and noted the following in the patient's chart:   Medical and social history Use of alcohol, tobacco or illicit drugs  Current medications and supplements including opioid prescriptions. Patient is not currently taking opioid prescriptions. Functional ability and status Nutritional status Physical activity Advanced directives List of other physicians Hospitalizations, surgeries, and ER visits in previous 12 months Vitals Screenings to include cognitive, depression, and falls Referrals and appointments  In addition, I have reviewed and discussed with patient certain preventive protocols, quality metrics, and best practice recommendations. A written personalized care plan for preventive services as well as general preventive health recommendations were provided to patient.     Daphane Shepherd, LPN   5/52/0802   Nurse Notes: none

## 2022-08-09 NOTE — Patient Instructions (Signed)
[name] , Thank you for taking time to come for your Medicare Wellness Visit. I appreciate your ongoing commitment to your health goals. Please review the following plan we discussed and let me know if I can assist you in the future.   Screening recommendations/referrals: Colonoscopy: Cologuard 11/19/2019 Mammogram: 02/03/2022 Bone Density: not of age  Recommended yearly ophthalmology/optometry visit for glaucoma screening and checkup Recommended yearly dental visit for hygiene and checkup  Vaccinations: Influenza vaccine: completed  Pneumococcal vaccine: due  Tdap vaccine: due  Shingles vaccine: will consider     Advanced directives: yes   Conditions/risks identified: none   Next appointment: none   Preventive Care 40-64 Years, Female Preventive care refers to lifestyle choices and visits with your health care provider that can promote health and wellness. What does preventive care include? A yearly physical exam. This is also called an annual well check. Dental exams once or twice a year. Routine eye exams. Ask your health care provider how often you should have your eyes checked. Personal lifestyle choices, including: Daily care of your teeth and gums. Regular physical activity. Eating a healthy diet. Avoiding tobacco and drug use. Limiting alcohol use. Practicing safe sex. Taking low-dose aspirin daily starting at age 70. Taking vitamin and mineral supplements as recommended by your health care provider. What happens during an annual well check? The services and screenings done by your health care provider during your annual well check will depend on your age, overall health, lifestyle risk factors, and family history of disease. Counseling  Your health care provider may ask you questions about your: Alcohol use. Tobacco use. Drug use. Emotional well-being. Home and relationship well-being. Sexual activity. Eating habits. Work and work Statistician. Method of  birth control. Menstrual cycle. Pregnancy history. Screening  You may have the following tests or measurements: Height, weight, and BMI. Blood pressure. Lipid and cholesterol levels. These may be checked every 5 years, or more frequently if you are over 60 years old. Skin check. Lung cancer screening. You may have this screening every year starting at age 45 if you have a 30-pack-year history of smoking and currently smoke or have quit within the past 15 years. Fecal occult blood test (FOBT) of the stool. You may have this test every year starting at age 33. Flexible sigmoidoscopy or colonoscopy. You may have a sigmoidoscopy every 5 years or a colonoscopy every 10 years starting at age 23. Hepatitis C blood test. Hepatitis B blood test. Sexually transmitted disease (STD) testing. Diabetes screening. This is done by checking your blood sugar (glucose) after you have not eaten for a while (fasting). You may have this done every 1-3 years. Mammogram. This may be done every 1-2 years. Talk to your health care provider about when you should start having regular mammograms. This may depend on whether you have a family history of breast cancer. BRCA-related cancer screening. This may be done if you have a family history of breast, ovarian, tubal, or peritoneal cancers. Pelvic exam and Pap test. This may be done every 3 years starting at age 31. Starting at age 1, this may be done every 5 years if you have a Pap test in combination with an HPV test. Bone density scan. This is done to screen for osteoporosis. You may have this scan if you are at high risk for osteoporosis. Discuss your test results, treatment options, and if necessary, the need for more tests with your health care provider. Vaccines  Your health care provider may  recommend certain vaccines, such as: Influenza vaccine. This is recommended every year. Tetanus, diphtheria, and acellular pertussis (Tdap, Td) vaccine. You may need a Td  booster every 10 years. Zoster vaccine. You may need this after age 88. Pneumococcal 13-valent conjugate (PCV13) vaccine. You may need this if you have certain conditions and were not previously vaccinated. Pneumococcal polysaccharide (PPSV23) vaccine. You may need one or two doses if you smoke cigarettes or if you have certain conditions. Talk to your health care provider about which screenings and vaccines you need and how often you need them. This information is not intended to replace advice given to you by your health care provider. Make sure you discuss any questions you have with your health care provider. Document Released: 01/01/2016 Document Revised: 08/24/2016 Document Reviewed: 10/06/2015 Elsevier Interactive Patient Education  2017 [place] Prevention in the Home Falls can cause injuries. They can happen to people of all ages. There are many things you can do to make your home safe and to help prevent falls. What can I do on the outside of my home? Regularly fix the edges of walkways and driveways and fix any cracks. Remove anything that might make you trip as you walk through a door, such as a raised step or threshold. Trim any bushes or trees on the path to your home. Use bright outdoor lighting. Clear any walking paths of anything that might make someone trip, such as rocks or tools. Regularly check to see if handrails are loose or broken. Make sure that both sides of any steps have handrails. Any raised decks and porches should have guardrails on the edges. Have any leaves, snow, or ice cleared regularly. Use sand or salt on walking paths during winter. Clean up any spills in your garage right away. This includes oil or grease spills. What can I do in the bathroom? Use night lights. Install grab bars by the toilet and in the tub and shower. Do not use towel bars as grab bars. Use non-skid mats or decals in the tub or shower. If you need to sit down in the  shower, use a plastic, non-slip stool. Keep the floor dry. Clean up any water that spills on the floor as soon as it happens. Remove soap buildup in the tub or shower regularly. Attach bath mats securely with double-sided non-slip rug tape. Do not have throw rugs and other things on the floor that can make you trip. What can I do in the bedroom? Use night lights. Make sure that you have a light by your bed that is easy to reach. Do not use any sheets or blankets that are too big for your bed. They should not hang down onto the floor. Have a firm chair that has side arms. You can use this for support while you get dressed. Do not have throw rugs and other things on the floor that can make you trip. What can I do in the kitchen? Clean up any spills right away. Avoid walking on wet floors. Keep items that you use a lot in easy-to-reach places. If you need to reach something above you, use a strong step stool that has a grab bar. Keep electrical cords out of the way. Do not use floor polish or wax that makes floors slippery. If you must use wax, use non-skid floor wax. Do not have throw rugs and other things on the floor that can make you trip. What can I do with my  stairs? Do not leave any items on the stairs. Make sure that there are handrails on both sides of the stairs and use them. Fix handrails that are broken or loose. Make sure that handrails are as long as the stairways. Check any carpeting to make sure that it is firmly attached to the stairs. Fix any carpet that is loose or worn. Avoid having throw rugs at the top or bottom of the stairs. If you do have throw rugs, attach them to the floor with carpet tape. Make sure that you have a light switch at the top of the stairs and the bottom of the stairs. If you do not have them, ask someone to add them for you. What else can I do to help prevent falls? Wear shoes that: Do not have high heels. Have rubber bottoms. Are comfortable and  fit you well. Are closed at the toe. Do not wear sandals. If you use a stepladder: Make sure that it is fully opened. Do not climb a closed stepladder. Make sure that both sides of the stepladder are locked into place. Ask someone to hold it for you, if possible. Clearly mark and make sure that you can see: Any grab bars or handrails. First and last steps. Where the edge of each step is. Use tools that help you move around (mobility aids) if they are needed. These include: Canes. Walkers. Scooters. Crutches. Turn on the lights when you go into a dark area. Replace any light bulbs as soon as they burn out. Set up your furniture so you have a clear path. Avoid moving your furniture around. If any of your floors are uneven, fix them. If there are any pets around you, be aware of where they are. Review your medicines with your doctor. Some medicines can make you feel dizzy. This can increase your chance of falling. Ask your doctor what other things that you can do to help prevent falls. This information is not intended to replace advice given to you by your health care provider. Make sure you discuss any questions you have with your health care provider. Document Released: 10/01/2009 Document Revised: 05/12/2016 Document Reviewed: 01/09/2015 Elsevier Interactive Patient Education  2017 [employer].

## 2022-09-15 ENCOUNTER — Encounter: Payer: Self-pay | Admitting: Nurse Practitioner

## 2022-09-15 ENCOUNTER — Ambulatory Visit (INDEPENDENT_AMBULATORY_CARE_PROVIDER_SITE_OTHER): Payer: PPO | Admitting: Nurse Practitioner

## 2022-09-15 ENCOUNTER — Ambulatory Visit: Payer: PPO | Admitting: Nurse Practitioner

## 2022-09-15 VITALS — BP 130/80 | HR 78 | Temp 97.2°F | Ht 59.0 in | Wt 175.2 lb

## 2022-09-15 DIAGNOSIS — Z794 Long term (current) use of insulin: Secondary | ICD-10-CM | POA: Diagnosis not present

## 2022-09-15 DIAGNOSIS — E1165 Type 2 diabetes mellitus with hyperglycemia: Secondary | ICD-10-CM

## 2022-09-15 DIAGNOSIS — I10 Essential (primary) hypertension: Secondary | ICD-10-CM

## 2022-09-15 DIAGNOSIS — E1169 Type 2 diabetes mellitus with other specified complication: Secondary | ICD-10-CM

## 2022-09-15 DIAGNOSIS — E785 Hyperlipidemia, unspecified: Secondary | ICD-10-CM

## 2022-09-15 LAB — COMPREHENSIVE METABOLIC PANEL
ALT: 15 U/L (ref 0–35)
AST: 19 U/L (ref 0–37)
Albumin: 4.4 g/dL (ref 3.5–5.2)
Alkaline Phosphatase: 74 U/L (ref 39–117)
BUN: 9 mg/dL (ref 6–23)
CO2: 30 mEq/L (ref 19–32)
Calcium: 10.2 mg/dL (ref 8.4–10.5)
Chloride: 102 mEq/L (ref 96–112)
Creatinine, Ser: 0.77 mg/dL (ref 0.40–1.20)
GFR: 87.48 mL/min (ref >60.00)
Glucose, Bld: 94 mg/dL (ref 70–99)
Potassium: 3.5 mEq/L (ref 3.5–5.1)
Sodium: 139 mEq/L (ref 135–145)
Total Bilirubin: 0.4 mg/dL (ref 0.2–1.2)
Total Protein: 7.8 g/dL (ref 6.0–8.3)

## 2022-09-15 LAB — LIPID PANEL
Cholesterol: 223 mg/dL — ABNORMAL HIGH (ref 0–200)
HDL: 48.6 mg/dL (ref >39.00)
LDL Cholesterol: 158 mg/dL — ABNORMAL HIGH (ref 0–99)
NonHDL: 173.93
Total CHOL/HDL Ratio: 5
Triglycerides: 80 mg/dL (ref 0.0–149.0)
VLDL: 16 mg/dL (ref 0.0–40.0)

## 2022-09-15 LAB — HEMOGLOBIN A1C: Hgb A1c MFr Bld: 7.9 % — ABNORMAL HIGH (ref 4.6–6.5)

## 2022-09-15 MED ORDER — TIRZEPATIDE 5 MG/0.5ML ~~LOC~~ SOAJ: 5.0000 mg | SUBCUTANEOUS | 1 refills | Status: DC

## 2022-09-15 MED ORDER — AMLODIPINE BESYLATE 10 MG PO TABS: 10.0000 mg | ORAL_TABLET | Freq: Every day | ORAL | 0 refills | Status: DC

## 2022-09-15 MED ORDER — LOSARTAN POTASSIUM-HCTZ 100-12.5 MG PO TABS: 1.0000 | ORAL_TABLET | Freq: Every day | ORAL | 0 refills | Status: DC

## 2022-09-15 MED ORDER — INSULIN GLARGINE 100 UNIT/ML ~~LOC~~ SOLN: 15.0000 [IU] | Freq: Every day | SUBCUTANEOUS | 5 refills | Status: DC

## 2022-09-15 MED ORDER — EZETIMIBE 10 MG PO TABS: 10.0000 mg | ORAL_TABLET | Freq: Every day | ORAL | 3 refills | Status: DC

## 2022-09-15 NOTE — Progress Notes (Signed)
Established Patient Visit  Patient: [name]   DOB: 06-17-1966   56 y.o. Female  MRN: [id number] Visit Date: 09/15/2022  Subjective:    Chief Complaint  Patient presents with   Office Visit    DM/ HTN/ Hyperlipidemia  Checks BP daily, just put on her dexcom so she's able to monitor her BS regularly  No concerns     HPI DM (diabetes mellitus) ([place]) Home glucose: 95, 121, 222,  Average glucose in 7days: 145 Average glucose in last 30day: 152, 140 Low events:5 in last 90days, 4events 12am-6am and 1event-3-6pm. Unable to afford jardiance Stop metformin due to constipation. Current use of lantus 15units and mounjaro 2.$RemoveBefore'5mg'rHdDXOzZxSRZL$   Repeat hgbA1c: 7.9% improve Repeat CMP: normal Maintain lantus dose Increase mounjaro dose to $Remov'5mg'RlBYwP$  weekly  Hyperlipidemia associated with type 2 diabetes mellitus (HCC) Elevated LDL: start zetia for cholesterol while waiting for appt with Dr. Debara Pickett. This is not a statin drug.  Essential hypertension BP at goal with losartan/hctz and amlodipine. BP Readings from Last 3 Encounters:  09/15/22 130/80  07/14/22 122/68  06/09/22 120/80   Maintain med doses Repeat CMP: normal  Wt Readings from Last 3 Encounters:  09/15/22 175 lb 3.2 oz (79.5 kg)  07/22/22 177 lb (80.3 kg)  07/14/22 176 lb 3.2 oz (79.9 kg)    BP Readings from Last 3 Encounters:  09/15/22 130/80  07/14/22 122/68  06/09/22 120/80    DM (diabetes mellitus) (HCC) Home glucose: 95, 121, 222,  Average glucose in 7days: 145 Average glucose in last 30day: 152, 140 Low events:5 in last 90days, 4events 12am-6am and 1event-3-6pm. Unable to afford jardiance Stop metformin due to constipation. Current use of lantus 15units and mounjaro 2.$RemoveBefore'5mg'qwCSGKioTtctg$   Repeat hgbA1c: 7.9% improve Repeat CMP: normal Maintain lantus dose Increase mounjaro dose to $Remov'5mg'XGAIfm$  weekly  Hyperlipidemia associated with type 2 diabetes mellitus (HCC) Elevated LDL: start zetia for cholesterol while waiting for appt  with Dr. Debara Pickett. This is not a statin drug.  Essential hypertension BP at goal with losartan/hctz and amlodipine. BP Readings from Last 3 Encounters:  09/15/22 130/80  07/14/22 122/68  06/09/22 120/80   Maintain med doses Repeat CMP: normal   Reviewed medical, surgical, and social history today  Medications: Outpatient Medications Prior to Visit  Medication Sig   amoxicillin (AMOXIL) 500 MG capsule Take 500 mg by mouth 3 (three) times daily.   aspirin EC 81 MG tablet Take 1 tablet (81 mg total) by mouth daily.   Blood Glucose Monitoring Suppl (TRUE METRIX METER) w/Device KIT 1 each by Does not apply route as needed.   Continuous Blood Gluc Sensor (FREESTYLE LIBRE 2 SENSOR) MISC 1 Units by Does not apply route every 14 (fourteen) days.   Elastic Bandages & Supports (FINGER SPLINT) MISC 1 each by Does not apply route daily. On Left thumb and ring finger   glucose blood (TRUE METRIX BLOOD GLUCOSE TEST) test strip 1 each by Other route 2 (two) times daily.   TRUEPLUS LANCETS 28G MISC 1 each by Does not apply route 2 (two) times daily.   [DISCONTINUED] amLODipine (NORVASC) 10 MG tablet Take 1 tablet (10 mg total) by mouth daily.   [DISCONTINUED] insulin glargine (LANTUS) 100 UNIT/ML injection Inject 15 Units into the skin daily.   [DISCONTINUED] losartan-hydrochlorothiazide (HYZAAR) 100-12.5 MG tablet Take 1 tablet by mouth daily.   [DISCONTINUED] tirzepatide [facility]) 2.5 MG/0.5ML Pen Inject 2.5 mg into the skin  once a week.   Insulin Pen Needle (PEN NEEDLES) 31G X 6 MM MISC 1 application. by Does not apply route at bedtime.   [DISCONTINUED] aspirin EC 81 MG tablet Take 162 mg by mouth daily. Swallow whole. (Patient not taking: Reported on 09/15/2022)   [DISCONTINUED] empagliflozin (JARDIANCE) 10 MG TABS tablet Take 1 tablet (10 mg total) by mouth daily before breakfast. (Patient not taking: Reported on 09/15/2022)   [DISCONTINUED] ibuprofen (ADVIL) 800 MG tablet Take 800 mg by mouth every  6 (six) hours as needed. (Patient not taking: Reported on 09/15/2022)   [DISCONTINUED] insulin glargine-yfgn (SEMGLEE) 100 UNIT/ML Pen Inject 10 Units into the skin at bedtime. (Patient not taking: Reported on 09/15/2022)   [DISCONTINUED] metFORMIN (GLUCOPHAGE-XR) 500 MG 24 hr tablet Take 1 tablet (500 mg total) by mouth daily with breakfast. (Patient not taking: Reported on 09/15/2022)   [DISCONTINUED] metFORMIN (GLUCOPHAGE-XR) 500 MG 24 hr tablet Take 1 tablet by mouth daily with breakfast. (Patient not taking: Reported on 09/15/2022)   No facility-administered medications prior to visit.   Reviewed past medical and social history.   ROS per HPI above      Objective:  BP 130/80 (BP Location: Left Arm, Patient Position: Sitting, Cuff Size: Small)   Pulse 78   Temp (!) 97.2 F (36.2 C) (Temporal)   Ht $R'4\' 11"'Jx$  (1.499 m)   Wt 175 lb 3.2 oz (79.5 kg)   SpO2 96%   BMI 35.39 kg/m      Physical Exam Constitutional:      Appearance: She is obese.  Cardiovascular:     Rate and Rhythm: Normal rate and regular rhythm.     Pulses: Normal pulses.     Heart sounds: Normal heart sounds.  Pulmonary:     Effort: Pulmonary effort is normal.     Breath sounds: Normal breath sounds.  Neurological:     Mental Status: She is alert and oriented to person, place, and time.  Psychiatric:        Mood and Affect: Mood normal.        Behavior: Behavior normal.        Thought Content: Thought content normal.     Results for orders placed or performed in visit on 09/15/22  Comprehensive metabolic panel  Result Value Ref Range   Sodium 139 135 - 145 mEq/L   Potassium 3.5 3.5 - 5.1 mEq/L   Chloride 102 96 - 112 mEq/L   CO2 30 19 - 32 mEq/L   Glucose, Bld 94 70 - 99 mg/dL   BUN 9 6 - 23 mg/dL   Creatinine, Ser 0.77 0.40 - 1.20 mg/dL   Total Bilirubin 0.4 0.2 - 1.2 mg/dL   Alkaline Phosphatase 74 39 - 117 U/L   AST 19 0 - 37 U/L   ALT 15 0 - 35 U/L   Total Protein 7.8 6.0 - 8.3 g/dL   Albumin  4.4 3.5 - 5.2 g/dL   GFR 87.48 >60.00 mL/min   Calcium 10.2 8.4 - 10.5 mg/dL  Lipid panel  Result Value Ref Range   Cholesterol 223 (H) 0 - 200 mg/dL   Triglycerides 80.0 0.0 - 149.0 mg/dL   HDL 48.60 >39.00 mg/dL   VLDL 16.0 0.0 - 40.0 mg/dL   LDL Cholesterol 158 (H) 0 - 99 mg/dL   Total CHOL/HDL Ratio 5    NonHDL 173.93   Hemoglobin A1c  Result Value Ref Range   Hgb A1c MFr Bld 7.9 (H) 4.6 - 6.5 %  Assessment & Plan:    Problem List Items Addressed This Visit       Cardiovascular and Mediastinum   Essential hypertension    BP at goal with losartan/hctz and amlodipine. BP Readings from Last 3 Encounters:  09/15/22 130/80  07/14/22 122/68  06/09/22 120/80   Maintain med doses Repeat CMP: normal      Relevant Medications   ezetimibe (ZETIA) 10 MG tablet   amLODipine (NORVASC) 10 MG tablet   losartan-hydrochlorothiazide (HYZAAR) 100-12.5 MG tablet   Other Relevant Orders   Comprehensive metabolic panel (Completed)     Endocrine   DM (diabetes mellitus) ([place]) - Primary    Home glucose: 95, 121, 222,  Average glucose in 7days: 145 Average glucose in last 30day: 152, 140 Low events:5 in last 90days, 4events 12am-6am and 1event-3-6pm. Unable to afford jardiance Stop metformin due to constipation. Current use of lantus 15units and mounjaro 2.$RemoveBefore'5mg'MTQlbNenjqSmJ$   Repeat hgbA1c: 7.9% improve Repeat CMP: normal Maintain lantus dose Increase mounjaro dose to $Remov'5mg'IkkVUq$  weekly      Relevant Medications   tirzepatide (MOUNJARO) 5 MG/0.5ML Pen   losartan-hydrochlorothiazide (HYZAAR) 100-12.5 MG tablet   insulin glargine (LANTUS) 100 UNIT/ML injection   Other Relevant Orders   Comprehensive metabolic panel (Completed)   Hemoglobin A1c (Completed)   Hyperlipidemia associated with type 2 diabetes mellitus (HCC)    Elevated LDL: start zetia for cholesterol while waiting for appt with Dr. Debara Pickett. This is not a statin drug.      Relevant Medications   tirzepatide (MOUNJARO) 5 MG/0.5ML  Pen   ezetimibe (ZETIA) 10 MG tablet   amLODipine (NORVASC) 10 MG tablet   losartan-hydrochlorothiazide (HYZAAR) 100-12.5 MG tablet   insulin glargine (LANTUS) 100 UNIT/ML injection   Other Relevant Orders   Comprehensive metabolic panel (Completed)   Lipid panel (Completed)   Return in about 3 months (around 12/15/2022) for DM, HTN, hyperlipidemia (fasting).     Wilfred Lacy, NP

## 2022-09-15 NOTE — Assessment & Plan Note (Signed)
Elevated LDL: start zetia for cholesterol while waiting for appt with Dr. Debara Pickett. This is not a statin drug.

## 2022-09-15 NOTE — Patient Instructions (Addendum)
Go to lab Have eye exam report sent to me

## 2022-09-15 NOTE — Assessment & Plan Note (Addendum)
Home glucose: 95, 121, 222,  Average glucose in 7days: 145 Average glucose in last 30day: 152, 140 Low events:5 in last 90days, 4events 12am-6am and 1event-3-6pm. Unable to afford jardiance Stop metformin due to constipation. Current use of lantus 15units and mounjaro 2.5mg   Repeat hgbA1c: 7.9% improve Repeat CMP: normal Maintain lantus dose Increase mounjaro dose to 5mg  weekly

## 2022-09-15 NOTE — Assessment & Plan Note (Signed)
BP at goal with losartan/hctz and amlodipine. BP Readings from Last 3 Encounters:  09/15/22 130/80  07/14/22 122/68  06/09/22 120/80   Maintain med doses Repeat CMP: normal

## 2022-09-27 ENCOUNTER — Encounter: Payer: Self-pay | Admitting: *Deleted

## 2022-09-27 NOTE — Progress Notes (Signed)
[facility] Quality Team Note  Name: [name] Date of Birth: 06-30-1966 MRN: [id number] Date: 09/27/2022  [facility] Quality Team has reviewed this patient's chart, please see recommendations below:  Colorectal Screening; Patient requests provider to schedule colonoscopy. Patient prefers to go to ([facility] or any GI that accepts her insurance) for colonoscopy. [facility] Quality Other; (Called pt since she has colonoscopy gap.  She would like referral to have completed.  )

## 2022-09-30 ENCOUNTER — Telehealth: Payer: Self-pay

## 2022-09-30 ENCOUNTER — Other Ambulatory Visit: Payer: Self-pay

## 2022-09-30 DIAGNOSIS — Z1211 Encounter for screening for malignant neoplasm of colon: Secondary | ICD-10-CM

## 2022-09-30 NOTE — Telephone Encounter (Signed)
Calling to inform pt the colorectal cancer screening has been ordered, and that she can call her insurance company to inquire about coverage. Once determined, she can call location of her choice to get scheduled.

## 2022-10-20 ENCOUNTER — Ambulatory Visit (HOSPITAL_BASED_OUTPATIENT_CLINIC_OR_DEPARTMENT_OTHER): Payer: PPO | Admitting: Internal Medicine

## 2022-10-20 ENCOUNTER — Telehealth: Payer: Self-pay | Admitting: Internal Medicine

## 2022-10-20 ENCOUNTER — Encounter (HOSPITAL_BASED_OUTPATIENT_CLINIC_OR_DEPARTMENT_OTHER): Payer: Self-pay | Admitting: Internal Medicine

## 2022-10-20 VITALS — BP 171/91 | HR 80 | Ht 59.0 in | Wt 179.1 lb

## 2022-10-20 DIAGNOSIS — Z8673 Personal history of transient ischemic attack (TIA), and cerebral infarction without residual deficits: Secondary | ICD-10-CM

## 2022-10-20 DIAGNOSIS — E785 Hyperlipidemia, unspecified: Secondary | ICD-10-CM

## 2022-10-20 DIAGNOSIS — I10 Essential (primary) hypertension: Secondary | ICD-10-CM

## 2022-10-20 DIAGNOSIS — E119 Type 2 diabetes mellitus without complications: Secondary | ICD-10-CM | POA: Diagnosis not present

## 2022-10-20 DIAGNOSIS — T466X5A Adverse effect of antihyperlipidemic and antiarteriosclerotic drugs, initial encounter: Secondary | ICD-10-CM

## 2022-10-20 DIAGNOSIS — M791 Myalgia, unspecified site: Secondary | ICD-10-CM

## 2022-10-20 NOTE — Telephone Encounter (Signed)
Patient seen in office 10/20/22 Dr. Debara Pickett has recommended a PCSK9i Dx: Stroke, Diabetes, Dyslipidemia

## 2022-10-20 NOTE — Patient Instructions (Signed)
Medication Instructions:  Dr. Debara Pickett recommends Repatha Sureclick or Praluent (PCSK9). This is an injectable cholesterol medication self-administered once every 14 days. This medication will likely need prior approval with your insurance company, which we will work on. If the medication is not approved initially, we may need to do an appeal with your insurance.   Administer medication in area of fatty tissue such as abdomen, outer thigh, back of upper arm - and rotate site with each injection Store medication in refrigerator until ready to administer - allow to sit at room temp for 30 mins - 1 hour prior to injection Dispose of medication in a SHARPS container - your pharmacy should be able to direct you on this and proper disposal   If you need a co-pay card for Repatha: MicroLists.at If you need a co-pay card for Praluent: [email or web address]://[email or web address].[email or web address].uy  Patient Assistance:    These foundations have funds at various times.   The [employer]: https://www.panfoundation.org/disease-funds/hypercholesterolemia/ -- can sign up for wait list  The [facility] offers assistance to help pay for medication copays.  They will cover copays for all cholesterol lowering meds, including statins, fibrates, omega-3 fish oils like Vascepa, ezetimibe, Repatha, Praluent, Nexletol, Nexlizet.  The cards are usually good for $2,500 or 12 months, whichever comes first. Go to healthwellfoundation.org Click on "Apply Now" Answer questions as to whom is applying (patient or representative) Your disease fund will be "hypercholesterolemia - Medicare access" They will ask questions about finances and which medications you are taking for cholesterol When you submit, the approval is usually within minutes.  You will need to print the card information from the site You will need to show this information to your pharmacy, they will bill your Medicare Part D plan first -then  bill Health Well --for the copay.   You can also call them at [phone], although the hold times can be quite long.     *If you need a refill on your cardiac medications before your next appointment, please call your pharmacy*   Lab Work: FASTING lab work to check cholesterol in about 3-4 months  If you have labs (blood work) drawn today and your tests are completely normal, you will receive your results only by: [place] (if you have MyChart) OR A paper copy in the mail If you have any lab test that is abnormal or we need to change your treatment, we will call you to review the results.   Testing/Procedures: NONE   Follow-Up: At [facility], you and your health needs are our priority.  As part of our continuing mission to provide you with exceptional heart care, we have created designated Provider Care Teams.  These Care Teams include your primary Cardiologist (physician) and Advanced Practice Providers (APPs -  Physician Assistants and Nurse Practitioners) who all work together to provide you with the care you need, when you need it.  We recommend signing up for the patient portal called "MyChart".  Sign up information is provided on this After Visit Summary.  MyChart is used to connect with patients for Virtual Visits (Telemedicine).  Patients are able to view lab/test results, encounter notes, upcoming appointments, etc.  Non-urgent messages can be sent to your provider as well.   To learn more about what you can do with MyChart, go to [email or web address].    Your next appointment:    3-4 months with Dr. Debara Pickett

## 2022-10-23 NOTE — Progress Notes (Signed)
LIPID CLINIC CONSULT NOTE  Chief Complaint:  Manage dyslipidemia  Primary Care Physician: Jordan Buffy, NP  Primary Cardiologist:  None  HPI:  [name] is a 56 y.o. female who is being seen today for the evaluation of dyslipidemia at the request of [name], Jordan Brooke, NP. This is a pleasant 56 yo female with an unfortunate history of ischemic stroke, HTN, DM2 and dyslipidemia, with statin intolerance, who is referred for management options of her dyslipidemia.  Her most recent lipid profile showed TC 223, TG 80, HDL 48 and LDL 158 with goal LDL <70 ( of if possible <55, given she is very high risk -prior stroke and diabetes).    PMHx:  Past Medical History:  Diagnosis Date   Diabetes mellitus without complication ([place]) 3785   previously on victoza    Gestational diabetes 2009    Hypertension 09/07/2014   Stroke ([place]) 09/07/2014   R side. weakness in upper arm. No deficit in leg. deficit in speech. A littel bit in coordination.     Past Surgical History:  Procedure Laterality Date   BREAST SURGERY     CESAREAN SECTION     TUBAL LIGATION  2009    FAMHx:  Family History  Problem Relation Age of Onset   Diabetes Mother    Hypertension Mother    Epilepsy Father    Hypertension Sister    Colon cancer Neg Hx     SOCHx:   reports that she has never smoked. She has never used smokeless tobacco. She reports current drug use. Drug: Marijuana. She reports that she does not drink alcohol.  ALLERGIES:  Allergies  Allergen Reactions   Lipitor [Atorvastatin] Other (See Comments)    Made me feel woozy   Lovastatin Other (See Comments)   Statins     ROS: Pertinent items noted in HPI and remainder of comprehensive ROS otherwise negative.  HOME MEDS: Current Outpatient Medications on File Prior to Visit  Medication Sig Dispense Refill   amLODipine (NORVASC) 10 MG tablet Take 1 tablet (10 mg total) by mouth daily. 90 tablet 0   amoxicillin (AMOXIL) 500 MG  capsule Take 500 mg by mouth 3 (three) times daily.     aspirin EC 81 MG tablet Take 1 tablet (81 mg total) by mouth daily. 100 tablet 1   Blood Glucose Monitoring Suppl (TRUE METRIX METER) w/Device KIT 1 each by Does not apply route as needed. 1 kit 0   Continuous Blood Gluc Sensor (FREESTYLE LIBRE 2 SENSOR) MISC 1 Units by Does not apply route every 14 (fourteen) days. 2 each 3   Elastic Bandages & Supports (FINGER SPLINT) MISC 1 each by Does not apply route daily. On Left thumb and ring finger 2 each 0   ezetimibe (ZETIA) 10 MG tablet Take 1 tablet (10 mg total) by mouth daily. 90 tablet 3   glucose blood (TRUE METRIX BLOOD GLUCOSE TEST) test strip 1 each by Other route 2 (two) times daily. 100 each 11   insulin glargine (LANTUS) 100 UNIT/ML injection Inject 0.15 mLs (15 Units total) into the skin daily. 10 mL 5   Insulin Pen Needle (PEN NEEDLES) 31G X 6 MM MISC 1 application. by Does not apply route at bedtime. 90 each 1   losartan-hydrochlorothiazide (HYZAAR) 100-12.5 MG tablet Take 1 tablet by mouth daily. 90 tablet 0   tirzepatide (MOUNJARO) 5 MG/0.5ML Pen Inject 5 mg into the skin once a week. 6 mL 1   TRUEPLUS  LANCETS 28G MISC 1 each by Does not apply route 2 (two) times daily. 100 each 11   No current facility-administered medications on file prior to visit.    LABS/IMAGING: No results found for this or any previous visit (from the past 48 hour(s)). No results found.  LIPID PANEL:    Component Value Date/Time   CHOL 223 (H) 09/15/2022 1203   TRIG 80.0 09/15/2022 1203   HDL 48.60 09/15/2022 1203   CHOLHDL 5 09/15/2022 1203   VLDL 16.0 09/15/2022 1203   LDLCALC 158 (H) 09/15/2022 1203    WEIGHTS: Wt Readings from Last 3 Encounters:  10/20/22 179 lb 1.6 oz (81.2 kg)  09/15/22 175 lb 3.2 oz (79.5 kg)  07/22/22 177 lb (80.3 kg)    VITALS: BP (!) 171/91 (BP Location: Left Arm, Patient Position: Sitting, Cuff Size: Normal)   Pulse 80   Ht 4' 11" (1.499 m)   Wt 179 lb 1.6  oz (81.2 kg)   SpO2 99%   BMI 36.17 kg/m   EXAM: Deferred  EKG: Deferred  ASSESSMENT: Mixed dyslipidemia, goal LDL <70 DM2 - A1c 7.9% Prior ischemic stroke HTN Statin intolerance - myalgias  PLAN: 1.   [name] has a history of dyslipidemia and remains well above target LDL <70 (or possibly <55). She has DM2, HTN and statin intolerance to 3 different statins. She is a good candidate for a PCSK9i and needs at least a 50% reduction in her LDL - will pursue a PA for this. Plan repeat lipid NMR and LP(a) in 3-4 months and follow-up then .  Thanks again for the kind referral.  Pixie Casino, MD, FACC, [place] Director of the [facility] Diplomate of the American Board of Clinical Lipidology Attending Cardiologist  Direct Dial: [phone]  Fax: [phone]  Website:  [email or web address] 10/23/2022, 8:43 PM

## 2022-10-24 NOTE — Telephone Encounter (Signed)
Key: [id number]

## 2022-10-25 MED ORDER — REPATHA SURECLICK 140 MG/ML ~~LOC~~ SOAJ: 140.0000 mg | SUBCUTANEOUS | 12 refills | Status: DC

## 2022-10-25 NOTE — Telephone Encounter (Signed)
PA approved to 04/24/23  Rx sent to [facility]

## 2022-11-14 ENCOUNTER — Telehealth: Payer: Self-pay | Admitting: Nurse Practitioner

## 2022-11-14 NOTE — Telephone Encounter (Signed)
Caller Name: Izzy Call back phone #: [phone]  Reason for Call: Pt states Greggory Keen is too expensive at $700. Pt said she got a coupon card from [place] to use and has called her insurance. Pt was advised since she has a Medicare Advantage plan she is unable to use the coupon card. (*For eligible commercially insured patients with [employer] coverage. Governmental beneficiaries excluded.)

## 2022-11-15 NOTE — Telephone Encounter (Signed)
Called pt & provided her the phone number to the [employer]'s savings card customer service number, also sent pt anther savings card through MyChart.

## 2022-11-16 ENCOUNTER — Telehealth: Payer: Self-pay | Admitting: Nurse Practitioner

## 2022-11-16 LAB — HM DIABETES EYE EXAM

## 2022-11-16 NOTE — Telephone Encounter (Signed)
Called & spoke w/ pt, says the coupon was for the wrong dose. Asked pt if she called the phone number that was given to her to see if they have a discount card for the 0.5. Pt said she forgot about having the number so she is going to call them later on to check.

## 2022-11-16 NOTE — Telephone Encounter (Signed)
Pt stated that you did not give her the coupon number

## 2022-11-16 NOTE — Telephone Encounter (Signed)
Pt called and would like for you to call her 

## 2022-11-16 NOTE — Telephone Encounter (Signed)
Pt would like for you to give her a call back.

## 2022-11-17 ENCOUNTER — Telehealth: Payer: Self-pay | Admitting: Nurse Practitioner

## 2022-11-17 NOTE — Telephone Encounter (Signed)
Called & provided pt with phone number to [facility] savings card.

## 2022-11-17 NOTE — Telephone Encounter (Signed)
Error

## 2022-11-17 NOTE — Telephone Encounter (Signed)
Pt said call her she did not get the medicine

## 2022-11-17 NOTE — Telephone Encounter (Signed)
Please call patient back, she is asking for the phone number for the discount cardd

## 2022-12-23 ENCOUNTER — Ambulatory Visit: Payer: PPO | Admitting: Nurse Practitioner

## 2022-12-23 DIAGNOSIS — E113512 Type 2 diabetes mellitus with proliferative diabetic retinopathy with macular edema, left eye: Secondary | ICD-10-CM | POA: Diagnosis not present

## 2022-12-28 DIAGNOSIS — E113511 Type 2 diabetes mellitus with proliferative diabetic retinopathy with macular edema, right eye: Secondary | ICD-10-CM | POA: Diagnosis not present

## 2022-12-29 ENCOUNTER — Telehealth: Payer: Self-pay | Admitting: Nurse Practitioner

## 2022-12-29 NOTE — Telephone Encounter (Signed)
Please call pt about one of her medications

## 2022-12-30 ENCOUNTER — Other Ambulatory Visit: Payer: Self-pay

## 2022-12-30 DIAGNOSIS — Z794 Long term (current) use of insulin: Secondary | ICD-10-CM

## 2022-12-30 MED ORDER — PEN NEEDLES 31G X 6 MM MISC: 1.0000 "application " | Freq: Every day | 1 refills | Status: AC

## 2022-12-30 NOTE — Telephone Encounter (Signed)
Called & spoke w. Pt, adv she has refills on all of her meds and some are at the [facility] pharmacy instead of [facility]. Refill for pen needles have been sent to the [facility] per pt's request.

## 2023-01-02 ENCOUNTER — Telehealth: Payer: Self-pay | Admitting: Nurse Practitioner

## 2023-01-02 DIAGNOSIS — E1169 Type 2 diabetes mellitus with other specified complication: Secondary | ICD-10-CM

## 2023-01-02 NOTE — Telephone Encounter (Signed)
Pt is saying she is needing a new FreeStyle Libre Continuous Glucose Monitoring machine,  [place], [place] [address] Phone: [phone]  Fax: [phone]

## 2023-01-03 ENCOUNTER — Other Ambulatory Visit: Payer: Self-pay

## 2023-01-05 ENCOUNTER — Other Ambulatory Visit: Payer: Self-pay

## 2023-01-05 MED ORDER — FREESTYLE LIBRE 2 SENSOR MISC: 1.0000 [IU] | 3 refills | Status: DC

## 2023-01-05 NOTE — Addendum Note (Signed)
Addended by: Wilfred Lacy L on: 01/05/2023 11:42 AM   Modules accepted: Orders

## 2023-01-20 ENCOUNTER — Ambulatory Visit: Payer: PPO | Admitting: Nurse Practitioner

## 2023-01-27 ENCOUNTER — Telehealth: Payer: Self-pay | Admitting: Nurse Practitioner

## 2023-01-27 DIAGNOSIS — E113512 Type 2 diabetes mellitus with proliferative diabetic retinopathy with macular edema, left eye: Secondary | ICD-10-CM | POA: Diagnosis not present

## 2023-01-27 NOTE — Telephone Encounter (Signed)
Pt is needing a PA for her  tirzepatide [facility]) 5 MG/0.5ML Pen [id number], she was told by her insurance company.  [place], [address], [place] [address] Phone: [phone]  Fax: [phone]  Pt at [phone]

## 2023-01-30 ENCOUNTER — Other Ambulatory Visit: Payer: Self-pay

## 2023-01-30 ENCOUNTER — Ambulatory Visit (INDEPENDENT_AMBULATORY_CARE_PROVIDER_SITE_OTHER): Payer: PPO | Admitting: Nurse Practitioner

## 2023-01-30 ENCOUNTER — Other Ambulatory Visit (HOSPITAL_COMMUNITY): Payer: Self-pay

## 2023-01-30 ENCOUNTER — Encounter: Payer: Self-pay | Admitting: Nurse Practitioner

## 2023-01-30 VITALS — BP 140/90 | HR 80 | Temp 97.7°F | Resp 16 | Ht 59.0 in | Wt 169.8 lb

## 2023-01-30 DIAGNOSIS — F411 Generalized anxiety disorder: Secondary | ICD-10-CM | POA: Diagnosis not present

## 2023-01-30 DIAGNOSIS — I10 Essential (primary) hypertension: Secondary | ICD-10-CM

## 2023-01-30 DIAGNOSIS — E1169 Type 2 diabetes mellitus with other specified complication: Secondary | ICD-10-CM

## 2023-01-30 DIAGNOSIS — Z1211 Encounter for screening for malignant neoplasm of colon: Secondary | ICD-10-CM | POA: Diagnosis not present

## 2023-01-30 DIAGNOSIS — E785 Hyperlipidemia, unspecified: Secondary | ICD-10-CM | POA: Diagnosis not present

## 2023-01-30 DIAGNOSIS — Z794 Long term (current) use of insulin: Secondary | ICD-10-CM | POA: Diagnosis not present

## 2023-01-30 DIAGNOSIS — E113511 Type 2 diabetes mellitus with proliferative diabetic retinopathy with macular edema, right eye: Secondary | ICD-10-CM | POA: Diagnosis not present

## 2023-01-30 LAB — HEPATIC FUNCTION PANEL
ALT: 14 U/L (ref 0–35)
AST: 17 U/L (ref 0–37)
Albumin: 4.3 g/dL (ref 3.5–5.2)
Alkaline Phosphatase: 76 U/L (ref 39–117)
Bilirubin, Direct: 0.1 mg/dL (ref 0.0–0.3)
Total Bilirubin: 0.5 mg/dL (ref 0.2–1.2)
Total Protein: 7 g/dL (ref 6.0–8.3)

## 2023-01-30 LAB — BASIC METABOLIC PANEL
BUN: 10 mg/dL (ref 6–23)
CO2: 28 mEq/L (ref 19–32)
Calcium: 10.1 mg/dL (ref 8.4–10.5)
Chloride: 102 mEq/L (ref 96–112)
Creatinine, Ser: 0.72 mg/dL (ref 0.40–1.20)
GFR: 94.57 mL/min (ref >60.00)
Glucose, Bld: 134 mg/dL — ABNORMAL HIGH (ref 70–99)
Potassium: 4 mEq/L (ref 3.5–5.1)
Sodium: 139 mEq/L (ref 135–145)

## 2023-01-30 LAB — LIPID PANEL
Cholesterol: 243 mg/dL — ABNORMAL HIGH (ref 0–200)
HDL: 44.9 mg/dL (ref >39.00)
LDL Cholesterol: 176 mg/dL — ABNORMAL HIGH (ref 0–99)
NonHDL: 197.95
Total CHOL/HDL Ratio: 5
Triglycerides: 111 mg/dL (ref 0.0–149.0)
VLDL: 22.2 mg/dL (ref 0.0–40.0)

## 2023-01-30 LAB — HEMOGLOBIN A1C: Hgb A1c MFr Bld: 9 % — ABNORMAL HIGH (ref 4.6–6.5)

## 2023-01-30 MED ORDER — METOPROLOL SUCCINATE ER 25 MG PO TB24: 25.0000 mg | ORAL_TABLET | Freq: Every day | ORAL | 1 refills | Status: DC

## 2023-01-30 MED ORDER — ROSUVASTATIN CALCIUM 10 MG PO TABS: 10.0000 mg | ORAL_TABLET | ORAL | 5 refills | Status: DC

## 2023-01-30 MED ORDER — MOUNJARO 7.5 MG/0.5ML ~~LOC~~ SOAJ: 7.5000 mg | SUBCUTANEOUS | 2 refills | Status: DC

## 2023-01-30 NOTE — Assessment & Plan Note (Signed)
Previous intolerance to lovastatin and atorvastatin: dizziness LDL not at goal with zetia. ASCVD risk at 20.8% Try crestor 62m weekly Entered referral to lipid clinic. F/up in 174month

## 2023-01-30 NOTE — Assessment & Plan Note (Signed)
Repeat hgba1c: 9.0% uncontrolled. Home fasting glucose: 130-140s Current use of mounjaro and lantus. Reports mild constipation with mounjaro 76m. Advised to start miralax 17g daily/ BP not at goal LDL not at goal No hx of gastroparesis or retinopathy.  Increased mounjaro dose to 7.559mweekly Maintain lantus dose Repeat labs in 109m21month

## 2023-01-30 NOTE — Assessment & Plan Note (Signed)
Chronic, waxing and waning due to strain with teenage son. Agreed to psychology referral

## 2023-01-30 NOTE — Patient Instructions (Addendum)
Go to lab Complete cologuard kit You will be contacted to schedule appt with psychology

## 2023-01-30 NOTE — Assessment & Plan Note (Signed)
Reports home BP 130s/80s Reports she is compliant with amlodipine and losartan/hctz dose BP Readings from Last 3 Encounters:  01/30/23 (!) 140/90  10/20/22 (!) 171/91  09/15/22 130/80    Add metoprolol 44m XR Maintain above med doses F/up in 167month

## 2023-01-30 NOTE — Progress Notes (Signed)
Established Patient Visit  Patient: [name]   DOB: [birth year]   57 y.o. Female  MRN: [id number] Visit Date: 01/30/2023  Subjective:    Chief Complaint  Patient presents with   Office visit    DM, HTN, Chol.   Fasting, denied TDAp and Shingles.   HPI Essential hypertension Reports home BP 130s/80s Reports she is compliant with amlodipine and losartan/hctz dose BP Readings from Last 3 Encounters:  01/30/23 (!) 140/90  10/20/22 (!) 171/91  09/15/22 130/80    Add metoprolol 47m XR Maintain above med doses F/up in 119monthGAD (generalized anxiety disorder) Chronic, waxing and waning due to strain with teenage son. Agreed to psychology referral  DM (diabetes mellitus), type 2 with complications ([place]angeRepeat hgba1c: 9.0% uncontrolled. Home fasting glucose: 130-140s Current use of mounjaro and lantus. Reports mild constipation with mounjaro 28m728mAdvised to start miralax 17g daily/ BP not at goal LDL not at goal No hx of gastroparesis or retinopathy.  Increased mounjaro dose to 7.28mg328mekly Maintain lantus dose Repeat labs in 3mon80monthperlipidemia associated with type 2 diabetes mellitus (HCC) Olivetvious intolerance to lovastatin and atorvastatin: dizziness LDL not at goal with zetia. ASCVD risk at 20.8% Try crestor 10mg 34mly Entered referral to lipid clinic. F/up in 37month637monthadings from Last 3 Encounters:  01/30/23 169 lb 12.8 oz (77 kg)  10/20/22 179 lb 1.6 oz (81.2 kg)  09/15/22 175 lb 3.2 oz (79.5 kg)   BP Readings from Last 3 Encounters:  01/30/23 (!) 140/90  10/20/22 (!) 171/91  09/15/22 130/80    Reviewed medical, surgical, and social history today  Medications: Outpatient Medications Prior to Visit  Medication Sig   amLODipine (NORVASC) 10 MG tablet Take 1 tablet (10 mg total) by mouth daily.   aspirin EC 81 MG tablet Take 1 tablet (81 mg total) by mouth daily.   Blood Glucose Monitoring Suppl (TRUE METRIX METER) w/Device  KIT 1 each by Does not apply route as needed.   Continuous Blood Gluc Sensor (FREESTYLE LIBRE 2 SENSOR) MISC 1 Units by Does not apply route every 14 (fourteen) days.   Elastic Bandages & Supports (FINGER SPLINT) MISC 1 each by Does not apply route daily. On Left thumb and ring finger   Evolocumab (REPATHA SURECLICK) 140 MG/[id number] Inject 140 mg into the skin every 14 (fourteen) days.   glucose blood (TRUE METRIX BLOOD GLUCOSE TEST) test strip 1 each by Other route 2 (two) times daily.   insulin glargine (LANTUS) 100 UNIT/ML injection Inject 0.15 mLs (15 Units total) into the skin daily.   Insulin Pen Needle (PEN NEEDLES) 31G X 6 MM MISC 1 application  by Does not apply route at bedtime.   losartan-hydrochlorothiazide (HYZAAR) 100-12.5 MG tablet Take 1 tablet by mouth daily.   TRUEPLUS LANCETS 28G MISC 1 each by Does not apply route 2 (two) times daily.   [DISCONTINUED] ezetimibe (ZETIA) 10 MG tablet Take 1 tablet (10 mg total) by mouth daily.   [DISCONTINUED] tirzepatide ([facility]ANoland Hospital [facility]0.5ML Pen Inject 5 mg into the skin once a week.   [DISCONTINUED] amoxicillin (AMOXIL) 500 MG capsule Take 500 mg by mouth 3 (three) times daily. (Patient not taking: Reported on 01/30/2023)   No facility-administered medications prior to visit.   Reviewed past medical and social history.   ROS per HPI above      Objective:  BP (!) 140/90  Pulse 80   Temp 97.7 F (36.5 C) (Temporal)   Resp 16   Ht 4' 11"$  (1.499 m)   Wt 169 lb 12.8 oz (77 kg)   LMP 05/19/2017   SpO2 97%   BMI 34.30 kg/m      Physical Exam  Results for orders placed or performed in visit on 01/30/23  Hemoglobin A1c  Result Value Ref Range   Hgb A1c MFr Bld 9.0 (H) 4.6 - 6.5 %  Hepatic function panel  Result Value Ref Range   Total Bilirubin 0.5 0.2 - 1.2 mg/dL   Bilirubin, Direct 0.1 0.0 - 0.3 mg/dL   Alkaline Phosphatase 76 39 - 117 U/L   AST 17 0 - 37 U/L   ALT 14 0 - 35 U/L   Total Protein 7.0 6.0 - 8.3 g/dL    Albumin 4.3 3.5 - 5.2 g/dL  Basic metabolic panel  Result Value Ref Range   Sodium 139 135 - 145 mEq/L   Potassium 4.0 3.5 - 5.1 mEq/L   Chloride 102 96 - 112 mEq/L   CO2 28 19 - 32 mEq/L   Glucose, Bld 134 (H) 70 - 99 mg/dL   BUN 10 6 - 23 mg/dL   Creatinine, Ser 0.72 0.40 - 1.20 mg/dL   GFR 94.57 >60.00 mL/min   Calcium 10.1 8.4 - 10.5 mg/dL  Lipid panel  Result Value Ref Range   Cholesterol 243 (H) 0 - 200 mg/dL   Triglycerides 111.0 0.0 - 149.0 mg/dL   HDL 44.90 >39.00 mg/dL   VLDL 22.2 0.0 - 40.0 mg/dL   LDL Cholesterol 176 (H) 0 - 99 mg/dL   Total CHOL/HDL Ratio 5    NonHDL 197.95       Assessment & Plan:    Problem List Items Addressed This Visit       Cardiovascular and Mediastinum   Essential hypertension - Primary    Reports home BP 130s/80s Reports she is compliant with amlodipine and losartan/hctz dose BP Readings from Last 3 Encounters:  01/30/23 (!) 140/90  10/20/22 (!) 171/91  09/15/22 130/80    Add metoprolol 47m XR Maintain above med doses F/up in 140month    Relevant Medications   metoprolol succinate (TOPROL-XL) 25 MG 24 hr tablet   rosuvastatin (CRESTOR) 10 MG tablet   Other Relevant Orders   Basic metabolic panel (Completed)     Endocrine   DM (diabetes mellitus), type 2 with complications ([facility] [place]   Repeat hgba1c: 9.0% uncontrolled. Home fasting glucose: 130-140s Current use of mounjaro and lantus. Reports mild constipation with mounjaro 45m61mAdvised to start miralax 17g daily/ BP not at goal LDL not at goal No hx of gastroparesis or retinopathy.  Increased mounjaro dose to 7.45mg3mekly Maintain lantus dose Repeat labs in 3mon2month  Relevant Medications   tirzepatide (MOUNJARO) 7.5 MG/0.5ML Pen   rosuvastatin (CRESTOR) 10 MG tablet   Hyperlipidemia associated with type 2 diabetes mellitus (HCC)    Previous intolerance to lovastatin and atorvastatin: dizziness LDL not at goal with zetia. ASCVD risk at 20.8% Try crestor 10mg 62mkly Entered referral to lipid clinic. F/up in 60month64monthelevant Medications   metoprolol succinate (TOPROL-XL) 25 MG 24 hr tablet   tirzepatide (MOUNJARO) 7.5 MG/0.5ML Pen   rosuvastatin (CRESTOR) 10 MG tablet   Other Relevant Orders   Hepatic function panel (Completed)   Lipid panel (Completed)   [facility]  Other   GAD (generalized anxiety disorder)    Chronic, waxing and waning due to strain with teenage son. Agreed to psychology referral      Relevant Orders   Ambulatory referral to Psychology   Other Visit Diagnoses     Colon cancer screening       Relevant Orders   Cologuard      Return in about 4 weeks (around 02/27/2023) for HTN.     Wilfred Lacy, NP

## 2023-01-30 NOTE — Telephone Encounter (Signed)
Patient Advocate Encounter  Prior Authorization for [facility] 58m/0.5ml has been approved.    PA# PA Case ID: [id number] Effective dates: 01/30/23 through 01/31/24  Placed a call to the pharmacy to notify of the approval.

## 2023-01-30 NOTE — Telephone Encounter (Signed)
Pharmacy Patient Advocate Encounter   Received notification from [facility] that prior authorization for [facility] 77m/0.5ml is required/requested.  Per Test Claim: Prior auth required   PA submitted on 01/30/23 to (ins) [employer] via CoverMyMeds Key [id number] Status is pending

## 2023-02-10 ENCOUNTER — Ambulatory Visit: Payer: PPO | Admitting: Internal Medicine

## 2023-03-02 DIAGNOSIS — E113512 Type 2 diabetes mellitus with proliferative diabetic retinopathy with macular edema, left eye: Secondary | ICD-10-CM | POA: Diagnosis not present

## 2023-03-06 DIAGNOSIS — E113511 Type 2 diabetes mellitus with proliferative diabetic retinopathy with macular edema, right eye: Secondary | ICD-10-CM | POA: Diagnosis not present

## 2023-04-04 ENCOUNTER — Telehealth: Payer: Self-pay | Admitting: Nurse Practitioner

## 2023-04-04 NOTE — Telephone Encounter (Signed)
Contacted [name] to schedule their annual wellness visit. Appointment made for 04/07/23.  Rudell Cobb AWV direct phone # [phone]

## 2023-04-07 ENCOUNTER — Ambulatory Visit (INDEPENDENT_AMBULATORY_CARE_PROVIDER_SITE_OTHER): Payer: PPO

## 2023-04-07 VITALS — Ht 59.0 in | Wt 162.0 lb

## 2023-04-07 DIAGNOSIS — Z0000 Encounter for general adult medical examination without abnormal findings: Secondary | ICD-10-CM

## 2023-04-07 NOTE — Progress Notes (Signed)
I connected with  [name] on 04/07/23 by a audio enabled telemedicine application and verified that I am speaking with the correct person using two identifiers.  Patient Location: Home  Provider Location: Office/Clinic  I discussed the limitations of evaluation and management by telemedicine. The patient expressed understanding and agreed to proceed.  Subjective:   [name] is a 57 y.o. female who presents for Medicare Annual (Subsequent) preventive examination.  Review of Systems     Cardiac Risk Factors include: advanced age (>61men, >31 women);diabetes mellitus;dyslipidemia;hypertension;obesity (BMI >30kg/m2)     Objective:    Today's Vitals   04/07/23 1326  Weight: 162 lb (73.5 kg)  Height:  (1.499 m)  PainSc: 6    Body mass index is 32.72 kg/m.     04/07/2023    1:34 PM 08/09/2022    4:04 PM 07/22/2022    3:03 PM 06/07/2022   12:41 PM 05/31/2020   10:49 PM 11/16/2018    9:59 AM 11/01/2018    8:07 AM  Advanced Directives  Does Patient Have a Medical Advance Directive? No Yes Yes Yes No No No  Type of Special educational needs teacher of [place];Living will  Healthcare Power of Attorney     Copy of Healthcare Power of Attorney in Chart?  No - copy requested  No - copy requested     Would patient like information on creating a medical advance directive?     No - Patient declined No - Patient declined No - Patient declined    Current Medications (verified) Outpatient Encounter Medications as of 04/07/2023  Medication Sig   amLODipine (NORVASC) 10 MG tablet Take 1 tablet (10 mg total) by mouth daily.   aspirin EC 81 MG tablet Take 1 tablet (81 mg total) by mouth daily.   Blood Glucose Monitoring Suppl (TRUE METRIX METER) w/Device KIT 1 each by Does not apply route as needed.   Elastic Bandages & Supports (FINGER SPLINT) MISC 1 each by Does not apply route daily. On Left thumb and ring finger   glucose blood (TRUE METRIX BLOOD GLUCOSE TEST) test strip  1 each by Other route 2 (two) times daily.   insulin glargine (LANTUS) 100 UNIT/ML injection Inject 0.15 mLs (15 Units total) into the skin daily.   Insulin Pen Needle (PEN NEEDLES) 31G X 6 MM MISC 1 application  by Does not apply route at bedtime.   losartan-hydrochlorothiazide (HYZAAR) 100-12.5 MG tablet Take 1 tablet by mouth daily.   metoprolol succinate (TOPROL-XL) 25 MG 24 hr tablet Take 1 tablet (25 mg total) by mouth daily.   tirzepatide (MOUNJARO) 7.5 MG/0.5ML Pen Inject 7.5 mg into the skin once a week.   TRUEPLUS LANCETS 28G MISC 1 each by Does not apply route 2 (two) times daily.   Continuous Blood Gluc Sensor (FREESTYLE LIBRE 2 SENSOR) MISC 1 Units by Does not apply route every 14 (fourteen) days. (Patient not taking: Reported on 04/07/2023)   Evolocumab (REPATHA SURECLICK) 140 MG/ML SOAJ Inject 140 mg into the skin every 14 (fourteen) days. (Patient not taking: Reported on 04/07/2023)   rosuvastatin (CRESTOR) 10 MG tablet Take 1 tablet (10 mg total) by mouth once a week. (Patient not taking: Reported on 04/07/2023)   No facility-administered encounter medications on file as of 04/07/2023.    Allergies (verified) Lipitor [atorvastatin], Lovastatin, and Statins   History: Past Medical History:  Diagnosis Date   Diabetes mellitus without complication 2013   previously on victoza  Gestational diabetes 2009    Hypertension 09/07/2014   Stroke 09/07/2014   R side. weakness in upper arm. No deficit in leg. deficit in speech. A littel bit in coordination.    Past Surgical History:  Procedure Laterality Date   BREAST SURGERY     CESAREAN SECTION     TUBAL LIGATION  2009   Family History  Problem Relation Age of Onset   Diabetes Mother    Hypertension Mother    Epilepsy Father    Hypertension Sister    Colon cancer Neg Hx    Social History   Socioeconomic History   Marital status: Married    Spouse name: Not on file   Number of children: 5   Years of education:  associates   Highest education level: Not on file  Occupational History   Occupation: disabled  Tobacco Use   Smoking status: Never   Smokeless tobacco: Never  Vaping Use   Vaping Use: Never used  Substance and Sexual Activity   Alcohol use: No   Drug use: Not Currently    Types: Marijuana   Sexual activity: Not Currently  Other Topics Concern   Not on file  Social History Narrative   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   Social Determinants of Health   Financial Resource Strain: Low Risk  (04/07/2023)   Overall Financial Resource Strain (CARDIA)    Difficulty of Paying Living Expenses: Not hard at all  Food Insecurity: No Food Insecurity (04/07/2023)   Hunger Vital Sign    Worried About Running Out of Food in the Last Year: Never true    Ran Out of Food in the Last Year: Never true  Transportation Needs: No Transportation Needs (04/07/2023)   PRAPARE - Administrator, Civil Service (Medical): No    Lack of Transportation (Non-Medical): No  Physical Activity: Sufficiently Active (04/07/2023)   Exercise Vital Sign    Days of Exercise per Week: 7 days    Minutes of Exercise per Session: 60 min  Stress: No Stress Concern Present (04/07/2023)   [employer] of Occupational Health - Occupational Stress Questionnaire    Feeling of Stress : Not at all  Social Connections: Moderately Isolated (08/09/2022)   Social Connection and Isolation Panel [NHANES]    Frequency of Communication with Friends and Family: Three times a week    Frequency of Social Gatherings with Friends and Family: Three times a week    Attends Religious Services: Never    Active Member of Clubs or Organizations: No    Attends Banker Meetings: Never    Marital Status: Married    Tobacco Counseling Counseling given: Not Answered   Clinical Intake:  Pre-visit preparation completed: Yes  Pain : 0-10 Pain Score: 6  Pain Type:  Chronic pain Pain Location: Finger (Comment which one) Pain Orientation: Mid, Left Pain Descriptors / Indicators: Aching Pain Onset: More than a month ago Pain Frequency: Intermittent     Nutritional Status: BMI > 30  Obese Nutritional Risks: None Diabetes: Yes  How often do you need to have someone help you when you read instructions, pamphlets, or other written materials from your doctor or pharmacy?: 1 - Never  Diabetic? Yes Nutrition Risk Assessment:  Has the patient had any N/V/D within the last 2 months?  No  Does the patient have any non-healing wounds?  No  Has the patient had any unintentional weight loss  or weight gain?  No   Diabetes:  Is the patient diabetic?  Yes  If diabetic, was a CBG obtained today?  No  Did the patient bring in their glucometer from home?  No  How often do you monitor your CBG's? Does not.   Financial Strains and Diabetes Management:  Are you having any financial strains with the device, your supplies or your medication? No .  Does the patient want to be seen by Chronic Care Management for management of their diabetes?  No  Would the patient like to be referred to a Nutritionist or for Diabetic Management?  No   Diabetic Exams:  Diabetic Eye Exam: Completed 11/16/2022 Diabetic Foot Exam: Completed 06/09/2022   Interpreter Needed?: No  Information entered by :: NAllen LPN   Activities of Daily Living    04/07/2023    1:37 PM 08/09/2022    4:10 PM  In your present state of health, do you have any difficulty performing the following activities:  Hearing? 0 0  Vision? 0 0  Difficulty concentrating or making decisions? 0 0  Walking or climbing stairs? 0 0  Dressing or bathing? 0 0  Doing errands, shopping? 0 0  Preparing Food and eating ? N N  Using the Toilet? N N  In the past six months, have you accidently leaked urine? N N  Do you have problems with loss of bowel control? N N  Managing your Medications? N N  Managing your  Finances? N N  Housekeeping or managing your Housekeeping? N N    Patient Care Team: Nche, Bonna Gains, NP as PCP - General (Internal Medicine) Maxie Better, MD as Consulting Physician (Obstetrics and Gynecology) Karena Addison, CNM as Midwife (Obstetrics and Gynecology)  Indicate any recent Medical Services you may have received from other than [facility] providers in the past year (date may be approximate).     Assessment:   This is a routine wellness examination for [name].  Hearing/Vision screen Vision Screening - Comments:: Regular eye exams, [facility], [facility]  Dietary issues and exercise activities discussed: Current Exercise Habits: Home exercise routine, Type of exercise: walking, Time (Minutes): 60, Frequency (Times/Week): 7, Weekly Exercise (Minutes/Week): 420   Goals Addressed             This Visit's Progress    Patient Stated       04/07/2023, wants to weigh 140 pounds and wants to get off medication       Depression Screen    04/07/2023    1:37 PM 01/30/2023    9:13 AM 08/09/2022    4:10 PM 08/09/2022    4:02 PM 07/22/2022    3:03 PM 08/02/2018    3:15 PM 04/05/2018    4:20 PM  PHQ 2/9 Scores  PHQ - 2 Score 0 0 0 0 0 0 1    Fall Risk    04/07/2023    1:37 PM 01/30/2023    9:12 AM 08/09/2022    4:05 PM 08/08/2022    4:50 PM 07/22/2022    3:03 PM  Fall Risk   Falls in the past year? 0 0 0 0 0  Number falls in past yr: 0  0 0   Injury with Fall? 0 0 0 0   Risk for fall due to : Medication side effect      Follow up Falls prevention discussed;Education provided;Falls evaluation completed  Falls evaluation completed;Education provided      FALL RISK PREVENTION  PERTAINING TO THE HOME:  Any stairs in or around the home? Yes  If so, are there any without handrails? No  Home free of loose throw rugs in walkways, pet beds, electrical cords, etc? Yes  Adequate lighting in your home to reduce risk of falls? Yes   ASSISTIVE DEVICES UTILIZED  TO PREVENT FALLS:  Life alert? No  Use of a cane, walker or w/c? No  Grab bars in the bathroom? No  Shower chair or bench in shower? No  Elevated toilet seat or a handicapped toilet? No   TIMED UP AND GO:  Was the test performed? No .      Cognitive Function:        04/07/2023    1:39 PM 08/09/2022    4:11 PM  6CIT Screen  What Year? 0 points 0 points  What month? 0 points 0 points  What time? 0 points 0 points  Count back from 20 0 points 0 points  Months in reverse 0 points 0 points  Repeat phrase 0 points 0 points  Total Score 0 points 0 points    Immunizations Immunization History  Administered Date(s) Administered   Influenza,inj,Quad PF,6+ Mos 10/06/2021   Janssen (J&J) SARS-COV-2 Vaccination 09/21/2020   PPD Test 05/09/2022    TDAP status: n/a  Flu Vaccine status: Up to date  Pneumococcal vaccine status: Up to date  Covid-19 vaccine status: Completed vaccines  Qualifies for Shingles Vaccine? Yes   Zostavax completed No   Shingrix Completed?: No.    Education has been provided regarding the importance of this vaccine. Patient has been advised to call insurance company to determine out of pocket expense if they have not yet received this vaccine. Advised may also receive vaccine at local pharmacy or Health Dept. Verbalized acceptance and understanding.  Screening Tests Health Maintenance  Topic Date Due   COVID-19 Vaccine (2 - Janssen risk series) 10/19/2020   Fecal DNA (Cologuard)  11/18/2022   MAMMOGRAM  02/03/2023   PAP SMEAR-Modifier  05/09/2023   Zoster Vaccines- Shingrix (1 of 2) 04/30/2023 (Originally [birth year]1987)   Hepatitis C Screening  01/31/2024 (Originally [birth year]1986)   Diabetic kidney evaluation - Urine ACR  05/25/2023   FOOT EXAM  06/10/2023   INFLUENZA VACCINE  07/20/2023   HEMOGLOBIN A1C  07/31/2023   Medicare Annual Wellness (AWV)  08/10/2023   OPHTHALMOLOGY EXAM  11/17/2023   Diabetic kidney evaluation - eGFR measurement  01/31/2024    HIV Screening  Completed   HPV VACCINES  Aged Out   DTaP/Tdap/Td  Discontinued    Health Maintenance  Health Maintenance Due  Topic Date Due   COVID-19 Vaccine (2 - Janssen risk series) 10/19/2020   Fecal DNA (Cologuard)  11/18/2022   MAMMOGRAM  02/03/2023   PAP SMEAR-Modifier  05/09/2023    Colorectal cancer screening: Type of screening: Cologuard. Completed 11/19/2019. Repeat every 3 years  Mammogram status: Completed 02/03/2022. Repeat every year  Bone Density status: n/a  Lung Cancer Screening: (Low Dose CT Chest recommended if Age 53-80 years, 30 pack-year currently smoking OR have quit w/in 15years.) does not qualify.   Lung Cancer Screening Referral: no  Additional Screening:  Hepatitis C Screening: does not qualify;   Vision Screening: Recommended annual ophthalmology exams for early detection of glaucoma and other disorders of the eye. Is the patient up to date with their annual eye exam?  Yes  Who is the provider or what is the name of the office in which the patient attends annual  eye exams? [facility] If pt is not established with a provider, would they like to be referred to a provider to establish care? No .   Dental Screening: Recommended annual dental exams for proper oral hygiene  Community Resource Referral / Chronic Care Management: CRR required this visit?  No   CCM required this visit?  No      Plan:     I have personally reviewed and noted the following in the patient's chart:   Medical and social history Use of alcohol, tobacco or illicit drugs  Current medications and supplements including opioid prescriptions. Patient is not currently taking opioid prescriptions. Functional ability and status Nutritional status Physical activity Advanced directives List of other physicians Hospitalizations, surgeries, and ER visits in previous 12 months Vitals Screenings to include cognitive, depression, and falls Referrals and appointments  In  addition, I have reviewed and discussed with patient certain preventive protocols, quality metrics, and best practice recommendations. A written personalized care plan for preventive services as well as general preventive health recommendations were provided to patient.     Barb Merino, LPN   1/61/0960   Nurse Notes: none  Due to this being a virtual visit, the after visit summary with patients personalized plan was offered to patient via mail or my-chart. Patient would like to access on my-chart

## 2023-04-07 NOTE — Patient Instructions (Signed)
[name] , Thank you for taking time to come for your Medicare Wellness Visit. I appreciate your ongoing commitment to your health goals. Please review the following plan we discussed and let me know if I can assist you in the future.   These are the goals we discussed:  Goals      Blood Pressure < 130/80     Exercise 3x per week (30 min per time)     HEMOGLOBIN A1C < 7.0     Patient Stated     04/07/2023, wants to weigh 140 pounds and wants to get off medication        This is a list of the screening recommended for you and due dates:  Health Maintenance  Topic Date Due   COVID-19 Vaccine (2 - Janssen risk series) 10/19/2020   Cologuard (Stool DNA test)  11/18/2022   Mammogram  02/03/2023   Pap Smear  05/09/2023   Zoster (Shingles) Vaccine (1 of 2) 04/30/2023*   Hepatitis C Screening: USPSTF Recommendation to screen - Ages 18-79 yo.  01/31/2024*   Yearly kidney health urinalysis for diabetes  05/25/2023   Complete foot exam   06/10/2023   Flu Shot  07/20/2023   Hemoglobin A1C  07/31/2023   Eye exam for diabetics  11/17/2023   Yearly kidney function blood test for diabetes  01/31/2024   Medicare Annual Wellness Visit  04/06/2024   HIV Screening  Completed   HPV Vaccine  Aged Out   DTaP/Tdap/Td vaccine  Discontinued  *Topic was postponed. The date shown is not the original due date.    Advanced directives: Advance directive discussed with you today.   Conditions/risks identified: none  Next appointment: Follow up in one year for your annual wellness visit.    Preventive Care 40-64 Years, Female Preventive care refers to lifestyle choices and visits with your health care provider that can promote health and wellness. What does preventive care include? A yearly physical exam. This is also called an annual well check. Dental exams once or twice a year. Routine eye exams. Ask your health care provider how often you should have your eyes checked. Personal lifestyle  choices, including: Daily care of your teeth and gums. Regular physical activity. Eating a healthy diet. Avoiding tobacco and drug use. Limiting alcohol use. Practicing safe sex. Taking low-dose aspirin daily starting at age 63. Taking vitamin and mineral supplements as recommended by your health care provider. What happens during an annual well check? The services and screenings done by your health care provider during your annual well check will depend on your age, overall health, lifestyle risk factors, and family history of disease. Counseling  Your health care provider may ask you questions about your: Alcohol use. Tobacco use. Drug use. Emotional well-being. Home and relationship well-being. Sexual activity. Eating habits. Work and work Astronomer. Method of birth control. Menstrual cycle. Pregnancy history. Screening  You may have the following tests or measurements: Height, weight, and BMI. Blood pressure. Lipid and cholesterol levels. These may be checked every 5 years, or more frequently if you are over 63 years old. Skin check. Lung cancer screening. You may have this screening every year starting at age 56 if you have a 30-pack-year history of smoking and currently smoke or have quit within the past 15 years. Fecal occult blood test (FOBT) of the stool. You may have this test every year starting at age 33. Flexible sigmoidoscopy or colonoscopy. You may have a sigmoidoscopy every 5 years or  a colonoscopy every 10 years starting at age 52. Hepatitis C blood test. Hepatitis B blood test. Sexually transmitted disease (STD) testing. Diabetes screening. This is done by checking your blood sugar (glucose) after you have not eaten for a while (fasting). You may have this done every 1-3 years. Mammogram. This may be done every 1-2 years. Talk to your health care provider about when you should start having regular mammograms. This may depend on whether you have a family  history of breast cancer. BRCA-related cancer screening. This may be done if you have a family history of breast, ovarian, tubal, or peritoneal cancers. Pelvic exam and Pap test. This may be done every 3 years starting at age 41. Starting at age 52, this may be done every 5 years if you have a Pap test in combination with an HPV test. Bone density scan. This is done to screen for osteoporosis. You may have this scan if you are at high risk for osteoporosis. Discuss your test results, treatment options, and if necessary, the need for more tests with your health care provider. Vaccines  Your health care provider may recommend certain vaccines, such as: Influenza vaccine. This is recommended every year. Tetanus, diphtheria, and acellular pertussis (Tdap, Td) vaccine. You may need a Td booster every 10 years. Zoster vaccine. You may need this after age 67. Pneumococcal 13-valent conjugate (PCV13) vaccine. You may need this if you have certain conditions and were not previously vaccinated. Pneumococcal polysaccharide (PPSV23) vaccine. You may need one or two doses if you smoke cigarettes or if you have certain conditions. Talk to your health care provider about which screenings and vaccines you need and how often you need them. This information is not intended to replace advice given to you by your health care provider. Make sure you discuss any questions you have with your health care provider. Document Released: 01/01/2016 Document Revised: 08/24/2016 Document Reviewed: 10/06/2015 Elsevier Interactive Patient Education  2017 [place] Prevention in the Home Falls can cause injuries. They can happen to people of all ages. There are many things you can do to make your home safe and to help prevent falls. What can I do on the outside of my home? Regularly fix the edges of walkways and driveways and fix any cracks. Remove anything that might make you trip as you walk through a door, such  as a raised step or threshold. Trim any bushes or trees on the path to your home. Use bright outdoor lighting. Clear any walking paths of anything that might make someone trip, such as rocks or tools. Regularly check to see if handrails are loose or broken. Make sure that both sides of any steps have handrails. Any raised decks and porches should have guardrails on the edges. Have any leaves, snow, or ice cleared regularly. Use sand or salt on walking paths during winter. Clean up any spills in your garage right away. This includes oil or grease spills. What can I do in the bathroom? Use night lights. Install grab bars by the toilet and in the tub and shower. Do not use towel bars as grab bars. Use non-skid mats or decals in the tub or shower. If you need to sit down in the shower, use a plastic, non-slip stool. Keep the floor dry. Clean up any water that spills on the floor as soon as it happens. Remove soap buildup in the tub or shower regularly. Attach bath mats securely with double-sided non-slip rug  tape. Do not have throw rugs and other things on the floor that can make you trip. What can I do in the bedroom? Use night lights. Make sure that you have a light by your bed that is easy to reach. Do not use any sheets or blankets that are too big for your bed. They should not hang down onto the floor. Have a firm chair that has side arms. You can use this for support while you get dressed. Do not have throw rugs and other things on the floor that can make you trip. What can I do in the kitchen? Clean up any spills right away. Avoid walking on wet floors. Keep items that you use a lot in easy-to-reach places. If you need to reach something above you, use a strong step stool that has a grab bar. Keep electrical cords out of the way. Do not use floor polish or wax that makes floors slippery. If you must use wax, use non-skid floor wax. Do not have throw rugs and other things on the  floor that can make you trip. What can I do with my stairs? Do not leave any items on the stairs. Make sure that there are handrails on both sides of the stairs and use them. Fix handrails that are broken or loose. Make sure that handrails are as long as the stairways. Check any carpeting to make sure that it is firmly attached to the stairs. Fix any carpet that is loose or worn. Avoid having throw rugs at the top or bottom of the stairs. If you do have throw rugs, attach them to the floor with carpet tape. Make sure that you have a light switch at the top of the stairs and the bottom of the stairs. If you do not have them, ask someone to add them for you. What else can I do to help prevent falls? Wear shoes that: Do not have high heels. Have rubber bottoms. Are comfortable and fit you well. Are closed at the toe. Do not wear sandals. If you use a stepladder: Make sure that it is fully opened. Do not climb a closed stepladder. Make sure that both sides of the stepladder are locked into place. Ask someone to hold it for you, if possible. Clearly mark and make sure that you can see: Any grab bars or handrails. First and last steps. Where the edge of each step is. Use tools that help you move around (mobility aids) if they are needed. These include: Canes. Walkers. Scooters. Crutches. Turn on the lights when you go into a dark area. Replace any light bulbs as soon as they burn out. Set up your furniture so you have a clear path. Avoid moving your furniture around. If any of your floors are uneven, fix them. If there are any pets around you, be aware of where they are. Review your medicines with your doctor. Some medicines can make you feel dizzy. This can increase your chance of falling. Ask your doctor what other things that you can do to help prevent falls. This information is not intended to replace advice given to you by your health care provider. Make sure you discuss any questions  you have with your health care provider. Document Released: 10/01/2009 Document Revised: 05/12/2016 Document Reviewed: 01/09/2015 Elsevier Interactive Patient Education  2017 [employer].

## 2023-04-13 ENCOUNTER — Ambulatory Visit: Payer: PPO | Attending: Internal Medicine | Admitting: Internal Medicine

## 2023-04-13 ENCOUNTER — Encounter: Payer: Self-pay | Admitting: Internal Medicine

## 2023-04-13 VITALS — BP 118/74 | HR 88 | Ht 60.0 in | Wt 167.4 lb

## 2023-04-13 DIAGNOSIS — E119 Type 2 diabetes mellitus without complications: Secondary | ICD-10-CM | POA: Diagnosis not present

## 2023-04-13 DIAGNOSIS — T466X5A Adverse effect of antihyperlipidemic and antiarteriosclerotic drugs, initial encounter: Secondary | ICD-10-CM

## 2023-04-13 DIAGNOSIS — T466X5D Adverse effect of antihyperlipidemic and antiarteriosclerotic drugs, subsequent encounter: Secondary | ICD-10-CM

## 2023-04-13 DIAGNOSIS — E785 Hyperlipidemia, unspecified: Secondary | ICD-10-CM | POA: Diagnosis not present

## 2023-04-13 DIAGNOSIS — Z8673 Personal history of transient ischemic attack (TIA), and cerebral infarction without residual deficits: Secondary | ICD-10-CM | POA: Diagnosis not present

## 2023-04-13 DIAGNOSIS — M791 Myalgia, unspecified site: Secondary | ICD-10-CM | POA: Diagnosis not present

## 2023-04-13 NOTE — Patient Instructions (Addendum)
Medication Instructions:  Talk to your primary care provider about your blood pressure medications  *If you need a refill on your cardiac medications before your next appointment, please call your pharmacy*  Follow-Up: At [facility], you and your health needs are our priority.  As part of our continuing mission to provide you with exceptional heart care, we have created designated Provider Care Teams.  These Care Teams include your primary Cardiologist (physician) and Advanced Practice Providers (APPs -  Physician Assistants and Nurse Practitioners) who all work together to provide you with the care you need, when you need it.  We recommend signing up for the patient portal called "MyChart".  Sign up information is provided on this After Visit Summary.  MyChart is used to connect with patients for Virtual Visits (Telemedicine).  Patients are able to view lab/test results, encounter notes, upcoming appointments, etc.  Non-urgent messages can be sent to your provider as well.   To learn more about what you can do with MyChart, go to [email or web address].    Your next appointment:   Dr. Rennis Golden recommends that you schedule a follow up visit with him the in the LIPID CLINIC in 4 months. Please have fasting blood work about 1 week prior to this visit and he will review the blood work results with you at your appointment.     The [facility] offers assistance to help pay for medication copays.  They will cover copays for all cholesterol lowering meds, including statins, fibrates, omega-3 fish oils like Vascepa, ezetimibe, Repatha, Praluent, Nexletol, Nexlizet.  The cards are usually good for $2,500 or 12 months, whichever comes first. Go to healthwellfoundation.org Click on "Apply Now" Answer questions as to whom is applying (patient or representative) Your disease fund will be "hypercholesterolemia - Medicare access" They will ask questions about finances and which medications  you are taking for cholesterol When you submit, the approval is usually within minutes.  You will need to print the card information from the site You will need to show this information to your pharmacy, they will bill your Medicare Part D plan first -then bill Health Well --for the copay.   You can also call them at [phone], although the hold times can be quite long.

## 2023-04-13 NOTE — Progress Notes (Signed)
LIPID CLINIC CONSULT NOTE  Chief Complaint:  Follow-up dyslipidemia  Primary Care Physician: Nche, Bonna Gains, NP  Primary Cardiologist:  None  HPI:  [name] is a 57 y.o. female who is being seen today for the evaluation of dyslipidemia at the request of Nche, Bonna Gains, NP. This is a pleasant 56 yo female with an unfortunate history of ischemic stroke, HTN, DM2 and dyslipidemia, with statin intolerance, who is referred for management options of her dyslipidemia.  Her most recent lipid profile showed TC 223, TG 80, HDL 48 and LDL 158 with goal LDL <70 ( of if possible <55, given she is very high risk -prior stroke and diabetes).   04/13/2023  [name] is seen today in follow-up.  Unfortunately her cholesterol continues to go up despite the fact that she is lost some weight and is on Mounjaro.  She was approved for Repatha back in November 2023 and the prescription was sent to [facility] however it was never filled due to concern about cost issues.  Her cholesterol has gone up with a total now 243, triglycerides 111, HDL 44 and LDL 176.  Previously her LDL was 158 and prior to that 138.  Although she is managed to try to cut back carbohydrates significantly in her diet she has increased some saturated fat intake including butter.  This might explain the increase in her cholesterol.   PMHx:  Past Medical History:  Diagnosis Date   Diabetes mellitus without complication 2013   previously on victoza    Gestational diabetes 2009    Hypertension 09/07/2014   Stroke 09/07/2014   R side. weakness in upper arm. No deficit in leg. deficit in speech. A littel bit in coordination.     Past Surgical History:  Procedure Laterality Date   BREAST SURGERY     CESAREAN SECTION     TUBAL LIGATION  2009    FAMHx:  Family History  Problem Relation Age of Onset   Diabetes Mother    Hypertension Mother    Epilepsy Father    Hypertension Sister    Colon cancer Neg Hx     SOCHx:    reports that she has never smoked. She has never used smokeless tobacco. She reports that she does not currently use drugs after having used the following drugs: Marijuana. She reports that she does not drink alcohol.  ALLERGIES:  Allergies  Allergen Reactions   Lipitor [Atorvastatin] Other (See Comments)    Made me feel woozy   Lovastatin Other (See Comments)   Statins     ROS: Pertinent items noted in HPI and remainder of comprehensive ROS otherwise negative.  HOME MEDS: Current Outpatient Medications on File Prior to Visit  Medication Sig Dispense Refill   amLODipine (NORVASC) 10 MG tablet Take 1 tablet (10 mg total) by mouth daily. 90 tablet 0   aspirin EC 81 MG tablet Take 1 tablet (81 mg total) by mouth daily. 100 tablet 1   Blood Glucose Monitoring Suppl (TRUE METRIX METER) w/Device KIT 1 each by Does not apply route as needed. 1 kit 0   Continuous Blood Gluc Sensor (FREESTYLE LIBRE 2 SENSOR) MISC 1 Units by Does not apply route every 14 (fourteen) days. 2 each 3   Elastic Bandages & Supports (FINGER SPLINT) MISC 1 each by Does not apply route daily. On Left thumb and ring finger 2 each 0   glucose blood (TRUE METRIX BLOOD GLUCOSE TEST) test strip 1 each by Other route  2 (two) times daily. 100 each 11   losartan-hydrochlorothiazide (HYZAAR) 100-12.5 MG tablet Take 1 tablet by mouth daily. 90 tablet 0   metoprolol succinate (TOPROL-XL) 25 MG 24 hr tablet Take 1 tablet (25 mg total) by mouth daily. 90 tablet 1   tirzepatide (MOUNJARO) 7.5 MG/0.5ML Pen Inject 7.5 mg into the skin once a week. 2 mL 2   TRUEPLUS LANCETS 28G MISC 1 each by Does not apply route 2 (two) times daily. 100 each 11   Evolocumab (REPATHA SURECLICK) 140 MG/ML SOAJ Inject 140 mg into the skin every 14 (fourteen) days. (Patient not taking: Reported on 04/13/2023) 2 mL 12   insulin glargine (LANTUS) 100 UNIT/ML injection Inject 0.15 mLs (15 Units total) into the skin daily. (Patient not taking: Reported on  04/13/2023) 10 mL 5   Insulin Pen Needle (PEN NEEDLES) 31G X 6 MM MISC 1 application  by Does not apply route at bedtime. (Patient not taking: Reported on 04/13/2023) 90 each 1   rosuvastatin (CRESTOR) 10 MG tablet Take 1 tablet (10 mg total) by mouth once a week. (Patient not taking: Reported on 04/13/2023) 4 tablet 5   No current facility-administered medications on file prior to visit.    LABS/IMAGING: No results found for this or any previous visit (from the past 48 hour(s)). No results found.  LIPID PANEL:    Component Value Date/Time   CHOL 243 (H) 01/30/2023 1013   TRIG 111.0 01/30/2023 1013   HDL 44.90 01/30/2023 1013   CHOLHDL 5 01/30/2023 1013   VLDL 22.2 01/30/2023 1013   LDLCALC 176 (H) 01/30/2023 1013    WEIGHTS: Wt Readings from Last 3 Encounters:  04/13/23 167 lb 6.4 oz (75.9 kg)  04/07/23 162 lb (73.5 kg)  01/30/23 169 lb 12.8 oz (77 kg)    VITALS: BP 118/74 (BP Location: Right Arm, Patient Position: Sitting, Cuff Size: Normal)   Pulse 88   Ht 5' (1.524 m)   Wt 167 lb 6.4 oz (75.9 kg)   LMP 05/19/2017   SpO2 96%   BMI 32.69 kg/m   EXAM: Deferred  EKG: Deferred  ASSESSMENT: Mixed dyslipidemia, goal LDL <70 DM2 - A1c 7.9% Prior ischemic stroke HTN Statin intolerance - myalgias  PLAN: 1.   [name] remains even higher than target LDL less than 70.  She is lost weight and made dietary changes but may have increased some fat in her diet and her LDL is even higher.  She was approved for Repatha but did not get the prescription filled due to cost.  She is Medicare disability and might qualify for health well foundation grant.  Will provide her for that information I encouraged her to apply for it.  Ultimately this medication is very important and I would encourage her to get the prescription filled and start using it.  If so we could follow-up with a repeat lipid in about 3 to 4 months.  Chrystie Nose, MD, [facility], FACP  [place]  [facility]   Medical Director of the Advanced Lipid Disorders &  Cardiovascular Risk Reduction Clinic Diplomate of the American Board of Clinical Lipidology Attending Cardiologist  Direct Dial: [phone]  Fax: [phone]  Website:  [email or web address] Nicely Kadarrius Yanke 04/13/2023, 4:05 PM

## 2023-04-14 ENCOUNTER — Telehealth: Payer: Self-pay | Admitting: Internal Medicine

## 2023-04-14 NOTE — Telephone Encounter (Signed)
Patient is requesting call back to discuss [employer] Well I.D. She states she is trying to set this up, but unable to get past this part. Requesting call back to discuss.

## 2023-04-14 NOTE — Telephone Encounter (Signed)
Spoke with patient. She is working on her Occupational psychologist. She needs our fax #. This has been provided. No additional assistance needed at this time

## 2023-04-14 NOTE — Telephone Encounter (Signed)
Patient stated she is having some difficulty setting up an account with [employer]. She mentioned the costumer service opens at 0900. Pt was advised to call the clinic if she has an additional questions. Pt voiced understanding.

## 2023-04-24 ENCOUNTER — Other Ambulatory Visit: Payer: Self-pay

## 2023-04-24 DIAGNOSIS — Z794 Long term (current) use of insulin: Secondary | ICD-10-CM

## 2023-04-24 MED ORDER — INSULIN GLARGINE 100 UNIT/ML ~~LOC~~ SOLN: 15.0000 [IU] | Freq: Every day | SUBCUTANEOUS | 5 refills | Status: DC

## 2023-04-28 ENCOUNTER — Other Ambulatory Visit: Payer: Self-pay

## 2023-04-28 ENCOUNTER — Other Ambulatory Visit: Payer: Self-pay | Admitting: Nurse Practitioner

## 2023-04-28 DIAGNOSIS — E1169 Type 2 diabetes mellitus with other specified complication: Secondary | ICD-10-CM

## 2023-04-28 MED ORDER — INSULIN GLARGINE 100 UNIT/ML SOLOSTAR PEN: 15.0000 [IU] | PEN_INJECTOR | Freq: Every day | SUBCUTANEOUS | 5 refills | Status: DC

## 2023-05-29 ENCOUNTER — Other Ambulatory Visit: Payer: Self-pay | Admitting: Nurse Practitioner

## 2023-05-29 DIAGNOSIS — E1169 Type 2 diabetes mellitus with other specified complication: Secondary | ICD-10-CM

## 2023-05-31 DIAGNOSIS — E113512 Type 2 diabetes mellitus with proliferative diabetic retinopathy with macular edema, left eye: Secondary | ICD-10-CM | POA: Diagnosis not present

## 2023-06-02 DIAGNOSIS — E119 Type 2 diabetes mellitus without complications: Secondary | ICD-10-CM | POA: Diagnosis not present

## 2023-06-02 DIAGNOSIS — I10 Essential (primary) hypertension: Secondary | ICD-10-CM | POA: Diagnosis not present

## 2023-06-02 DIAGNOSIS — R42 Dizziness and giddiness: Secondary | ICD-10-CM | POA: Diagnosis not present

## 2023-06-06 DIAGNOSIS — E113511 Type 2 diabetes mellitus with proliferative diabetic retinopathy with macular edema, right eye: Secondary | ICD-10-CM | POA: Diagnosis not present

## 2023-06-24 ENCOUNTER — Other Ambulatory Visit: Payer: Self-pay | Admitting: Nurse Practitioner

## 2023-06-24 DIAGNOSIS — Z794 Long term (current) use of insulin: Secondary | ICD-10-CM

## 2023-07-21 ENCOUNTER — Ambulatory Visit: Payer: PPO | Attending: Internal Medicine | Admitting: Internal Medicine

## 2023-07-24 ENCOUNTER — Encounter: Payer: Self-pay | Admitting: Internal Medicine

## 2023-08-01 DIAGNOSIS — E113511 Type 2 diabetes mellitus with proliferative diabetic retinopathy with macular edema, right eye: Secondary | ICD-10-CM | POA: Diagnosis not present

## 2023-08-08 DIAGNOSIS — E113512 Type 2 diabetes mellitus with proliferative diabetic retinopathy with macular edema, left eye: Secondary | ICD-10-CM | POA: Diagnosis not present

## 2023-08-18 ENCOUNTER — Other Ambulatory Visit: Payer: Self-pay | Admitting: Nurse Practitioner

## 2023-08-18 DIAGNOSIS — E1169 Type 2 diabetes mellitus with other specified complication: Secondary | ICD-10-CM

## 2023-10-10 DIAGNOSIS — E113511 Type 2 diabetes mellitus with proliferative diabetic retinopathy with macular edema, right eye: Secondary | ICD-10-CM | POA: Diagnosis not present

## 2023-10-11 ENCOUNTER — Other Ambulatory Visit: Payer: Self-pay | Admitting: Nurse Practitioner

## 2023-10-11 DIAGNOSIS — E113512 Type 2 diabetes mellitus with proliferative diabetic retinopathy with macular edema, left eye: Secondary | ICD-10-CM | POA: Diagnosis not present

## 2023-10-11 DIAGNOSIS — Z794 Long term (current) use of insulin: Secondary | ICD-10-CM

## 2023-10-18 ENCOUNTER — Telehealth: Payer: Self-pay | Admitting: Nurse Practitioner

## 2023-10-18 NOTE — Telephone Encounter (Signed)
Prescription Request  10/18/2023  LOV: 01/30/2023  What is the name of the medication or equipment? MOUNJARO 7.5 MG/0.5ML Pen [[id number]]   Have you contacted your pharmacy to request a refill? Yes   Which pharmacy would you like this sent to?  [facility] [address] [place], [place] - [address] [place] [address] Phone: [phone] Fax: [phone]    Patient notified that their request is being sent to the clinical staff for review and that they should receive a response within 2 business days.   Please advise at Mobile [phone] (mobile)

## 2023-10-19 NOTE — Telephone Encounter (Signed)
Spoke to patient this morning; she have a follow up appointment on 10/23/2023 @ 10:40 am with Claris Gower NP to address RX refills and medications.

## 2023-10-23 ENCOUNTER — Ambulatory Visit (INDEPENDENT_AMBULATORY_CARE_PROVIDER_SITE_OTHER): Payer: PPO | Admitting: Nurse Practitioner

## 2023-10-23 ENCOUNTER — Encounter: Payer: Self-pay | Admitting: Nurse Practitioner

## 2023-10-23 VITALS — BP 180/90 | HR 73 | Temp 97.9°F | Resp 18 | Ht 59.0 in | Wt 173.8 lb

## 2023-10-23 DIAGNOSIS — Z7985 Long-term (current) use of injectable non-insulin antidiabetic drugs: Secondary | ICD-10-CM

## 2023-10-23 DIAGNOSIS — G44209 Tension-type headache, unspecified, not intractable: Secondary | ICD-10-CM | POA: Diagnosis not present

## 2023-10-23 DIAGNOSIS — Z1211 Encounter for screening for malignant neoplasm of colon: Secondary | ICD-10-CM

## 2023-10-23 DIAGNOSIS — Z794 Long term (current) use of insulin: Secondary | ICD-10-CM

## 2023-10-23 DIAGNOSIS — I10 Essential (primary) hypertension: Secondary | ICD-10-CM

## 2023-10-23 DIAGNOSIS — M791 Myalgia, unspecified site: Secondary | ICD-10-CM

## 2023-10-23 DIAGNOSIS — D573 Sickle-cell trait: Secondary | ICD-10-CM | POA: Diagnosis not present

## 2023-10-23 DIAGNOSIS — E1169 Type 2 diabetes mellitus with other specified complication: Secondary | ICD-10-CM

## 2023-10-23 DIAGNOSIS — G8320 Monoplegia of upper limb affecting unspecified side: Secondary | ICD-10-CM

## 2023-10-23 DIAGNOSIS — Z1231 Encounter for screening mammogram for malignant neoplasm of breast: Secondary | ICD-10-CM

## 2023-10-23 DIAGNOSIS — E785 Hyperlipidemia, unspecified: Secondary | ICD-10-CM

## 2023-10-23 DIAGNOSIS — I69339 Monoplegia of upper limb following cerebral infarction affecting unspecified side: Secondary | ICD-10-CM | POA: Diagnosis not present

## 2023-10-23 LAB — CBC
HCT: 42.8 % (ref 36.0–46.0)
Hemoglobin: 13.8 g/dL (ref 12.0–15.0)
MCHC: 32.3 g/dL (ref 30.0–36.0)
MCV: 85 fL (ref 78.0–100.0)
Platelets: 358 10*3/uL (ref 150.0–400.0)
RBC: 5.03 Mil/uL (ref 3.87–5.11)
RDW: 14.2 % (ref 11.5–15.5)
WBC: 6.8 10*3/uL (ref 4.0–10.5)

## 2023-10-23 LAB — SEDIMENTATION RATE: Sed Rate: 9 mm/h (ref 0–30)

## 2023-10-23 LAB — COMPREHENSIVE METABOLIC PANEL
ALT: 25 U/L (ref 0–35)
AST: 27 U/L (ref 0–37)
Albumin: 4.3 g/dL (ref 3.5–5.2)
Alkaline Phosphatase: 92 U/L (ref 39–117)
BUN: 11 mg/dL (ref 6–23)
CO2: 33 meq/L — ABNORMAL HIGH (ref 19–32)
Calcium: 10.4 mg/dL (ref 8.4–10.5)
Chloride: 99 meq/L (ref 96–112)
Creatinine, Ser: 0.71 mg/dL (ref 0.40–1.20)
GFR: 95.68 mL/min (ref >60.00)
Glucose, Bld: 176 mg/dL — ABNORMAL HIGH (ref 70–99)
Potassium: 4.3 meq/L (ref 3.5–5.1)
Sodium: 138 meq/L (ref 135–145)
Total Bilirubin: 0.5 mg/dL (ref 0.2–1.2)
Total Protein: 7.7 g/dL (ref 6.0–8.3)

## 2023-10-23 LAB — IBC + FERRITIN
Ferritin: 42.1 ng/mL (ref 10.0–291.0)
Iron: 100 ug/dL (ref 42–145)
Saturation Ratios: 24.8 % (ref 20.0–50.0)
TIBC: 403.2 ug/dL (ref 250.0–450.0)
Transferrin: 288 mg/dL (ref 212.0–360.0)

## 2023-10-23 LAB — CK: Total CK: 60 U/L (ref 7–177)

## 2023-10-23 LAB — TSH: TSH: 0.49 u[IU]/mL (ref 0.35–5.50)

## 2023-10-23 LAB — LIPID PANEL
Cholesterol: 265 mg/dL — ABNORMAL HIGH (ref 0–200)
HDL: 51.8 mg/dL (ref >39.00)
LDL Cholesterol: 183 mg/dL — ABNORMAL HIGH (ref 0–99)
NonHDL: 213.34
Total CHOL/HDL Ratio: 5
Triglycerides: 151 mg/dL — ABNORMAL HIGH (ref 0.0–149.0)
VLDL: 30.2 mg/dL (ref 0.0–40.0)

## 2023-10-23 LAB — C-REACTIVE PROTEIN: CRP: <1 mg/dL (ref 0.5–20.0)

## 2023-10-23 MED ORDER — LOSARTAN POTASSIUM-HCTZ 100-12.5 MG PO TABS: 1.0000 | ORAL_TABLET | Freq: Every day | ORAL | 1 refills | Status: AC

## 2023-10-23 MED ORDER — INSULIN GLARGINE 100 UNIT/ML SOLOSTAR PEN: 15.0000 [IU] | PEN_INJECTOR | Freq: Every day | SUBCUTANEOUS | 1 refills | Status: DC

## 2023-10-23 MED ORDER — METOPROLOL SUCCINATE ER 25 MG PO TB24: 25.0000 mg | ORAL_TABLET | Freq: Every day | ORAL | 1 refills | Status: DC

## 2023-10-23 MED ORDER — KETOROLAC TROMETHAMINE 30 MG/ML IJ SOLN
30.0000 mg | Freq: Once | INTRAMUSCULAR | Status: AC
  Administered 2023-10-23: 30 mg via INTRAMUSCULAR

## 2023-10-23 MED ORDER — AMLODIPINE BESYLATE 10 MG PO TABS: 10.0000 mg | ORAL_TABLET | Freq: Every day | ORAL | 1 refills | Status: DC

## 2023-10-23 NOTE — Assessment & Plan Note (Signed)
Under the care of Dr. Tonita Cong lipid clinic Current use of crestor 10mg  once a week and repatha. She reports persistent myalgia and fatigue Check CK, THYROID, ANA, Rh factor, ESR, CRP, and CBC

## 2023-10-23 NOTE — Assessment & Plan Note (Signed)
No change 

## 2023-10-23 NOTE — Assessment & Plan Note (Signed)
Repeat cbc and iron panel

## 2023-10-23 NOTE — Assessment & Plan Note (Addendum)
Uncontrolled with last hgbA1c at 9% Does not check glucose at home eventhough she was provided a CGM device. Does not take lantus and mounjaro as prescribed. In the past: Unable to afford jardiance and Stop metformin due to constipation.  Advised about possible serious negative consequences of uncontrolled DIABETES. Advised to take meds as prescribed and check glucose daily. Repeat CMP, lipid panel, and UACr F/up in 1week

## 2023-10-23 NOTE — Progress Notes (Signed)
Established Patient Visit  Patient: [name]   DOB: 06-23-1966   57 y.o. Female  MRN: [id number] Visit Date: 10/23/2023  Subjective:    Chief Complaint  Patient presents with   office visit     PT is here for medication management; RX refills are needed. PT C/O of migraine    HPI Essential hypertension BP not at goal due to medication non compliance. Acute headache due to elevated BP. She take meds EOD because she does not like taking meds. BP Readings from Last 3 Encounters:  10/23/23 (!) 180/90  04/13/23 118/74  01/30/23 (!) 140/90    Advised about the serious consequences of uncontrolled HYPERTENSION, including CVA and MI. Advised to take med as prescribed Maintain amlodipine, metoprolol, and losartatn/hydrochlorothiazide dose Repeat CMP F/up in 1week   Hyperlipidemia associated with type 2 diabetes mellitus (HCC) Under the care of Dr. Tonita Cong lipid clinic Current use of crestor 10mg  once a week and repatha. 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 Does not take lantus and mounjaro as prescribed. In the past: Unable to afford jardiance and Stop metformin due to constipation.  Advised about possible serious negative consequences of uncontrolled DIABETES. Advised to take meds as prescribed and check glucose daily. Repeat CMP, lipid panel, and UACr F/up in 1week  Headache: light  sensitivity, no nausea, no change in vision. Does not take meds daily Home BP 160/72 No home glucose BP Readings from Last 3 Encounters:  10/23/23 (!) 180/90  04/13/23 118/74  01/30/23 (!) 140/90     Reviewed medical, surgical, and social history  today  Medications: Outpatient Medications Prior to Visit  Medication Sig   aspirin EC 81 MG tablet Take 1 tablet (81 mg total) by mouth daily.   Blood Glucose Monitoring Suppl (TRUE METRIX METER) w/Device KIT 1 each by Does not apply route as needed.   Continuous Glucose Sensor (FREESTYLE LIBRE 2 SENSOR) MISC INJECT 1 SENSOR INTO THE SKIN EVERY 14 DAYS FOR CONTINUOUS GLUCOSE MONITORING   Elastic Bandages & Supports (FINGER SPLINT) MISC 1 each by Does not apply route daily. On Left thumb and ring finger   Evolocumab (REPATHA SURECLICK) 140 MG/ML SOAJ Inject 140 mg into the skin every 14 (fourteen) days.   glucose blood (TRUE METRIX BLOOD GLUCOSE TEST) test strip 1 each by Other route 2 (two) times daily.   Insulin Pen Needle (PEN NEEDLES) 31G X 6 MM MISC 1 application  by Does not apply route at bedtime.   MOUNJARO 7.5 MG/0.5ML Pen INJECT 7.5 MG INTO THE SKIN ONCE A WEEK ** CHANGE IN DOSE**   rosuvastatin (CRESTOR) 10 MG tablet Take 1 tablet (10 mg total) by mouth once a week.   TRUEPLUS LANCETS 28G MISC 1 each by Does not apply route 2 (two) times daily.   [DISCONTINUED] amLODipine (NORVASC) 10 MG tablet Take 1 tablet (10 mg total) by mouth daily.   [DISCONTINUED] insulin glargine (LANTUS) 100 UNIT/ML Solostar Pen Inject 15 Units into the skin daily.   [DISCONTINUED] losartan-hydrochlorothiazide (HYZAAR) 100-12.5 MG tablet Take 1 tablet by mouth daily.   [DISCONTINUED] metoprolol succinate (TOPROL-XL) 25  MG 24 hr tablet Take 1 tablet (25 mg total) by mouth daily.   No facility-administered medications prior to visit.   Reviewed past medical and social history.   ROS per HPI above      Objective:  BP (!) 180/90   Pulse 73   Temp 97.9 F (36.6 C) (Temporal)   Resp 18   Ht 4\' 11"  (1.499 m)   Wt 173 lb 12.8 oz (78.8 kg)   LMP 05/19/2017   SpO2 100%   BMI 35.10 kg/m      Physical Exam Vitals and nursing note reviewed.  Cardiovascular:     Rate and Rhythm: Normal rate and  regular rhythm.     Pulses: Normal pulses.     Heart sounds: Normal heart sounds.  Pulmonary:     Effort: Pulmonary effort is normal.     Breath sounds: Normal breath sounds.  Musculoskeletal:     Right lower leg: No edema.     Left lower leg: No edema.  Neurological:     Mental Status: She is alert and oriented to person, place, and time.     Cranial Nerves: No cranial nerve deficit.     No results found for any visits on 10/23/23.    Assessment & Plan:    Problem List Items Addressed This Visit     DM (diabetes mellitus) (HCC)    Uncontrolled with last hgbA1c at 9% Does not check glucose at home eventhough she was provided a CGM device. Does not take lantus and mounjaro as prescribed. In the past: Unable to afford jardiance and Stop metformin due to constipation.  Advised about possible serious negative consequences of uncontrolled DIABETES. Advised to take meds as prescribed and check glucose daily. Repeat CMP, lipid panel, and UACr F/up in 1week      Relevant Medications   insulin glargine (LANTUS) 100 UNIT/ML Solostar Pen   losartan-hydrochlorothiazide (HYZAAR) 100-12.5 MG tablet   Other Relevant Orders   Comprehensive metabolic panel   Hemoglobin A1c   Microalbumin / creatinine urine ratio   Essential hypertension    BP not at goal due to medication non compliance. Acute headache due to elevated BP. She take meds EOD because she does not like taking meds. BP Readings from Last 3 Encounters:  10/23/23 (!) 180/90  04/13/23 118/74  01/30/23 (!) 140/90    Advised about the serious consequences of uncontrolled HYPERTENSION, including CVA and MI. Advised to take med as prescribed Maintain amlodipine, metoprolol, and losartatn/hydrochlorothiazide dose Repeat CMP F/up in 1week       Relevant Medications   amLODipine (NORVASC) 10 MG tablet   losartan-hydrochlorothiazide (HYZAAR) 100-12.5 MG tablet   metoprolol succinate (TOPROL-XL) 25 MG 24 hr tablet    Hyperlipidemia associated with type 2 diabetes mellitus (HCC) - Primary    Under the care of Dr. Tonita Cong lipid clinic Current use of crestor 10mg  once a week and repatha. She reports persistent myalgia and fatigue Check CK, THYROID, ANA, Rh factor, ESR, CRP, and CBC      Relevant Medications   amLODipine (NORVASC) 10 MG tablet   insulin glargine (LANTUS) 100 UNIT/ML Solostar Pen   losartan-hydrochlorothiazide (HYZAAR) 100-12.5 MG tablet   metoprolol succinate (TOPROL-XL) 25 MG 24 hr tablet   Other Relevant Orders   Comprehensive metabolic panel   Lipid panel   Sickle cell trait (HCC)    Repeat cbc and iron panel      Relevant Orders   IBC + Ferritin   Spastic monoplegia  of upper extremity (HCC)    No change      Other Visit Diagnoses     Acute non intractable tension-type headache       Relevant Medications   ketorolac (TORADOL) 30 MG/ML injection 30 mg (Completed)   amLODipine (NORVASC) 10 MG tablet   metoprolol succinate (TOPROL-XL) 25 MG 24 hr tablet   Colon cancer screening       Relevant Orders   Cologuard   Breast cancer screening by mammogram       Relevant Orders   MM 3D SCREENING MAMMOGRAM BILATERAL BREAST   Myalgia       Relevant Orders   CK   TSH   Sedimentation rate   C-reactive protein   ANA w/Reflex   Rheumatoid Factor   CBC   IBC + Ferritin   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     Alysia Penna, NP

## 2023-10-23 NOTE — Assessment & Plan Note (Addendum)
BP not at goal due to medication non compliance. Acute headache due to elevated BP. Onset of headache today. Associated with light sensitivity. She did not woke up with headache, no nausea, no confusion, no change in vision. No hx of migraine headache or head injury. She take meds EOD because she does not like taking meds. BP Readings from Last 3 Encounters:  10/23/23 (!) 180/90  04/13/23 118/74  01/30/23 (!) 140/90    Advised about the serious negative consequences of uncontrolled HYPERTENSION, including another CVA. Advised to take med as prescribed. Administered toradol for acute headache. Maintain amlodipine, metoprolol, and losartatn/hydrochlorothiazide dose Repeat CMP F/up in 1week

## 2023-10-23 NOTE — Patient Instructions (Addendum)
Schedule appointment with GYN for repeat PAP. Schedule appointment with [facility] mammography. Resume meds as prescribed Go to lab [facility] to use tylenol 500-650mg  every 8hrs as needed for pain. Go to ED if headcahe does not improve or you develop new symptoms  Stroke Prevention Some medical conditions and behaviors can lead to a higher chance of having a stroke. You can help prevent a stroke by eating healthy, exercising, not smoking, and managing any medical conditions you have. Stroke is a leading cause of functional impairment. Primary prevention is particularly important because a majority of strokes are first-time events. Stroke changes the lives of not only those who experience a stroke but also their family and other caregivers. How can this condition affect me? A stroke is a medical emergency and should be treated right away. A stroke can lead to brain damage and can sometimes be life-threatening. If a person gets medical treatment right away, there is a better chance of surviving and recovering from a stroke. What can increase my risk? The following medical conditions may increase your risk of a stroke: Cardiovascular disease. High blood pressure (hypertension). Diabetes. High cholesterol. Sickle cell disease. Blood clotting disorders (hypercoagulable state). Obesity. Sleep disorders (obstructive sleep apnea). Other risk factors include: Being older than age 39. Having a history of blood clots, stroke, or mini-stroke (transient ischemic attack, TIA). Genetic factors, such as race, ethnicity, or a family history of stroke. Smoking cigarettes or using other tobacco products. Taking birth control pills, especially if you also use tobacco. Heavy use of alcohol or drugs, especially cocaine and methamphetamine. Physical inactivity. What actions can I take to prevent this? Manage your health conditions High cholesterol levels. Eating a healthy diet is important for preventing high  cholesterol. If cholesterol cannot be managed through diet alone, you may need to take medicines. Take any prescribed medicines to control your cholesterol as told by your health care provider. Hypertension. To reduce your risk of stroke, try to keep your blood pressure below 130/80. Eating a healthy diet and exercising regularly are important for controlling blood pressure. If these steps are not enough to manage your blood pressure, you may need to take medicines. Take any prescribed medicines to control hypertension as told by your health care provider. Ask your health care provider if you should monitor your blood pressure at home. Have your blood pressure checked every year, even if your blood pressure is normal. Blood pressure increases with age and some medical conditions. Diabetes. Eating a healthy diet and exercising regularly are important parts of managing your blood sugar (glucose). If your blood sugar cannot be managed through diet and exercise, you may need to take medicines. Take any prescribed medicines to control your diabetes as told by your health care provider. Get evaluated for obstructive sleep apnea. Talk to your health care provider about getting a sleep evaluation if you snore a lot or have excessive sleepiness. Make sure that any other medical conditions you have, such as atrial fibrillation or atherosclerosis, are managed. Nutrition Follow instructions from your health care provider about what to eat or drink to help manage your health condition. These instructions may include: Reducing your daily calorie intake. Limiting how much salt (sodium) you use to 1,500 milligrams (mg) each day. Using only healthy fats for cooking, such as olive oil, canola oil, or sunflower oil. Eating healthy foods. You can do this by: Choosing foods that are high in fiber, such as whole grains, and fresh fruits and vegetables. Eating at least 5  servings of fruits and vegetables a day. Try to  fill one-half of your plate with fruits and vegetables at each meal. Choosing lean protein foods, such as lean cuts of meat, poultry without skin, fish, tofu, beans, and nuts. Eating low-fat dairy products. Avoiding foods that are high in sodium. This can help lower blood pressure. Avoiding foods that have saturated fat, trans fat, and cholesterol. This can help prevent high cholesterol. Avoiding processed and prepared foods. Counting your daily carbohydrate intake.  Lifestyle If you drink alcohol: Limit how much you have to: 0-1 drink a day for women who are not pregnant. 0-2 drinks a day for men. Know how much alcohol is in your drink. In the [place] one drink equals one 12 oz bottle of beer ( ), one 5 oz glass of wine ( ), or one 1 oz glass of hard liquor (44mL). Do not use any products that contain nicotine or tobacco. These products include cigarettes, chewing tobacco, and vaping devices, such as e-cigarettes. If you need help quitting, ask your health care provider. Avoid secondhand smoke. Do not use drugs. Activity  Try to stay at a healthy weight. Get at least 30 minutes of exercise on most days, such as: Fast walking. Biking. Swimming. Medicines Take over-the-counter and prescription medicines only as told by your health care provider. Aspirin or blood thinners (antiplatelets or anticoagulants) may be recommended to reduce your risk of forming blood clots that can lead to stroke. Avoid taking birth control pills. Talk to your health care provider about the risks of taking birth control pills if: You are over 39 years old. You smoke. You get very bad headaches. You have had a blood clot. Where to find more information American Stroke Association: www.strokeassociation.org Get help right away if: You or a loved one has any symptoms of a stroke. "BE FAST" is an easy way to remember the main warning signs of a stroke: B - Balance. Signs are dizziness, sudden trouble  walking, or loss of balance. E - Eyes. Signs are trouble seeing or a sudden change in vision. F - Face. Signs are sudden weakness or numbness of the face, or the face or eyelid drooping on one side. A - Arms. Signs are weakness or numbness in an arm. This happens suddenly and usually on one side of the body. S - Speech. Signs are sudden trouble speaking, slurred speech, or trouble understanding what people say. T - Time. Time to call emergency services. Write down what time symptoms started. You or a loved one has other signs of a stroke, such as: A sudden, severe headache with no known cause. Nausea or vomiting. Seizure. These symptoms may represent a serious problem that is an emergency. Do not wait to see if the symptoms will go away. Get medical help right away. Call your local emergency services (911 in the [place] Do not drive yourself to the hospital. Summary You can help to prevent a stroke by eating healthy, exercising, not smoking, limiting alcohol intake, and managing any medical conditions you may have. Do not use any products that contain nicotine or tobacco. These include cigarettes, chewing tobacco, and vaping devices, such as e-cigarettes. If you need help quitting, ask your health care provider. Remember "BE FAST" for warning signs of a stroke. Get help right away if you or a loved one has any of these signs. This information is not intended to replace advice given to you by your health care provider. Make sure you discuss any questions you have  with your health care provider. Document Revised: 11/07/2022 Document Reviewed: 11/07/2022 Elsevier Patient Education  2024 [employer].

## 2023-10-24 ENCOUNTER — Other Ambulatory Visit: Payer: PPO

## 2023-10-24 DIAGNOSIS — E1169 Type 2 diabetes mellitus with other specified complication: Secondary | ICD-10-CM | POA: Diagnosis not present

## 2023-10-24 DIAGNOSIS — Z794 Long term (current) use of insulin: Secondary | ICD-10-CM | POA: Diagnosis not present

## 2023-10-24 LAB — MICROALBUMIN / CREATININE URINE RATIO
Creatinine,U: 24 mg/dL
Microalb Creat Ratio: 5.3 mg/g (ref 0.0–30.0)
Microalb, Ur: 1.3 mg/dL (ref 0.0–1.9)

## 2023-10-24 LAB — RHEUMATOID FACTOR: Rheumatoid fact SerPl-aCnc: 12 [IU]/mL (ref <14)

## 2023-10-24 LAB — ANA W/REFLEX: Anti Nuclear Antibody (ANA): NEGATIVE

## 2023-10-25 LAB — HEMOGLOBIN A1C
Hgb A1c MFr Bld: 10.3 %{Hb} — ABNORMAL HIGH (ref <5.7)
Mean Plasma Glucose: 249 mg/dL
eAG (mmol/L): 13.8 mmol/L

## 2023-10-27 MED ORDER — MOUNJARO 7.5 MG/0.5ML ~~LOC~~ SOAJ: 7.5000 mg | SUBCUTANEOUS | 2 refills | Status: DC

## 2023-10-27 NOTE — Addendum Note (Signed)
Addended by: Alysia Penna L on: 10/27/2023 04:06 PM   Modules accepted: Orders

## 2023-11-01 ENCOUNTER — Ambulatory Visit (INDEPENDENT_AMBULATORY_CARE_PROVIDER_SITE_OTHER): Payer: PPO | Admitting: Nurse Practitioner

## 2023-11-01 ENCOUNTER — Encounter: Payer: Self-pay | Admitting: Nurse Practitioner

## 2023-11-01 VITALS — BP 135/68 | HR 86 | Temp 97.9°F | Resp 18 | Wt 173.8 lb

## 2023-11-01 DIAGNOSIS — I10 Essential (primary) hypertension: Secondary | ICD-10-CM

## 2023-11-01 DIAGNOSIS — Z794 Long term (current) use of insulin: Secondary | ICD-10-CM

## 2023-11-01 DIAGNOSIS — E785 Hyperlipidemia, unspecified: Secondary | ICD-10-CM

## 2023-11-01 DIAGNOSIS — E1169 Type 2 diabetes mellitus with other specified complication: Secondary | ICD-10-CM | POA: Diagnosis not present

## 2023-11-01 DIAGNOSIS — E1165 Type 2 diabetes mellitus with hyperglycemia: Secondary | ICD-10-CM | POA: Diagnosis not present

## 2023-11-01 NOTE — Progress Notes (Signed)
Established Patient Visit  Patient: [name]   DOB: [birth year]   57 y.o. Female  MRN: [id number] Visit Date: 11/01/2023  Subjective:    Chief Complaint  Patient presents with   OFFICE VISIT    Hypertension    1 week follow up. PT is due for cervical screening, shingles vaccine and cologuard   HPI Essential hypertension Improved BP and at goal with med compliance Seh denies any adverse effects BP Readings from Last 3 Encounters:  11/01/23 135/68  10/23/23 (!) 180/90  04/13/23 118/74    Maintain med doses F/up in 3months  Hyperlipidemia associated with type 2 diabetes mellitus (HCC) Unable to tolerate atorvastatin, crestor and lovastatin. No improvement of LDL with zetia Advised to schedule f/up appointment with advanced lipid clinic as previously instructed. Number provided  DM (diabetes mellitus) (HCC) Fasting Glucose reading on CGM in last 7days: Fasting Home glucose: 120-202. Reports she is compliance with lantus and mounjaro dose. Maintain med doses Advised to schedule Dm eye exam (due 10/2023) F/up in 3months   Reviewed medical, surgical, and social history today  Medications: Outpatient Medications Prior to Visit  Medication Sig   amLODipine (NORVASC) 10 MG tablet Take 1 tablet (10 mg total) by mouth daily.   aspirin EC 81 MG tablet Take 1 tablet (81 mg total) by mouth daily.   Blood Glucose Monitoring Suppl (TRUE METRIX METER) w/Device KIT 1 each by Does not apply route as needed.   Continuous Glucose Sensor (FREESTYLE LIBRE 2 SENSOR) MISC INJECT 1 SENSOR INTO THE SKIN EVERY 14 DAYS FOR CONTINUOUS GLUCOSE MONITORING   Elastic Bandages & Supports (FINGER SPLINT) MISC 1 each by Does not apply route daily. On Left thumb and ring finger   Evolocumab (REPATHA SURECLICK) 140 MG/ML SOAJ Inject 140 mg into the skin every 14 (fourteen) days.   glucose blood (TRUE METRIX BLOOD GLUCOSE TEST) test strip 1 each by Other route 2 (two) times daily.    insulin glargine (LANTUS) 100 UNIT/ML Solostar Pen Inject 15 Units into the skin daily.   Insulin Pen Needle (PEN NEEDLES) 31G X 6 MM MISC 1 application  by Does not apply route at bedtime.   losartan-hydrochlorothiazide (HYZAAR) 100-12.5 MG tablet Take 1 tablet by mouth daily.   metoprolol succinate (TOPROL-XL) 25 MG 24 hr tablet Take 1 tablet (25 mg total) by mouth daily.   tirzepatide (MOUNJARO) 7.5 MG/0.5ML Pen Inject 7.5 mg into the skin once a week.   TRUEPLUS LANCETS 28G MISC 1 each by Does not apply route 2 (two) times daily.   [DISCONTINUED] rosuvastatin (CRESTOR) 10 MG tablet Take 1 tablet (10 mg total) by mouth once a week.   No facility-administered medications prior to visit.   Reviewed past medical and social history.   ROS per HPI above  Last metabolic panel Lab Results  Component Value Date   GLUCOSE 176 (H) 10/23/2023   NA 138 10/23/2023   K 4.3 10/23/2023   CL 99 10/23/2023   CO2 33 (H) 10/23/2023   BUN 11 10/23/2023   CREATININE 0.71 10/23/2023   GFR 95.68 10/23/2023   CALCIUM 10.4 10/23/2023   PROT 7.7 10/23/2023   ALBUMIN 4.3 10/23/2023   BILITOT 0.5 10/23/2023   ALKPHOS 92 10/23/2023   AST 27 10/23/2023   ALT 25 10/23/2023   ANIONGAP 6 10/27/2021   Last lipids Lab Results  Component Value Date   CHOL 265 (H) 10/23/2023  HDL 51.80 10/23/2023   LDLCALC 183 (H) 10/23/2023   TRIG 151.0 (H) 10/23/2023   CHOLHDL 5 10/23/2023   Last hemoglobin A1c Lab Results  Component Value Date   HGBA1C 10.3 (H) 10/24/2023        Objective:  BP 135/68 (BP Location: Right Arm, Patient Position: Sitting, Cuff Size: Large) Comment: recheck BP reading  Pulse 86   Temp 97.9 F (36.6 C) (Temporal)   Resp 18   Wt 173 lb 12.8 oz (78.8 kg)   LMP 05/19/2017   SpO2 97%   BMI 35.10 kg/m      Physical Exam Vitals and nursing note reviewed.  Cardiovascular:     Rate and Rhythm: Normal rate and regular rhythm.     Pulses: Normal pulses.     Heart sounds: Normal  heart sounds.  Pulmonary:     Effort: Pulmonary effort is normal.     Breath sounds: Normal breath sounds.  Neurological:     Mental Status: She is alert and oriented to person, place, and time.     No results found for any visits on 11/01/23.    Assessment & Plan:    Problem List Items Addressed This Visit     DM (diabetes mellitus) (HCC)    Fasting Glucose reading on CGM in last 7days: Fasting Home glucose: 120-202. Reports she is compliance with lantus and mounjaro dose. Maintain med doses Advised to schedule Dm eye exam (due 10/2023) F/up in 3months      Essential hypertension - Primary    Improved BP and at goal with med compliance Seh denies any adverse effects BP Readings from Last 3 Encounters:  11/01/23 135/68  10/23/23 (!) 180/90  04/13/23 118/74    Maintain med doses F/up in 3months      Hyperlipidemia associated with type 2 diabetes mellitus (HCC)    Unable to tolerate atorvastatin, crestor and lovastatin. No improvement of LDL with zetia Advised to schedule f/up appointment with advanced lipid clinic as previously instructed. Number provided     Also advised to complete cologuard kit,  appointment with psychologist and GYN (for PAP and mammogram)  Return in about 3 months (around 02/01/2024) for HTN, DM, hyperlipidemia (fasting).     Alysia Penna, NP

## 2023-11-01 NOTE — Assessment & Plan Note (Signed)
Improved BP and at goal with med compliance Seh denies any adverse effects BP Readings from Last 3 Encounters:  11/01/23 135/68  10/23/23 (!) 180/90  04/13/23 118/74    Maintain med doses F/up in 3months

## 2023-11-01 NOTE — Patient Instructions (Addendum)
Schedule appointment with advanced lipid clinic: Dr. Rennis Golden [phone] Schedule appointment with therapist: [email or web address] or [email or web address] Schedule appointment with GYN Dr. Cherly Hensen for PAP and mammogram Maintain current med doses

## 2023-11-01 NOTE — Assessment & Plan Note (Addendum)
Unable to tolerate atorvastatin, crestor and lovastatin. No improvement of LDL with zetia Advised to schedule f/up appointment with advanced lipid clinic as previously instructed. Number provided

## 2023-11-01 NOTE — Assessment & Plan Note (Addendum)
Fasting Glucose reading on CGM in last 7days: Fasting Home glucose: 120-202. Reports she is compliance with lantus and mounjaro dose. Maintain med doses Advised to schedule Dm eye exam (due 10/2023) F/up in 3months

## 2023-11-07 ENCOUNTER — Encounter (HOSPITAL_BASED_OUTPATIENT_CLINIC_OR_DEPARTMENT_OTHER): Payer: PPO | Admitting: Internal Medicine

## 2023-11-09 LAB — COLOGUARD

## 2023-11-10 ENCOUNTER — Telehealth: Payer: Self-pay | Admitting: Nurse Practitioner

## 2023-11-10 NOTE — Telephone Encounter (Signed)
Please give the patient a call Pt need a note saying she can go back to school saying she can finish her courses

## 2023-11-10 NOTE — Telephone Encounter (Signed)
Spoke to patient in regards for note request to return to school. I did advised patient that [name] was unwilling to write note due to issue not being address in previous office visit and was not put out from school by [place] NP. PT verbalized understanding.

## 2023-11-14 ENCOUNTER — Other Ambulatory Visit: Payer: Self-pay | Admitting: Nurse Practitioner

## 2023-11-14 DIAGNOSIS — E1169 Type 2 diabetes mellitus with other specified complication: Secondary | ICD-10-CM

## 2023-11-27 DIAGNOSIS — E113511 Type 2 diabetes mellitus with proliferative diabetic retinopathy with macular edema, right eye: Secondary | ICD-10-CM | POA: Diagnosis not present

## 2023-11-28 ENCOUNTER — Other Ambulatory Visit: Payer: Self-pay | Admitting: Nurse Practitioner

## 2023-11-28 DIAGNOSIS — Z1211 Encounter for screening for malignant neoplasm of colon: Secondary | ICD-10-CM

## 2023-11-28 LAB — COLOGUARD

## 2023-12-01 DIAGNOSIS — E11319 Type 2 diabetes mellitus with unspecified diabetic retinopathy without macular edema: Secondary | ICD-10-CM | POA: Diagnosis not present

## 2023-12-01 DIAGNOSIS — E113413 Type 2 diabetes mellitus with severe nonproliferative diabetic retinopathy with macular edema, bilateral: Secondary | ICD-10-CM | POA: Diagnosis not present

## 2023-12-01 DIAGNOSIS — E113512 Type 2 diabetes mellitus with proliferative diabetic retinopathy with macular edema, left eye: Secondary | ICD-10-CM | POA: Diagnosis not present

## 2024-01-03 ENCOUNTER — Telehealth: Payer: Self-pay

## 2024-01-03 NOTE — Progress Notes (Signed)
 Care Guide Pharmacy Note  01/03/2024 Name: [name] MRN: [id number] DOB: [birth year]  Referred By: Kandace Organ, NP Reason for referral: Care Coordination ([facility] Diabetes. )   [name] is a 58 y.o. year old female who is a primary care patient of Nche, Connye Delaine, NP.  Gaston Karvonen was referred to the pharmacist for assistance related to: DMII  Successful contact was made with the patient to discuss pharmacy services including being ready for the pharmacist to call at least 5 minutes before the scheduled appointment time and to have medication bottles and any blood pressure readings ready for review. The patient agreed to meet with the pharmacist via telephone visit on (date/time). 01/08/24 at 3:00 p.m.  [facility]  [facility], [facility] Management Assistant Direct Dial: [phone]  Fax: [phone]

## 2024-01-05 ENCOUNTER — Other Ambulatory Visit: Payer: Self-pay | Admitting: Nurse Practitioner

## 2024-01-05 DIAGNOSIS — E1169 Type 2 diabetes mellitus with other specified complication: Secondary | ICD-10-CM

## 2024-01-08 ENCOUNTER — Other Ambulatory Visit: Payer: Self-pay

## 2024-01-08 NOTE — Progress Notes (Addendum)
   01/08/2024  Patient ID: [name], female   DOB: 1966/01/03, 58 y.o.   MRN: [id number]  Outreach attempt for scheduled telephone visit to discuss medications and control of diabetes was unsuccessful.  Attempted to call patient 3 times, but only number listed on profile is currently not working.  I will attempt to contact the patient again next week.  Lenna Gilford, PharmD, DPLA

## 2024-01-15 ENCOUNTER — Other Ambulatory Visit: Payer: Self-pay

## 2024-01-16 ENCOUNTER — Telehealth: Payer: Self-pay

## 2024-01-16 NOTE — Telephone Encounter (Signed)
LVM for pt to call the office or schedule an OV on MyChart for 14-month f/u on DM.

## 2024-01-17 ENCOUNTER — Other Ambulatory Visit: Payer: Self-pay

## 2024-01-17 NOTE — Progress Notes (Signed)
   01/17/2024  Patient ID: [name], female   DOB: 11/22/1966, 58 y.o.   MRN: [id number]  Patient out reach to reschedule missed telephone visit from last week.  Telephone visit to review medications and discuss control of diabetes has been scheduled for Tuesday, February 4 at 9 AM.  Lenna Gilford, PharmD, DPLA

## 2024-01-23 ENCOUNTER — Other Ambulatory Visit: Payer: Self-pay

## 2024-01-23 NOTE — Progress Notes (Signed)
 Spoke with pt and scheduled appt

## 2024-01-23 NOTE — Progress Notes (Signed)
 01/23/2024 Name: [name] MRN: [id number] DOB: 04-22-1966  Chief Complaint  Patient presents with   Medication Management   [name] is a 58 y.o. year old female who presented for a telephone visit.   They were referred to the pharmacist by a quality report for assistance in managing diabetes.   Subjective:  Care Team: Primary Care Provider: Katheen Roselie Rockford, NP ; Next Scheduled Visit: n/a Cardiologist: Dr. Mona; Next Scheduled Visit: 03/12/24  Medication Access/Adherence  Current Pharmacy:  [facility] [address] [place] - [address] [place] [address] Phone: [phone] Fax: [phone]  -Patient reports affordability concerns with their medications: No  -Patient reports access/transportation concerns to their pharmacy: No  -Patient reports adherence concerns with their medications:  Yes    Diabetes: Current medications: Lantus  10 units daily, Mounjaro  7.5 mg weekly -Current glucose readings: Patient endorses FBG values averaging 105 -Using libre 2 CGM, but patient is not currently linked to [employer] -Patient denies hypoglycemic s/sx including dizziness, shakiness, sweating.  -Patient endorses forgetting Lantus  doses at times, which she believes may be why A1c was elevated at 10.3 last November -Patient states she has been on Mounjaro  7.5 mg weekly approximately 3 months, and this medication is affordable on her insurance with a $25 co-pay.  Patient also endorses tolerating medication well with no adverse side effects  Hypertension: Current medications: Amlodipine  10 mg daily, losartan /hydrochlorothiazide  100/12.5 mg daily, metoprolol  XL 25 mg daily -Patient has a validated, automated, upper arm home BP cuff, but she does not provide any recent readings -Last office visit blood pressure was 135/68  Hyperlipidemia/ASCVD Risk Reduction Current lipid lowering medications:  None -Patient was prescribed Repatha  but states this was not affordable, so she never started the medication Antiplatelet regimen: ASA 81 mg tablet daily  Objective: Lab Results  Component Value Date   HGBA1C 10.3 (H) 10/24/2023   Lab Results  Component Value Date   CREATININE 0.71 10/23/2023   BUN 11 10/23/2023   NA 138 10/23/2023   K 4.3 10/23/2023   CL 99 10/23/2023   CO2 33 (H) 10/23/2023   Lab Results  Component Value Date   CHOL 265 (H) 10/23/2023   HDL 51.80 10/23/2023   LDLCALC 183 (H) 10/23/2023   TRIG 151.0 (H) 10/23/2023   CHOLHDL 5 10/23/2023   Medications Reviewed Today     Reviewed by Deanna Channing LABOR, RPH (Pharmacist) on 01/23/24 at 0914  Med List Status: <None>   Medication Order Taking? Sig Documenting Provider Last Dose Status Informant  amLODipine  (NORVASC ) 10 MG tablet [id number] Yes Take 1 tablet (10 mg total) by mouth daily. Katheen Roselie Rockford, NP Taking Active   aspirin  EC 81 MG tablet [id number] Yes Take 1 tablet (81 mg total) by mouth daily. Vicci Barnie NOVAK, MD Taking Active Self  Blood Glucose Monitoring Suppl (TRUE METRIX METER) w/Device KIT [id number] No 1 each by Does not apply route as needed.  Patient not taking: Reported on 01/23/2024   Funches, Josalyn, MD Not Taking Active Self  Continuous Glucose Sensor (FREESTYLE LIBRE 2 SENSOR) MISC [id number] Yes INJECT 1 SENSOR INTO THE SKIN EVERY 14 DAYS FOR CONTINUOUS GLUCOSE MONITORING Nche, Roselie Rockford, NP Taking Active   Evolocumab  (REPATHA  SURECLICK) 140 MG/ML SOAJ [id number] No Inject 140 mg into the skin every 14 (fourteen) days.  Patient not taking: Reported on 01/23/2024   Mona Vinie BROCKS, MD Not Taking Active   glucose blood (  TRUE METRIX BLOOD GLUCOSE TEST) test strip [id number] No 1 each by Other route 2 (two) times daily.  Patient not taking: Reported on 01/23/2024   Funches, Josalyn, MD Not Taking Active Self  insulin  glargine (LANTUS ) 100 UNIT/ML Solostar Pen [id number] Yes Inject 15 Units  into the skin daily. Nche, Roselie Rockford, NP Taking Active   Insulin  Pen Needle (PEN NEEDLES) 31G X 6 MM MISC [id number] Yes 1 application  by Does not apply route at bedtime. Nche, Roselie Rockford, NP Taking Active   losartan -hydrochlorothiazide  (HYZAAR) 100-12.5 MG tablet [id number] Yes Take 1 tablet by mouth daily. Nche, Roselie Rockford, NP Taking Active   metoprolol  succinate (TOPROL -XL) 25 MG 24 hr tablet [id number] Yes Take 1 tablet (25 mg total) by mouth daily. Katheen Roselie Rockford, NP Taking Active   tirzepatide  (MOUNJARO ) 7.5 MG/0.5ML Pen [id number] Yes Inject 7.5 mg into the skin once a week. Katheen Roselie Rockford, NP Taking Active   TRUEPLUS LANCETS 28G MISC [id number] No 1 each by Does not apply route 2 (two) times daily.  Patient not taking: Reported on 01/23/2024   Funches, Josalyn, MD Not Taking Active Self           Assessment/Plan:   Diabetes: -Currently uncontrolled -Sent patient an invite to share CGM data with [place] grand over via [employer] -Reviewed goal A1c, goal fasting, and goal 2 hour post prandial glucose -I recommend increasing Mounjaro  to 10 mg weekly and decreasing the Lantus  to 12 units daily-goal to further increase Mounjaro  as tolerated/needed to be able to completely stop Lantus  -Will collaborate with PCP to see if she is in agreement; if so, I recommend a follow-up visit with A1c in another 3 months  Hypertension: -Currently moderately controlled -Recommended to check home blood pressure and heart rate daily -If BP consistently >130/80, I recommend increasing HCTZ component of losartan /HCTZ to 25 mg as long as CMP is within normal limits  Hyperlipidemia/ASCVD Risk Reduction: -Currently uncontrolled.  -Patient sees Dr. Mona with cardiology in March to follow-up on hyperlipidemia  Follow Up Plan: Will contact patient once I hear back from PCP to coordinate diabetes management plan  Channing DELENA Mealing, PharmD, DPLA

## 2024-01-24 ENCOUNTER — Telehealth: Payer: Self-pay

## 2024-01-24 NOTE — Telephone Encounter (Deleted)
 error

## 2024-01-24 NOTE — Progress Notes (Signed)
Pt declined 2/10, rescheduled 02/09/24.

## 2024-01-24 NOTE — Telephone Encounter (Signed)
--   Message from [place] sent at 01/24/2024 12:39 PM EST -----  Regarding: FW: f/up appt Spoke with pt, she declined 2/10, she'll be out of town. She is rescheduled for 02/09/24. ----- Message ----- From: Celinda Roxie RAMAN Sent: 01/24/2024  12:21 PM EST To: Roselie Bishop Mood, NP; [facility] Admin Subject: RE: f/up appt                                  Admin - please call pt to move appt up. She is currently scheduled in March. ----- Message ----- From: Mood Roselie Bishop, NP Sent: 01/23/2024   3:04 PM EST To: Gina C Ghalyoun; [place]; # Subject: f/up appt                                      I will like to see her in office to review glucose readings and repeat hgbA1c, prior to implementing the recommended med changes.  CN ----- Message ----- From: Deanna Channing LABOR, [facility] Sent: 01/23/2024   9:44 AM EST To: Roselie Bishop Mood, NP  Good morning,  I had a telephone visit with Ms. Behring this morning to review her medications and discuss control of diabetes.  I do believe that we are missing doses of Lantus  some days, which may have led to her elevated A1c last November.  See my note for details, but I recommend increasing Mounjaro  to 10 mg weekly and decreasing Lantus  to 12 units daily with a goal of increasing Mounjaro  as tolerated/needed to be able to completely stop Lantus  in the future.  Would you agree with this?  If so, I recommend a follow-up visit with A1c reading in 3 months; and I can continue to check in with the patient on a regular basis.  Just let me know your thoughts-thank you!  Channing LABOR Deanna, PharmD, DPLA

## 2024-01-29 DIAGNOSIS — E113511 Type 2 diabetes mellitus with proliferative diabetic retinopathy with macular edema, right eye: Secondary | ICD-10-CM | POA: Diagnosis not present

## 2024-02-05 DIAGNOSIS — E113512 Type 2 diabetes mellitus with proliferative diabetic retinopathy with macular edema, left eye: Secondary | ICD-10-CM | POA: Diagnosis not present

## 2024-02-06 DIAGNOSIS — R92323 Mammographic fibroglandular density, bilateral breasts: Secondary | ICD-10-CM | POA: Diagnosis not present

## 2024-02-06 DIAGNOSIS — Z1231 Encounter for screening mammogram for malignant neoplasm of breast: Secondary | ICD-10-CM | POA: Diagnosis not present

## 2024-02-09 ENCOUNTER — Ambulatory Visit: Payer: PPO | Admitting: Nurse Practitioner

## 2024-02-09 ENCOUNTER — Other Ambulatory Visit: Payer: Self-pay | Admitting: Nurse Practitioner

## 2024-02-09 DIAGNOSIS — E1169 Type 2 diabetes mellitus with other specified complication: Secondary | ICD-10-CM

## 2024-02-20 ENCOUNTER — Telehealth: Payer: Self-pay

## 2024-02-20 NOTE — Telephone Encounter (Signed)
 Patient was identified as falling into the [place] Measure - Diabetes.   Patient was: Left voicemail to schedule with primary care provider.  Alysia Penna NP.   I was unable to leave VM.

## 2024-02-21 ENCOUNTER — Ambulatory Visit: Payer: PPO | Admitting: Nurse Practitioner

## 2024-02-23 ENCOUNTER — Telehealth: Payer: Self-pay

## 2024-02-23 ENCOUNTER — Telehealth: Payer: Self-pay | Admitting: Nurse Practitioner

## 2024-02-23 ENCOUNTER — Encounter: Payer: Self-pay | Admitting: Nurse Practitioner

## 2024-02-23 NOTE — Telephone Encounter (Signed)
 Called Pt to schedule mammogram and cervical screening. Pt is due for CPE. I was unable to leave VM.

## 2024-02-23 NOTE — Telephone Encounter (Signed)
 02/09/2024 1st no show, letter sent via [facility]

## 2024-02-26 ENCOUNTER — Other Ambulatory Visit: Payer: Self-pay | Admitting: Nurse Practitioner

## 2024-02-26 DIAGNOSIS — E1169 Type 2 diabetes mellitus with other specified complication: Secondary | ICD-10-CM

## 2024-03-01 ENCOUNTER — Other Ambulatory Visit (HOSPITAL_COMMUNITY): Payer: Self-pay

## 2024-03-01 ENCOUNTER — Telehealth: Payer: Self-pay | Admitting: Pharmacy Technician

## 2024-03-01 NOTE — Telephone Encounter (Signed)
 Pharmacy Patient Advocate Encounter  Received notification from [facility] [employer] that Prior Authorization for Mounjaro 7.5MG /0.5ML auto-injectors has been APPROVED from 03/01/2024 to 03/01/2025. Ran test claim, Copay is $47.00. This test claim was processed through [facility]- copay amounts may vary at other pharmacies due to pharmacy/plan contracts, or as the patient moves through the different stages of their insurance plan.   PA #/Case ID/Reference #: [id number]

## 2024-03-12 ENCOUNTER — Encounter (HOSPITAL_BASED_OUTPATIENT_CLINIC_OR_DEPARTMENT_OTHER): Payer: PPO | Admitting: Internal Medicine

## 2024-03-26 ENCOUNTER — Telehealth: Payer: Self-pay

## 2024-03-26 NOTE — Progress Notes (Signed)
   03/26/2024  Patient ID: [name], female   DOB: [birth year], 58 y.o.   MRN: [id number]  Outreach attempt to follow-up on management of chronic disease states unsuccessful.  I was not able to reach patient or leave a voicemail; and it appears patient's follow-up with PCP later this month has been canceled.  Will attempt to reach patient again in a few weeks.  Lenna Gilford, PharmD, DPLA

## 2024-03-29 DIAGNOSIS — Z1159 Encounter for screening for other viral diseases: Secondary | ICD-10-CM | POA: Diagnosis not present

## 2024-03-29 DIAGNOSIS — Z794 Long term (current) use of insulin: Secondary | ICD-10-CM | POA: Diagnosis not present

## 2024-03-29 DIAGNOSIS — Z780 Asymptomatic menopausal state: Secondary | ICD-10-CM | POA: Diagnosis not present

## 2024-03-29 DIAGNOSIS — E119 Type 2 diabetes mellitus without complications: Secondary | ICD-10-CM | POA: Diagnosis not present

## 2024-03-29 DIAGNOSIS — Z0000 Encounter for general adult medical examination without abnormal findings: Secondary | ICD-10-CM | POA: Diagnosis not present

## 2024-03-29 DIAGNOSIS — Z119 Encounter for screening for infectious and parasitic diseases, unspecified: Secondary | ICD-10-CM | POA: Diagnosis not present

## 2024-03-29 DIAGNOSIS — I10 Essential (primary) hypertension: Secondary | ICD-10-CM | POA: Diagnosis not present

## 2024-03-29 DIAGNOSIS — Z6833 Body mass index (BMI) 33.0-33.9, adult: Secondary | ICD-10-CM | POA: Diagnosis not present

## 2024-03-29 DIAGNOSIS — R0602 Shortness of breath: Secondary | ICD-10-CM | POA: Diagnosis not present

## 2024-03-29 DIAGNOSIS — Z139 Encounter for screening, unspecified: Secondary | ICD-10-CM | POA: Diagnosis not present

## 2024-03-29 DIAGNOSIS — E7800 Pure hypercholesterolemia, unspecified: Secondary | ICD-10-CM | POA: Diagnosis not present

## 2024-03-29 DIAGNOSIS — Z8673 Personal history of transient ischemic attack (TIA), and cerebral infarction without residual deficits: Secondary | ICD-10-CM | POA: Diagnosis not present

## 2024-03-29 DIAGNOSIS — Z114 Encounter for screening for human immunodeficiency virus [HIV]: Secondary | ICD-10-CM | POA: Diagnosis not present

## 2024-04-12 DIAGNOSIS — Z794 Long term (current) use of insulin: Secondary | ICD-10-CM | POA: Diagnosis not present

## 2024-04-12 DIAGNOSIS — Z6833 Body mass index (BMI) 33.0-33.9, adult: Secondary | ICD-10-CM | POA: Diagnosis not present

## 2024-04-12 DIAGNOSIS — E7800 Pure hypercholesterolemia, unspecified: Secondary | ICD-10-CM | POA: Diagnosis not present

## 2024-04-12 DIAGNOSIS — Z8673 Personal history of transient ischemic attack (TIA), and cerebral infarction without residual deficits: Secondary | ICD-10-CM | POA: Diagnosis not present

## 2024-04-12 DIAGNOSIS — E119 Type 2 diabetes mellitus without complications: Secondary | ICD-10-CM | POA: Diagnosis not present

## 2024-04-12 DIAGNOSIS — Z780 Asymptomatic menopausal state: Secondary | ICD-10-CM | POA: Diagnosis not present

## 2024-04-12 DIAGNOSIS — I10 Essential (primary) hypertension: Secondary | ICD-10-CM | POA: Diagnosis not present

## 2024-04-12 DIAGNOSIS — R7401 Elevation of levels of liver transaminase levels: Secondary | ICD-10-CM | POA: Diagnosis not present

## 2024-04-24 DIAGNOSIS — R590 Localized enlarged lymph nodes: Secondary | ICD-10-CM | POA: Diagnosis not present

## 2024-04-24 DIAGNOSIS — J029 Acute pharyngitis, unspecified: Secondary | ICD-10-CM | POA: Diagnosis not present

## 2024-05-14 ENCOUNTER — Telehealth: Payer: Self-pay

## 2024-05-14 NOTE — Progress Notes (Unsigned)
 Complex Care Management Note Care Guide Note  05/14/2024 Name: [name] MRN: [id number] DOB: 03/24/1966   Complex Care Management Outreach Attempts: An unsuccessful telephone outreach was attempted today to offer the patient information about available complex care management services.  Follow Up Plan:  Additional outreach attempts will be made to offer the patient complex care management information and services.   Encounter Outcome:  No Answer  [facility]  [facility], [facility] Health Care Management Assistant Direct Dial: [phone]  Fax: [phone]

## 2024-05-16 NOTE — Progress Notes (Signed)
 Complex Care Management Care Guide Note  05/16/2024 Name: [name] MRN: [id number] DOB: 1966/11/02  [name] is a 58 y.o. year old female who is a primary care patient of Nche, Connye Delaine, NP and is actively engaged with the care management team. I reached out to [name] by phone today to assist with re-scheduling  with the Pharmacist.  Follow up plan: Unsuccessful telephone outreach attempt made. A HIPAA compliant phone message was left for the patient providing contact information and requesting a return call.  [facility]  [facility], [facility] Health Care Management Assistant Direct Dial: [phone]  Fax: [phone]

## 2024-05-16 NOTE — Progress Notes (Signed)
 Complex Care Management Care Guide Note  05/16/2024 Name: [name] MRN: [id number] DOB: Jun 23, 1966  [name] is a 58 y.o. year old female who is a primary care patient of Nche, Connye Delaine, NP and is actively engaged with the care management team. I reached out to [name] by phone today to assist with scheduling  with the Pharmacist.  Follow up plan: Telephone appointment with complex care management team member scheduled for:  05/24/24 at 1:00 p.m.  [facility]  [facility], [facility] Care Management Assistant Direct Dial: [phone]  Fax: [phone]

## 2024-05-17 DIAGNOSIS — E119 Type 2 diabetes mellitus without complications: Secondary | ICD-10-CM | POA: Diagnosis not present

## 2024-05-17 DIAGNOSIS — Z6833 Body mass index (BMI) 33.0-33.9, adult: Secondary | ICD-10-CM | POA: Diagnosis not present

## 2024-05-17 DIAGNOSIS — Z1211 Encounter for screening for malignant neoplasm of colon: Secondary | ICD-10-CM | POA: Diagnosis not present

## 2024-05-17 DIAGNOSIS — Z8673 Personal history of transient ischemic attack (TIA), and cerebral infarction without residual deficits: Secondary | ICD-10-CM | POA: Diagnosis not present

## 2024-05-17 DIAGNOSIS — E7800 Pure hypercholesterolemia, unspecified: Secondary | ICD-10-CM | POA: Diagnosis not present

## 2024-05-17 DIAGNOSIS — R7401 Elevation of levels of liver transaminase levels: Secondary | ICD-10-CM | POA: Diagnosis not present

## 2024-05-17 DIAGNOSIS — Z794 Long term (current) use of insulin: Secondary | ICD-10-CM | POA: Diagnosis not present

## 2024-05-17 DIAGNOSIS — I10 Essential (primary) hypertension: Secondary | ICD-10-CM | POA: Diagnosis not present

## 2024-05-23 DIAGNOSIS — R7401 Elevation of levels of liver transaminase levels: Secondary | ICD-10-CM | POA: Diagnosis not present

## 2024-05-24 ENCOUNTER — Other Ambulatory Visit: Payer: Self-pay

## 2024-05-24 NOTE — Progress Notes (Signed)
   05/24/2024  Patient ID: [name], female   DOB: 05-Oct-1966, 58 y.o.   MRN: [id number]  Patient outreach for scheduled telephone visit to follow-up on management of chronic disease states.  Outreach was successful, but patient states she has established care with a new PCP, Alena An, with [facility].  Sending Kathrene Parents, NP, a message to inform her of this.  Linn Rich, PharmD, DPLA

## 2024-06-17 DIAGNOSIS — E113511 Type 2 diabetes mellitus with proliferative diabetic retinopathy with macular edema, right eye: Secondary | ICD-10-CM | POA: Diagnosis not present

## 2024-06-20 DIAGNOSIS — K828 Other specified diseases of gallbladder: Secondary | ICD-10-CM | POA: Diagnosis not present

## 2024-07-04 DIAGNOSIS — R160 Hepatomegaly, not elsewhere classified: Secondary | ICD-10-CM | POA: Diagnosis not present

## 2024-07-04 DIAGNOSIS — Z1211 Encounter for screening for malignant neoplasm of colon: Secondary | ICD-10-CM | POA: Diagnosis not present

## 2024-07-04 DIAGNOSIS — Z8673 Personal history of transient ischemic attack (TIA), and cerebral infarction without residual deficits: Secondary | ICD-10-CM | POA: Diagnosis not present

## 2024-07-04 DIAGNOSIS — E7800 Pure hypercholesterolemia, unspecified: Secondary | ICD-10-CM | POA: Diagnosis not present

## 2024-07-04 DIAGNOSIS — K828 Other specified diseases of gallbladder: Secondary | ICD-10-CM | POA: Diagnosis not present

## 2024-07-04 DIAGNOSIS — K760 Fatty (change of) liver, not elsewhere classified: Secondary | ICD-10-CM | POA: Diagnosis not present

## 2024-07-04 DIAGNOSIS — Z794 Long term (current) use of insulin: Secondary | ICD-10-CM | POA: Diagnosis not present

## 2024-07-04 DIAGNOSIS — Z79899 Other long term (current) drug therapy: Secondary | ICD-10-CM | POA: Diagnosis not present

## 2024-07-04 DIAGNOSIS — I10 Essential (primary) hypertension: Secondary | ICD-10-CM | POA: Diagnosis not present

## 2024-07-04 DIAGNOSIS — Z6834 Body mass index (BMI) 34.0-34.9, adult: Secondary | ICD-10-CM | POA: Diagnosis not present

## 2024-07-04 DIAGNOSIS — E119 Type 2 diabetes mellitus without complications: Secondary | ICD-10-CM | POA: Diagnosis not present

## 2024-07-04 DIAGNOSIS — K8020 Calculus of gallbladder without cholecystitis without obstruction: Secondary | ICD-10-CM | POA: Diagnosis not present

## 2024-07-17 DIAGNOSIS — K828 Other specified diseases of gallbladder: Secondary | ICD-10-CM | POA: Diagnosis not present

## 2024-07-17 DIAGNOSIS — Z1211 Encounter for screening for malignant neoplasm of colon: Secondary | ICD-10-CM | POA: Diagnosis not present

## 2024-07-17 DIAGNOSIS — R7401 Elevation of levels of liver transaminase levels: Secondary | ICD-10-CM | POA: Diagnosis not present

## 2024-07-17 DIAGNOSIS — K760 Fatty (change of) liver, not elsewhere classified: Secondary | ICD-10-CM | POA: Diagnosis not present

## 2024-07-19 ENCOUNTER — Encounter (HOSPITAL_COMMUNITY)
Admission: EM | Disposition: A | Discharge status: Home / Self Care | Payer: Self-pay | Source: Home / Self Care | Attending: Cardiovascular Disease

## 2024-07-19 ENCOUNTER — Inpatient Hospital Stay (HOSPITAL_COMMUNITY)
Admission: EM | Admit: 2024-07-19 | Discharge: 2024-07-20 | DRG: 322 | Disposition: A | Discharge status: Home / Self Care | Attending: Cardiology | Admitting: Cardiology

## 2024-07-19 DIAGNOSIS — I69351 Hemiplegia and hemiparesis following cerebral infarction affecting right dominant side: Secondary | ICD-10-CM | POA: Diagnosis not present

## 2024-07-19 DIAGNOSIS — Z7982 Long term (current) use of aspirin: Secondary | ICD-10-CM

## 2024-07-19 DIAGNOSIS — Z9861 Coronary angioplasty status: Secondary | ICD-10-CM

## 2024-07-19 DIAGNOSIS — Z833 Family history of diabetes mellitus: Secondary | ICD-10-CM | POA: Diagnosis not present

## 2024-07-19 DIAGNOSIS — Z6836 Body mass index (BMI) 36.0-36.9, adult: Secondary | ICD-10-CM | POA: Diagnosis not present

## 2024-07-19 DIAGNOSIS — E669 Obesity, unspecified: Secondary | ICD-10-CM | POA: Diagnosis not present

## 2024-07-19 DIAGNOSIS — Z79899 Other long term (current) drug therapy: Secondary | ICD-10-CM

## 2024-07-19 DIAGNOSIS — Z955 Presence of coronary angioplasty implant and graft: Secondary | ICD-10-CM

## 2024-07-19 DIAGNOSIS — Z8249 Family history of ischemic heart disease and other diseases of the circulatory system: Secondary | ICD-10-CM

## 2024-07-19 DIAGNOSIS — I10 Essential (primary) hypertension: Secondary | ICD-10-CM | POA: Diagnosis not present

## 2024-07-19 DIAGNOSIS — I2510 Atherosclerotic heart disease of native coronary artery without angina pectoris: Secondary | ICD-10-CM | POA: Diagnosis not present

## 2024-07-19 DIAGNOSIS — I2119 ST elevation (STEMI) myocardial infarction involving other coronary artery of inferior wall: Principal | ICD-10-CM | POA: Diagnosis present

## 2024-07-19 DIAGNOSIS — Z820 Family history of epilepsy and other diseases of the nervous system: Secondary | ICD-10-CM | POA: Diagnosis not present

## 2024-07-19 DIAGNOSIS — E119 Type 2 diabetes mellitus without complications: Secondary | ICD-10-CM | POA: Diagnosis present

## 2024-07-19 DIAGNOSIS — E785 Hyperlipidemia, unspecified: Secondary | ICD-10-CM | POA: Diagnosis not present

## 2024-07-19 DIAGNOSIS — I2111 ST elevation (STEMI) myocardial infarction involving right coronary artery: Secondary | ICD-10-CM | POA: Diagnosis not present

## 2024-07-19 DIAGNOSIS — I6930 Unspecified sequelae of cerebral infarction: Secondary | ICD-10-CM

## 2024-07-19 DIAGNOSIS — Z7985 Long-term (current) use of injectable non-insulin antidiabetic drugs: Secondary | ICD-10-CM

## 2024-07-19 DIAGNOSIS — Z789 Other specified health status: Secondary | ICD-10-CM | POA: Diagnosis present

## 2024-07-19 DIAGNOSIS — Z888 Allergy status to other drugs, medicaments and biological substances status: Secondary | ICD-10-CM | POA: Diagnosis not present

## 2024-07-19 HISTORY — PX: CORONARY/GRAFT ACUTE MI REVASCULARIZATION: CATH118305

## 2024-07-19 HISTORY — PX: LEFT HEART CATH AND CORONARY ANGIOGRAPHY: CATH118249

## 2024-07-19 LAB — POCT I-STAT, CHEM 8
BUN: 10 mg/dL (ref 6–20)
Calcium, Ion: 1.27 mmol/L (ref 1.15–1.40)
Chloride: 103 mmol/L (ref 98–111)
Creatinine, Ser: 0.7 mg/dL (ref 0.44–1.00)
Glucose, Bld: 235 mg/dL — ABNORMAL HIGH (ref 70–99)
HCT: 34 % — ABNORMAL LOW (ref 36.0–46.0)
Hemoglobin: 11.6 g/dL — ABNORMAL LOW (ref 12.0–15.0)
Potassium: 3.2 mmol/L — ABNORMAL LOW (ref 3.5–5.1)
Sodium: 140 mmol/L (ref 135–145)
TCO2: 23 mmol/L (ref 22–32)

## 2024-07-19 LAB — BASIC METABOLIC PANEL WITH GFR
Anion gap: 10 (ref 5–15)
BUN: 9 mg/dL (ref 6–20)
CO2: 22 mmol/L (ref 22–32)
Calcium: 9 mg/dL (ref 8.9–10.3)
Chloride: 104 mmol/L (ref 98–111)
Creatinine, Ser: 0.78 mg/dL (ref 0.44–1.00)
GFR, Estimated: >60 mL/min (ref >60)
Glucose, Bld: 233 mg/dL — ABNORMAL HIGH (ref 70–99)
Potassium: 3.8 mmol/L (ref 3.5–5.1)
Sodium: 136 mmol/L (ref 135–145)

## 2024-07-19 LAB — POCT ACTIVATED CLOTTING TIME: Activated Clotting Time: 360 s

## 2024-07-19 LAB — CBC
HCT: 35.8 % — ABNORMAL LOW (ref 36.0–46.0)
Hemoglobin: 11.9 g/dL — ABNORMAL LOW (ref 12.0–15.0)
MCH: 27.9 pg (ref 26.0–34.0)
MCHC: 33.2 g/dL (ref 30.0–36.0)
MCV: 83.8 fL (ref 80.0–100.0)
Platelets: 291 K/uL (ref 150–400)
RBC: 4.27 MIL/uL (ref 3.87–5.11)
RDW: 14.1 % (ref 11.5–15.5)
WBC: 9.9 K/uL (ref 4.0–10.5)
nRBC: 0 % (ref 0.0–0.2)

## 2024-07-19 LAB — CG4 I-STAT (LACTIC ACID): Lactic Acid, Venous: 2 mmol/L (ref 0.5–1.9)

## 2024-07-19 LAB — MAGNESIUM: Magnesium: 1.6 mg/dL — ABNORMAL LOW (ref 1.7–2.4)

## 2024-07-19 LAB — TROPONIN I (HIGH SENSITIVITY): Troponin I (High Sensitivity): >24000 ng/L (ref <18)

## 2024-07-19 LAB — MRSA NEXT GEN BY PCR, NASAL: MRSA by PCR Next Gen: NOT DETECTED

## 2024-07-19 SURGERY — LEFT HEART CATH AND CORONARY ANGIOGRAPHY
Anesthesia: LOCAL

## 2024-07-19 MED ORDER — HYDRALAZINE HCL 20 MG/ML IJ SOLN: 10.0000 mg | INTRAMUSCULAR | Status: AC | PRN

## 2024-07-19 MED ORDER — HYDROCHLOROTHIAZIDE 12.5 MG PO TABS
12.5000 mg | ORAL_TABLET | Freq: Every day | ORAL | Status: DC
  Administered 2024-07-20: 12.5 mg via ORAL
  Filled 2024-07-19: qty 1

## 2024-07-19 MED ORDER — MIDAZOLAM HCL 2 MG/2ML IJ SOLN
INTRAMUSCULAR | Status: AC
  Filled 2024-07-19: qty 2

## 2024-07-19 MED ORDER — TIROFIBAN HCL IN NACL 5-0.9 MG/100ML-% IV SOLN: 0.1500 ug/kg/min | INTRAVENOUS | Status: AC

## 2024-07-19 MED ORDER — FENTANYL CITRATE (PF) 100 MCG/2ML IJ SOLN
INTRAMUSCULAR | Status: DC | PRN
  Administered 2024-07-19: 25 ug via INTRAVENOUS

## 2024-07-19 MED ORDER — INSULIN ASPART 100 UNIT/ML IJ SOLN
0.0000 [IU] | Freq: Three times a day (TID) | INTRAMUSCULAR | Status: DC
  Administered 2024-07-20: 3 [IU] via SUBCUTANEOUS
  Administered 2024-07-20: 2 [IU] via SUBCUTANEOUS

## 2024-07-19 MED ORDER — AMLODIPINE BESYLATE 5 MG PO TABS
10.0000 mg | ORAL_TABLET | Freq: Every day | ORAL | Status: DC
  Administered 2024-07-20: 10 mg via ORAL
  Filled 2024-07-19: qty 2

## 2024-07-19 MED ORDER — LABETALOL HCL 5 MG/ML IV SOLN: 10.0000 mg | INTRAVENOUS | Status: AC | PRN

## 2024-07-19 MED ORDER — SODIUM CHLORIDE 0.9 % IV SOLN: INTRAVENOUS | Status: AC

## 2024-07-19 MED ORDER — IOHEXOL 350 MG/ML SOLN
INTRAVENOUS | Status: DC | PRN
  Administered 2024-07-19: 65 mL

## 2024-07-19 MED ORDER — SODIUM CHLORIDE 0.9% FLUSH: 3.0000 mL | INTRAVENOUS | Status: DC | PRN

## 2024-07-19 MED ORDER — METOPROLOL SUCCINATE ER 25 MG PO TB24
25.0000 mg | ORAL_TABLET | Freq: Every day | ORAL | Status: DC
  Administered 2024-07-20: 25 mg via ORAL
  Filled 2024-07-19: qty 1

## 2024-07-19 MED ORDER — SODIUM CHLORIDE 0.9% FLUSH
3.0000 mL | Freq: Two times a day (BID) | INTRAVENOUS | Status: DC
  Administered 2024-07-20: 3 mL via INTRAVENOUS

## 2024-07-19 MED ORDER — SODIUM CHLORIDE 0.9 % IV SOLN: 250.0000 mL | INTRAVENOUS | Status: DC | PRN

## 2024-07-19 MED ORDER — LOSARTAN POTASSIUM 50 MG PO TABS
100.0000 mg | ORAL_TABLET | Freq: Every day | ORAL | Status: DC
  Administered 2024-07-20: 100 mg via ORAL
  Filled 2024-07-19: qty 2

## 2024-07-19 MED ORDER — TIROFIBAN HCL IN NACL 5-0.9 MG/100ML-% IV SOLN
0.1500 ug/kg/min | INTRAVENOUS | Status: DC
  Administered 2024-07-19: 0.15 ug/kg/min via INTRAVENOUS

## 2024-07-19 MED ORDER — TIROFIBAN (AGGRASTAT) BOLUS VIA INFUSION
INTRAVENOUS | Status: DC | PRN
  Administered 2024-07-19: 1995 ug via INTRAVENOUS

## 2024-07-19 MED ORDER — HEPARIN SODIUM (PORCINE) 1000 UNIT/ML IJ SOLN
INTRAMUSCULAR | Status: DC | PRN
  Administered 2024-07-19: 10000 [IU] via INTRAVENOUS

## 2024-07-19 MED ORDER — PHENYLEPHRINE 80 MCG/ML (10ML) SYRINGE FOR IV PUSH (FOR BLOOD PRESSURE SUPPORT)
PREFILLED_SYRINGE | INTRAVENOUS | Status: AC
  Filled 2024-07-19: qty 10

## 2024-07-19 MED ORDER — LIDOCAINE HCL (PF) 1 % IJ SOLN
INTRAMUSCULAR | Status: AC
  Filled 2024-07-19: qty 30

## 2024-07-19 MED ORDER — TICAGRELOR 90 MG PO TABS
ORAL_TABLET | ORAL | Status: AC
  Filled 2024-07-19: qty 1

## 2024-07-19 MED ORDER — ONDANSETRON HCL 4 MG/2ML IJ SOLN: 4.0000 mg | Freq: Four times a day (QID) | INTRAMUSCULAR | Status: DC | PRN

## 2024-07-19 MED ORDER — FENTANYL CITRATE (PF) 100 MCG/2ML IJ SOLN
INTRAMUSCULAR | Status: AC
  Filled 2024-07-19: qty 2

## 2024-07-19 MED ORDER — VERAPAMIL HCL 2.5 MG/ML IV SOLN
INTRAVENOUS | Status: AC
  Filled 2024-07-19: qty 2

## 2024-07-19 MED ORDER — LOSARTAN POTASSIUM-HCTZ 100-12.5 MG PO TABS: 1.0000 | ORAL_TABLET | Freq: Every day | ORAL | Status: DC

## 2024-07-19 MED ORDER — TICAGRELOR 90 MG PO TABS
90.0000 mg | ORAL_TABLET | Freq: Two times a day (BID) | ORAL | Status: DC
  Administered 2024-07-20: 90 mg via ORAL
  Filled 2024-07-19: qty 1

## 2024-07-19 MED ORDER — MIDAZOLAM HCL 2 MG/2ML IJ SOLN
INTRAMUSCULAR | Status: DC | PRN
  Administered 2024-07-19: 1 mg via INTRAVENOUS

## 2024-07-19 MED ORDER — TICAGRELOR 90 MG PO TABS
ORAL_TABLET | ORAL | Status: DC | PRN
  Administered 2024-07-19: 180 mg via ORAL

## 2024-07-19 MED ORDER — VERAPAMIL HCL 2.5 MG/ML IV SOLN
INTRAVENOUS | Status: DC | PRN
  Administered 2024-07-19: 10 mL via INTRA_ARTERIAL

## 2024-07-19 MED ORDER — ACETAMINOPHEN 325 MG PO TABS
650.0000 mg | ORAL_TABLET | ORAL | Status: DC | PRN
Start: 2024-07-19 — End: 2024-07-20
  Administered 2024-07-19 – 2024-07-20 (×2): 650 mg via ORAL
  Filled 2024-07-19 (×2): qty 2

## 2024-07-19 MED ORDER — ASPIRIN 81 MG PO TBEC
81.0000 mg | DELAYED_RELEASE_TABLET | Freq: Every day | ORAL | Status: DC
Start: 2024-07-20 — End: 2024-07-20
  Administered 2024-07-20: 81 mg via ORAL
  Filled 2024-07-19: qty 1

## 2024-07-19 MED ORDER — TIROFIBAN HCL IN NACL 5-0.9 MG/100ML-% IV SOLN
INTRAVENOUS | Status: AC
  Filled 2024-07-19: qty 100

## 2024-07-19 MED ORDER — HEPARIN SODIUM (PORCINE) 1000 UNIT/ML IJ SOLN
INTRAMUSCULAR | Status: AC
  Filled 2024-07-19: qty 10

## 2024-07-19 MED ORDER — LIDOCAINE HCL (PF) 1 % IJ SOLN
INTRAMUSCULAR | Status: DC | PRN
  Administered 2024-07-19: 5 mL

## 2024-07-19 SURGICAL SUPPLY — 13 items
BALLOON SAPPHIRE 2.0X15 (BALLOONS) IMPLANT
BALLOON SAPPHIRE NC24 3.25X22 (BALLOONS) IMPLANT
CATH INFINITI 5 FR JL3.5 (CATHETERS) IMPLANT
CATH VISTA GUIDE 6FR JR4 ECOPK (CATHETERS) IMPLANT
DEVICE RAD TR BAND REGULAR (VASCULAR PRODUCTS) IMPLANT
GLIDESHEATH SLEND SS 6F .021 (SHEATH) IMPLANT
GUIDEWIRE INQWIRE 1.5J.035X260 (WIRE) IMPLANT
KIT ENCORE 26 ADVANTAGE (KITS) IMPLANT
PACK CARDIAC CATHETERIZATION (CUSTOM PROCEDURE TRAY) ×1 IMPLANT
SET ATX-X65L (MISCELLANEOUS) IMPLANT
STENT SYNERGY XD 3.0X38 (Permanent Stent) IMPLANT
TUBING CIL FLEX 10 FLL-RA (TUBING) IMPLANT
WIRE ASAHI PROWATER 180CM (WIRE) IMPLANT

## 2024-07-19 NOTE — H&P (Signed)
 Cardiology Admission History and Physical   Patient ID: [name] MRN: [id number]; DOB: [birth year]   Admission date: 07/19/2024  PCP:  No primary care provider on file.   [facility] Providers Cardiologist:  None     Chief Complaint:  Chest pain   History of Present Illness: [name] is a 58 year old female with history of CVA, DM, HTN who began having chest pain thirty minutes ago. Code STEMI called by EMS. Pt with ongoing chest pain on arrival to [facility]. EKG with 1-2 mm inferior ST elevation.    Past Medical History:  Diagnosis Date   Diabetes mellitus without complication (HCC) 2013   previously on victoza    Gestational diabetes 2009    Hypertension 09/07/2014   Stroke (HCC) 09/07/2014   R side. weakness in upper arm. No deficit in leg. deficit in speech. A littel bit in coordination.    Past Surgical History:  Procedure Laterality Date   BREAST SURGERY     CESAREAN SECTION     TUBAL LIGATION  2009     Medications Prior to Admission: Prior to Admission medications   Medication Sig Start Date End Date Taking? Authorizing Provider  amLODipine  (NORVASC ) 10 MG tablet Take 1 tablet (10 mg total) by mouth daily. 10/23/23   Nche, Roselie Rockford, NP  aspirin  EC 81 MG tablet Take 1 tablet (81 mg total) by mouth daily. 09/26/17   Vicci Barnie NOVAK, MD  Blood Glucose Monitoring Suppl (TRUE METRIX METER) w/Device KIT 1 each by Does not apply route as needed. Patient not taking: Reported on 01/23/2024 10/13/16   Funches, Josalyn, MD  Continuous Glucose Sensor (FREESTYLE LIBRE 2 SENSOR) MISC INJECT 1 SENSOR INTO THE SKIN EVERY 14 DAYS FOR CONTINUOUS GLUCOSE MONITORING 02/09/24   Nche, Roselie Rockford, NP  Evolocumab  (REPATHA  SURECLICK) 140 MG/ML SOAJ Inject 140 mg into the skin every 14 (fourteen) days. Patient not taking: Reported on 01/23/2024 10/25/22   Mona Vinie BROCKS, MD  glucose blood (TRUE METRIX BLOOD GLUCOSE TEST) test strip 1 each by Other route 2 (two) times  daily. Patient not taking: Reported on 01/23/2024 10/13/16   Funches, Josalyn, MD  insulin  glargine (LANTUS ) 100 UNIT/ML Solostar Pen Inject 15 Units into the skin daily. 10/23/23   Nche, Roselie Rockford, NP  Insulin  Pen Needle (PEN NEEDLES) 31G X 6 MM MISC 1 application  by Does not apply route at bedtime. 12/30/22   Nche, Roselie Rockford, NP  losartan -hydrochlorothiazide (HYZAAR) 100-12.5 MG tablet Take 1 tablet by mouth daily. 10/23/23   Nche, Roselie Rockford, NP  metoprolol  succinate (TOPROL -XL) 25 MG 24 hr tablet Take 1 tablet (25 mg total) by mouth daily. 10/23/23   Nche, Roselie Rockford, NP  MOUNJARO  7.5 MG/0.5ML Pen INJECT 7.5 MG SUBCUTANEOUSLY ONCE A WEEK 02/29/24   Nche, Roselie Rockford, NP  TRUEPLUS LANCETS 28G MISC 1 each by Does not apply route 2 (two) times daily. Patient not taking: Reported on 01/23/2024 10/13/16   Funches, Josalyn, MD     Allergies:    Allergies  Allergen Reactions   Lipitor [Atorvastatin ] Other (See Comments)    Made me feel woozy   Lovastatin  Other (See Comments)   Other Other (See Comments)    Woozy feeling   Statins     Social History:   Social History   Socioeconomic History   Marital status: Married    Spouse name: Not on file   Number of children: 5   Years of education: associates  Highest education level: Not on file  Occupational History   Occupation: disabled  Tobacco Use   Smoking status: Never   Smokeless tobacco: Never  Vaping Use   Vaping status: Never Used  Substance and Sexual Activity   Alcohol  use: No   Drug use: Not Currently    Types: Marijuana   Sexual activity: Not Currently  Other Topics Concern   Not on file  Social History Narrative   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   Social Drivers of Corporate investment banker Strain: Low Risk  (04/07/2023)   Overall Financial Resource Strain (CARDIA)    Difficulty of Paying Living Expenses: Not hard at all  Food Insecurity: No Food  Insecurity (04/07/2023)   Hunger Vital Sign    Worried About Running Out of Food in the Last Year: Never true    Ran Out of Food in the Last Year: Never true  Transportation Needs: No Transportation Needs (04/07/2023)   PRAPARE - Administrator, Civil Service (Medical): No    Lack of Transportation (Non-Medical): No  Physical Activity: Sufficiently Active (04/07/2023)   Exercise Vital Sign    Days of Exercise per Week: 7 days    Minutes of Exercise per Session: 60 min  Stress: No Stress Concern Present (04/07/2023)   [employer] of Occupational Health - Occupational Stress Questionnaire    Feeling of Stress : Not at all  Social Connections: Unknown (10/07/2022)   [facility] from [facility]   Social Network    Social Network: Not on file  Recent Concern: Social Connections - Moderately Isolated (08/09/2022)   Social Connection and Isolation Panel    Frequency of Communication with Friends and Family: Three times a week    Frequency of Social Gatherings with Friends and Family: Three times a week    Attends Religious Services: Never    Active Member of Clubs or Organizations: No    Attends Banker Meetings: Never    Marital Status: Married  Catering manager Violence: Unknown (10/07/2022)   Received from [facility]   HITS    Physically Hurt: Not on file    Insult or Talk Down To: Not on file    Threaten Physical Harm: Not on file    Scream or Curse: Not on file     Family History:   The patient's family history includes Diabetes in her mother; Epilepsy in her father; Hypertension in her mother and sister. There is no history of Colon cancer.    ROS:  Please see the history of present illness.  All other ROS reviewed and negative.     Physical Exam/Data: There were no vitals filed for this visit. No intake or output data in the 24 hours ending 07/19/24 1736    11/01/2023    9:33 AM 10/23/2023   11:04 AM 04/13/2023    3:56 PM  Last 3 Weights   Weight (lbs) 173 lb 12.8 oz 173 lb 12.8 oz 167 lb 6.4 oz  Weight (kg) 78.835 kg 78.835 kg 75.932 kg     There is no height or weight on file to calculate BMI.  General:  Well nourished, well developed, appears uncomfortable HEENT: normal Neck: no JVD Vascular: No carotid bruits; Distal pulses 2+ bilaterally   Cardiac:  normal S1, S2; RRR; no murmur  Lungs:  clear to auscultation bilaterally, no wheezing, rhonchi or rales  Abd: soft, nontender, no hepatomegaly  Ext: no LE edema Musculoskeletal:  No deformities, BUE and BLE strength normal and equal Skin: warm and dry  Neuro:  CNs 2-12 intact, no focal abnormalities noted Psych:  Normal affect   EKG:  The ECG that was done was personally reviewed and demonstrates sinus with inferior ST elevation  Relevant CV Studies:  Laboratory Data: High Sensitivity Troponin:  No results for input(s): TROPONINIHS in the last 720 hours.    ChemistryNo results for input(s): NA, K, CL, CO2, GLUCOSE, BUN, CREATININE, CALCIUM , MG, GFRNONAA, GFRAA, ANIONGAP in the last 168 hours.  No results for input(s): PROT, ALBUMIN, AST, ALT, ALKPHOS, BILITOT in the last 168 hours. Lipids No results for input(s): CHOL, TRIG, HDL, LABVLDL, LDLCALC, CHOLHDL in the last 168 hours. HematologyNo results for input(s): WBC, RBC, HGB, HCT, MCV, MCH, MCHC, RDW, PLT in the last 168 hours. Thyroid  No results for input(s): TSH, FREET4 in the last 168 hours. BNPNo results for input(s): BNP, PROBNP in the last 168 hours.  DDimer No results for input(s): DDIMER in the last 168 hours.  Radiology/Studies:  No results found.   Assessment and Plan:  Acute inferior STEMI: Plan emergent cardiac cath.   Code Status: Full Code  Severity of Illness: The appropriate patient status for this patient is INPATIENT. Inpatient status is judged to be reasonable and necessary in order to provide the required  intensity of service to ensure the patient's safety. The patient's presenting symptoms, physical exam findings, and initial radiographic and laboratory data in the context of their chronic comorbidities is felt to place them at high risk for further clinical deterioration. Furthermore, it is not anticipated that the patient will be medically stable for discharge from the hospital within 2 midnights of admission.   * I certify that at the point of admission it is my clinical judgment that the patient will require inpatient hospital care spanning beyond 2 midnights from the point of admission due to high intensity of service, high risk for further deterioration and high frequency of surveillance required.*  For questions or updates, please contact [facility] Please consult [email or web address] for contact info under     Signed, Lonni Cash, MD  07/19/2024 5:36 PM

## 2024-07-20 ENCOUNTER — Encounter: Payer: Self-pay | Admitting: Family

## 2024-07-20 ENCOUNTER — Other Ambulatory Visit: Payer: Self-pay | Admitting: Student

## 2024-07-20 ENCOUNTER — Inpatient Hospital Stay (HOSPITAL_COMMUNITY)

## 2024-07-20 ENCOUNTER — Encounter (HOSPITAL_COMMUNITY): Payer: Self-pay | Admitting: Cardiovascular Disease

## 2024-07-20 ENCOUNTER — Telehealth: Payer: Self-pay | Admitting: Student

## 2024-07-20 ENCOUNTER — Other Ambulatory Visit (HOSPITAL_COMMUNITY): Payer: Self-pay

## 2024-07-20 DIAGNOSIS — I2119 ST elevation (STEMI) myocardial infarction involving other coronary artery of inferior wall: Secondary | ICD-10-CM

## 2024-07-20 DIAGNOSIS — I2510 Atherosclerotic heart disease of native coronary artery without angina pectoris: Secondary | ICD-10-CM

## 2024-07-20 DIAGNOSIS — E785 Hyperlipidemia, unspecified: Secondary | ICD-10-CM

## 2024-07-20 LAB — BASIC METABOLIC PANEL WITH GFR
Anion gap: 7 (ref 5–15)
BUN: 9 mg/dL (ref 6–20)
CO2: 23 mmol/L (ref 22–32)
Calcium: 8.6 mg/dL — ABNORMAL LOW (ref 8.9–10.3)
Chloride: 108 mmol/L (ref 98–111)
Creatinine, Ser: 0.73 mg/dL (ref 0.44–1.00)
GFR, Estimated: >60 mL/min (ref >60)
Glucose, Bld: 203 mg/dL — ABNORMAL HIGH (ref 70–99)
Potassium: 3.8 mmol/L (ref 3.5–5.1)
Sodium: 138 mmol/L (ref 135–145)

## 2024-07-20 LAB — CBC
HCT: 32.5 % — ABNORMAL LOW (ref 36.0–46.0)
Hemoglobin: 10.9 g/dL — ABNORMAL LOW (ref 12.0–15.0)
MCH: 28.1 pg (ref 26.0–34.0)
MCHC: 33.5 g/dL (ref 30.0–36.0)
MCV: 83.8 fL (ref 80.0–100.0)
Platelets: 268 K/uL (ref 150–400)
RBC: 3.88 MIL/uL (ref 3.87–5.11)
RDW: 14.3 % (ref 11.5–15.5)
WBC: 8 K/uL (ref 4.0–10.5)
nRBC: 0 % (ref 0.0–0.2)

## 2024-07-20 LAB — ECHOCARDIOGRAM COMPLETE
AR max vel: 1.91 cm2
AV Peak grad: 7.1 mmHg
Ao pk vel: 1.33 m/s
Area-P 1/2: 3.91 cm2
S' Lateral: 2.5 cm
Weight: 2895.96 [oz_av]

## 2024-07-20 LAB — LIPID PANEL
Cholesterol: 192 mg/dL (ref 0–200)
HDL: 46 mg/dL (ref >40)
LDL Cholesterol: 125 mg/dL — ABNORMAL HIGH (ref 0–99)
Total CHOL/HDL Ratio: 4.2 ratio
Triglycerides: 103 mg/dL (ref <150)
VLDL: 21 mg/dL (ref 0–40)

## 2024-07-20 LAB — GLUCOSE, CAPILLARY
Glucose-Capillary: 185 mg/dL — ABNORMAL HIGH (ref 70–99)
Glucose-Capillary: 145 mg/dL — ABNORMAL HIGH (ref 70–99)

## 2024-07-20 MED ORDER — METOPROLOL SUCCINATE ER 25 MG PO TB24
25.0000 mg | ORAL_TABLET | Freq: Every day | ORAL | 1 refills | Status: DC
  Filled 2024-07-20: qty 90, 90d supply, fill #0

## 2024-07-20 MED ORDER — EZETIMIBE 10 MG PO TABS
10.0000 mg | ORAL_TABLET | Freq: Every day | ORAL | 1 refills | Status: AC
Start: 2024-07-21 — End: ?
  Filled 2024-07-20: qty 90, 90d supply, fill #0

## 2024-07-20 MED ORDER — ALUM & MAG HYDROXIDE-SIMETH 200-200-20 MG/5ML PO SUSP: 30.0000 mL | ORAL | Status: DC | PRN

## 2024-07-20 MED ORDER — TICAGRELOR 90 MG PO TABS
90.0000 mg | ORAL_TABLET | Freq: Two times a day (BID) | ORAL | 11 refills | Status: AC
  Filled 2024-07-20: qty 60, 30d supply, fill #0

## 2024-07-20 MED ORDER — EZETIMIBE 10 MG PO TABS
10.0000 mg | ORAL_TABLET | Freq: Every day | ORAL | Status: DC
  Administered 2024-07-20: 10 mg via ORAL
  Filled 2024-07-20: qty 1

## 2024-07-20 MED ORDER — CHLORHEXIDINE GLUCONATE CLOTH 2 % EX PADS
6.0000 | MEDICATED_PAD | Freq: Every day | CUTANEOUS | Status: DC
  Administered 2024-07-20: 6 via TOPICAL

## 2024-07-20 MED ORDER — ASPIRIN EC 81 MG PO TBEC
81.0000 mg | DELAYED_RELEASE_TABLET | Freq: Every day | ORAL | 3 refills | Status: AC
  Filled 2024-07-20 (×2): qty 90, 90d supply, fill #0

## 2024-07-20 NOTE — Progress Notes (Signed)
 Echocardiogram 2D Echocardiogram has been performed.  [name] 07/20/2024, 10:46 AM

## 2024-07-20 NOTE — Discharge Summary (Cosign Needed)
 Discharge Summary   Patient ID: [name] MRN: [id number]; DOB: [birth year]  Admit date: 07/19/2024 Discharge date: 07/20/2024  PCP:  Claudene Round, MD   [facility] Providers Cardiologist:  Lonni Cash, MD       Discharge Diagnoses  Principal Problem:   Acute ST elevation myocardial infarction (STEMI) of inferior wall [facility]) Active Problems:   Essential hypertension   Hyperlipidemia LDL goal <55   Statin intolerance   Diagnostic Studies/Procedures   Cardiac Catheterization: 07/19/2024   3rd Mrg lesion is 50% stenosed.   1st Mrg lesion is 50% stenosed.   Mid LAD lesion is 50% stenosed.   Prox RCA lesion is 100% stenosed.   Mid RCA lesion is 80% stenosed.   A drug-eluting stent was successfully placed using a STENT SYNERGY XD 3.0X38.   Post intervention, there is a 0% residual stenosis.   Post intervention, there is a 0% residual stenosis.   Acute inferior STEMI secondary to thrombotic occlusion of the proximal RCA Successful PTCA/DES x 1 proximal to mid RCA Moderate non-obstructive disease in the LAD and Circumflex   Recommendations: Admit to the ICU. Aggrastat  drip for 2 hours. DAPT with ASA and Brilinta  for 12 months. She is statin intolerant. Continue beta blocker. SSI coverage. Echo tomorrow. Possible fast track discharge.   Echocardiogram: 07/20/2024  1. Left ventricular ejection fraction, by estimation, is 60 to 65%. The  left ventricle has normal function. The left ventricle has no regional  wall motion abnormalities. Left ventricular diastolic parameters were  normal.   2. Right ventricular systolic function is normal. The right ventricular  size is normal.   3. The mitral valve is normal in structure. No evidence of mitral valve  regurgitation. No evidence of mitral stenosis.   4. The aortic valve is tricuspid. There is mild calcification of the  aortic valve. Aortic valve regurgitation is not visualized. Aortic valve  sclerosis is present,  with no evidence of aortic valve stenosis.   5. The inferior vena cava is normal in size with greater than 50%  respiratory variability, suggesting right atrial pressure of 3 mmHg.     History of Present Illness   [name] is a 58 y.o. female with past medical history of HTN, Type 2 DM and prior CVA who presented to [facility] on 07/19/2024 as a CODE STEMI.   She presented to [facility] via EMS on 07/19/2024 for chest pain which had started earlier in the day and initial EKG showed 1-2 mm ST elevation along the inferior leads. She went for emergent cardiac catheterization by Dr. Cash.   Hospital Course   Consultants: None   Catheterization showed 100% thrombotic occlusion of the proximal-RCA and treated with PTCA/DES x1. She had residual moderate non-obstrutive CAD along the LAD and LCx. Was recommended to be on DAPT with ASA and Brilinta  for 12 months.   She was examined by Dr. Rolan the following morning and reported overall feeling well. Denied any recurrent pain. Echocardiogram showed a preserved EF of 60-65% with no regional WMA and no significant valve abnormalities. Was restarted on her home blood pressure medications. Zetia  10mg  daily was also added to her medication regimen given her statin intolerance and referral for [facility] was entered to help with arranging PCSK9i therapy as an outpatient (LDL 125 this admission and LPa pending). Follow-up has been arranged.     Did the patient have an acute coronary syndrome (MI, NSTEMI, STEMI, etc) this admission?:  Yes  AHA/ACC ACS Clinical Performance & Quality Measures: Aspirin  prescribed? - Yes ADP Receptor Inhibitor (Plavix/Clopidogrel, Brilinta /Ticagrelor  or Effient/Prasugrel) prescribed (includes medically managed patients)? - Yes Beta Blocker prescribed? - Yes High Intensity Statin (Lipitor 40-80mg  or Crestor  20-40mg ) prescribed? - No - Statin intolerant. EF assessed during THIS  hospitalization? - Yes For EF <40%, was ACEI/ARB prescribed? - Not Applicable (EF >/= 40%) For EF <40%, Aldosterone Antagonist (Spironolactone or Eplerenone) prescribed? - Not Applicable (EF >/= 40%) Cardiac Rehab Phase II ordered (including medically managed patients)? - Yes      The patient will be scheduled for a TOC follow up appointment in 9 days.  A message has been sent to the [facility] Pool at the office where the patient should be seen for follow up.  _____________  Discharge Vitals Blood pressure (!) 132/58, pulse 85, temperature 97.7 F (36.5 C), temperature source Oral, resp. rate 16, weight 82.1 kg, last menstrual period 05/19/2017, SpO2 97%.  Filed Weights   07/19/24 1900  Weight: 82.1 kg    Labs & Radiologic Studies  CBC Recent Labs    07/19/24 2025 07/20/24 0315  WBC 9.9 8.0  HGB 11.9* 10.9*  HCT 35.8* 32.5*  MCV 83.8 83.8  PLT 291 268   Basic Metabolic Panel Recent Labs    91/98/74 2025 07/20/24 0315  NA 136 138  K 3.8 3.8  CL 104 108  CO2 22 23  GLUCOSE 233* 203*  BUN 9 9  CREATININE 0.78 0.73  CALCIUM  9.0 8.6*  MG 1.6*  --    Liver Function Tests No results for input(s): AST, ALT, ALKPHOS, BILITOT, PROT, ALBUMIN in the last 72 hours. No results for input(s): LIPASE, AMYLASE in the last 72 hours. High Sensitivity Troponin:   Recent Labs  Lab 07/19/24 2025  TROPONINIHS >24,000*    No results for input(s): TRNPT in the last 720 hours.  BNP Invalid input(s): POCBNP No results for input(s): PROBNP in the last 72 hours.  No results for input(s): BNP in the last 72 hours.  D-Dimer No results for input(s): DDIMER in the last 72 hours. Hemoglobin A1C No results for input(s): HGBA1C in the last 72 hours. Fasting Lipid Panel Recent Labs    07/20/24 0315  CHOL 192  HDL 46  LDLCALC 125*  TRIG 103  CHOLHDL 4.2   No results found for: LIPOA  Thyroid  Function Tests No results for input(s): TSH, T4TOTAL, T3FREE,  THYROIDAB in the last 72 hours.  Invalid input(s): FREET3 _____________   Disposition Pt is being discharged home today in good condition.  Follow-up Plans & Appointments  Follow-up Information     Janene Boer, [place] Follow up on 07/29/2024.   Specialties: Cardiology, Radiology Why: Cardiology Hospital Follow-up on 07/29/2024 at 2:45 PM. Contact information: [address] [place] [address] [phone]                Discharge Instructions     Amb Referral to Cardiac Rehabilitation   Complete by: As directed    Diagnosis:  STEMI Coronary Stents PTCA     After initial evaluation and assessments completed: Virtual Based Care may be provided alone or in conjunction with Phase 2 Cardiac Rehab based on patient barriers.: Yes   Intensive Cardiac Rehabilitation (ICR) [facility] location only OR Traditional Cardiac Rehabilitation (TCR) *If criteria for ICR are not met will enroll in TCR [facility] only): Yes   Diet - low sodium heart healthy   Complete by: As directed    Discharge instructions  Complete by: As directed    Radial Site Care Refer to this sheet in the next few weeks. These instructions provide you with information on caring for yourself after your procedure. Your caregiver may also give you more specific instructions. Your treatment has been planned according to current medical practices, but problems sometimes occur. Call your caregiver if you have any problems or questions after your procedure. HOME CARE INSTRUCTIONS You may shower the day after the procedure. Remove the bandage (dressing) and gently wash the site with plain soap and water. Gently pat the site dry.  Do not apply powder or lotion to the site.  Do not submerge the affected site in water for 3 to 5 days.  Inspect the site at least twice daily.  Do not flex or bend the affected arm for 24 hours.  No lifting over 5 pounds (2.3 kg) for 5 days after your procedure.  Do not drive home if you are  discharged the same day of the procedure. Have someone else drive you.  You may drive 24 hours after the procedure unless otherwise instructed by your caregiver.  What to expect: Any bruising will usually fade within 1 to 2 weeks.  Blood that collects in the tissue (hematoma) may be painful to the touch. It should usually decrease in size and tenderness within 1 to 2 weeks.  SEEK IMMEDIATE MEDICAL CARE IF: You have unusual pain at the radial site.  You have redness, warmth, swelling, or pain at the radial site.  You have drainage (other than a small amount of blood on the dressing).  You have chills.  You have a fever or persistent symptoms for more than 72 hours.  You have a fever and your symptoms suddenly get worse.  Your arm becomes pale, cool, tingly, or numb.  You have heavy bleeding from the site. Hold pressure on the site.   Increase activity slowly   Complete by: As directed        Discharge Medications Allergies as of 07/20/2024       Reactions   Lipitor [atorvastatin ] Other (See Comments)   Made me feel woozy   Lovastatin  Other (See Comments)   Other Other (See Comments)   Woozy feeling   Statins         Medication List     TAKE these medications    amLODipine  10 MG tablet Commonly known as: NORVASC  Take 1 tablet (10 mg total) by mouth daily.   aspirin  EC 81 MG tablet Take 1 tablet (81 mg total) by mouth daily.   ezetimibe  10 MG tablet Commonly known as: ZETIA  Take 1 tablet (10 mg total) by mouth daily. Start taking on: July 21, 2024   Farxiga 5 MG Tabs tablet Generic drug: dapagliflozin propanediol Take 5 mg by mouth in the morning.   FreeStyle Libre 2 Sensor Misc INJECT 1 SENSOR INTO THE SKIN EVERY 14 DAYS FOR CONTINUOUS GLUCOSE MONITORING   glucose blood test strip Commonly known as: True Metrix Blood Glucose Test 1 each by Other route 2 (two) times daily.   losartan -hydrochlorothiazide  100-12.5 MG tablet Commonly known as: HYZAAR Take 1  tablet by mouth daily.   metoprolol  succinate 25 MG 24 hr tablet Commonly known as: TOPROL -XL Take 1 tablet (25 mg total) by mouth daily. Start taking on: July 21, 2024   Mounjaro  7.5 MG/0.5ML Pen Generic drug: tirzepatide  INJECT 7.5 MG SUBCUTANEOUSLY ONCE A WEEK What changed: See the new instructions.   Pen Needles 31G X 6  MM Misc 1 application  by Does not apply route at bedtime.   ticagrelor  90 MG Tabs tablet Commonly known as: BRILINTA  Take 1 tablet (90 mg total) by mouth 2 (two) times daily.   True Metrix Meter w/Device Kit 1 each by Does not apply route as needed.   TRUEplus Lancets 28G Misc 1 each by Does not apply route 2 (two) times daily.   UNABLE TO FIND Take 1 tablet by mouth daily. Passion Flower         Outstanding Labs/Studies None  Duration of Discharge Encounter: APP Time: 28 minutes   Signed, [name], PA-C 07/20/2024, 12:36 PM  Patient seen with PA, I formulated the plan and agree with the above note.   Please see my note from earlier today with my thoughts.   Physician discharge time 31 minutes.   Ezra Shuck 07/21/2024  .

## 2024-07-20 NOTE — Telephone Encounter (Signed)
   Transition of Care Follow-up Phone Call Request    Patient Name: [name] Date of Birth: 25-May-1966 Date of Encounter: 07/20/2024  Primary Care Provider:  Claudene Round, MD Primary Cardiologist:  Lonni Cash, MD  [name] has been scheduled for a transition of care follow up appointment with a [facility] provider:  07/29/2024 with Hao Meng, PA  Please reach out to [facility] within 48 hours of discharge to confirm appointment and review transition of care protocol questionnaire.  Anticipated discharge date: 07/20/2024  [name], DEVONNA  07/20/2024, 10:49 AM

## 2024-07-20 NOTE — Progress Notes (Signed)
 CARDIAC REHAB PHASE I   Ed given to pt. Discussed restrictions, MI booklet, NTG use, ASA and Brilinta , stent, site care, heart healthy diet, and exercise guidelines. Pt left in bed w/ call bell in reach. Will refer to [facility] [place]. Will continue to follow.  [phone] Johnnie JINNY Moats, MS, ACSM-CEP 07/20/2024 11:59 AM

## 2024-07-20 NOTE — Progress Notes (Signed)
 Patient ID: [name], female   DOB: March 21, 1966, 58 y.o.   MRN: [id number]     Advanced Heart Failure Rounding Note  Cardiologist: None  Chief Complaint: STEMI Subjective:    Patient is doing well this morning, sitting up in chair.  No chest pain or dyspnea.    Objective:   Weight Range: 82.1 kg Body mass index is 36.56 kg/m.   Vital Signs:   Temp:  [98 F (36.7 C)-98.4 F (36.9 C)] 98.1 F (36.7 C) (08/02 0758) Pulse Rate:  [0-99] 75 (08/02 0800) Resp:  [9-34] 16 (08/02 0800) BP: (90-192)/(54-99) 116/61 (08/02 0800) SpO2:  [97 %-100 %] 99 % (08/02 0800) Weight:  [82.1 kg] 82.1 kg (08/01 1900) Last BM Date : 07/19/24  Weight change: Filed Weights   07/19/24 1900  Weight: 82.1 kg    Intake/Output:   Intake/Output Summary (Last 24 hours) at 07/20/2024 0920 Last data filed at 07/20/2024 0800 Gross per 24 hour  Intake 821.47 ml  Output 1800 ml  Net -978.53 ml      Physical Exam    General:  Well appearing. No resp difficulty HEENT: Normal Neck: Supple. JVP not elevated. Carotids 2+ bilat; no bruits. No lymphadenopathy or thyromegaly appreciated. Cor: PMI nondisplaced. Regular rate & rhythm. No rubs, gallops or murmurs. Lungs: Clear Abdomen: Soft, nontender, nondistended. No hepatosplenomegaly. No bruits or masses. Good bowel sounds. Extremities: No cyanosis, clubbing, rash, edema. 2+ left radial pulse.  Neuro: Mild right-sided weakness  Telemetry   NSR 80s (personally reviewed)  EKG    NSR, resolving inferior STE (personally reviewed)  Labs    CBC Recent Labs    07/19/24 2025 07/20/24 0315  WBC 9.9 8.0  HGB 11.9* 10.9*  HCT 35.8* 32.5*  MCV 83.8 83.8  PLT 291 268   Basic Metabolic Panel Recent Labs    91/98/74 2025 07/20/24 0315  NA 136 138  K 3.8 3.8  CL 104 108  CO2 22 23  GLUCOSE 233* 203*  BUN 9 9  CREATININE 0.78 0.73  CALCIUM  9.0 8.6*  MG 1.6*  --    Liver Function Tests No results for input(s): AST, ALT, ALKPHOS,  BILITOT, PROT, ALBUMIN in the last 72 hours. No results for input(s): LIPASE, AMYLASE in the last 72 hours. Cardiac Enzymes No results for input(s): CKTOTAL, CKMB, CKMBINDEX, TROPONINI in the last 72 hours.  BNP: BNP (last 3 results) No results for input(s): BNP in the last 8760 hours.  ProBNP (last 3 results) No results for input(s): PROBNP in the last 8760 hours.   D-Dimer No results for input(s): DDIMER in the last 72 hours. Hemoglobin A1C No results for input(s): HGBA1C in the last 72 hours. Fasting Lipid Panel No results for input(s): CHOL, HDL, LDLCALC, TRIG, CHOLHDL, LDLDIRECT in the last 72 hours. Thyroid  Function Tests No results for input(s): TSH, T4TOTAL, T3FREE, THYROIDAB in the last 72 hours.  Invalid input(s): FREET3  Other results:   Imaging    CARDIAC CATHETERIZATION Result Date: 07/19/2024   3rd Mrg lesion is 50% stenosed.   1st Mrg lesion is 50% stenosed.   Mid LAD lesion is 50% stenosed.   Prox RCA lesion is 100% stenosed.   Mid RCA lesion is 80% stenosed.   A drug-eluting stent was successfully placed using a STENT SYNERGY XD 3.0X38.   Post intervention, there is a 0% residual stenosis.   Post intervention, there is a 0% residual stenosis. Acute inferior STEMI secondary to thrombotic occlusion of the proximal RCA  Successful PTCA/DES x 1 proximal to mid RCA Moderate non-obstructive disease in the LAD and Circumflex Recommendations: Admit to the ICU. Aggrastat  drip for 2 hours. DAPT with ASA and Brilinta  for 12 months. She is statin intolerant. Continue beta blocker. SSI coverage. Echo tomorrow. Possible fast track discharge.     Medications:     Scheduled Medications:  amLODipine   10 mg Oral Daily   aspirin  EC  81 mg Oral Daily   Chlorhexidine  Gluconate Cloth  6 each Topical Daily   losartan   100 mg Oral Daily   And   hydrochlorothiazide   12.5 mg Oral Daily   insulin  aspart  0-15 Units Subcutaneous TID WC    metoprolol  succinate  25 mg Oral Daily   sodium chloride  flush  3 mL Intravenous Q12H   ticagrelor   90 mg Oral BID    Infusions:  sodium chloride       PRN Medications: sodium chloride , acetaminophen , alum & mag hydroxide-simeth, ondansetron  (ZOFRAN ) IV, sodium chloride  flush     Assessment/Plan   1. CAD: Acute inferior MI, LHC 8/1 showed occluded RCA with nonobstructive LCx and LAD disease.  She is now s/p DES to proximal/mid RCA. No further chest pain.  - ASA 81/ticagrelor .  - Statin intolerant, will start Zetia  10 mg daily for now and will have her followup with lipid clinic to get on Repatha .  - Echo today.  2. HTN: Restarted her home meds, Toprol  XL + amlodipine  + losartan /hydrochlorothiazide .  3. H/o CVA: 2015, residual mild right-sided weakness.  4. DM2: Continue home regimen.   She can go home today after echo.  Will make sure she has close cardiology followup.   Length of Stay: 1  Ezra Shuck, MD  07/20/2024, 9:20 AM  Advanced Heart Failure Team Pager [phone] (M-F; 7a - 5p)  Please contact [facility] for night-coverage after hours (5p -7a ) and weekends on [email or web address]

## 2024-07-22 ENCOUNTER — Telehealth: Payer: Self-pay

## 2024-07-22 LAB — LIPOPROTEIN A (LPA): Lipoprotein (a): 163.9 nmol/L — ABNORMAL HIGH (ref <75.0)

## 2024-07-22 MED ORDER — NITROGLYCERIN 0.4 MG SL SUBL: 0.4000 mg | SUBLINGUAL_TABLET | SUBLINGUAL | 3 refills | Status: AC | PRN

## 2024-07-22 NOTE — Telephone Encounter (Signed)
 Patient contacted regarding discharge from [facility] on 07/20/24.  Patient understands to follow up with provider meng on 07/29/24 at 2:45 pm  at [address]. Patient understands discharge instructions? yes Patient understands medications and regiment? yes Patient understands to bring all medications to this visit? yes  Ask patient:  Are you enrolled in My Chart yes If no ask patient if they would like to enroll.   NTG script sent to the pharmacy.

## 2024-07-22 NOTE — Transitions of Care (Post Inpatient/ED Visit) (Signed)
   07/22/2024  Name: [name] MRN: [id number] DOB: 08/04/1966  Today's TOC FU Call Status: Today's TOC FU Call Status:: Successful TOC FU Call Completed TOC FU Call Complete Date: 07/22/24 Patient's Name and Date of Birth confirmed.  Transition Care Management Follow-up Telephone Call Date of Discharge: 07/20/24 Discharge Facility: [facility]) Type of Discharge: Inpatient Admission Primary Inpatient Discharge Diagnosis:: Acute ST elevation myocardial infarct of inferrior wall How have you been since you were released from the hospital?: Better Any questions or concerns?: No  Items Reviewed: Did you receive and understand the discharge instructions provided?: Yes  Medications Reviewed Today: Medications Reviewed Today   Medications were not reviewed in this encounter      Follow up appointments reviewed: PCP Follow-up appointment confirmed?: No (I am no longer with [facility] [place], I am with [facility] medical)   Richerd Fish, RN, BSN, CCM [place]  [facility], [facility] RN Care Manager Direct Dial: [phone]

## 2024-07-24 ENCOUNTER — Encounter: Payer: Self-pay | Admitting: Family

## 2024-07-24 ENCOUNTER — Telehealth (HOSPITAL_COMMUNITY): Payer: Self-pay

## 2024-07-24 LAB — HEMOGLOBIN A1C
Hgb A1c MFr Bld: 8.8 % — ABNORMAL HIGH (ref 4.8–5.6)
Mean Plasma Glucose: 206 mg/dL

## 2024-07-24 NOTE — Telephone Encounter (Signed)
 Called patient to see if she is interested in the Cardiac Rehab Program. Patient expressed interest. Explained scheduling process and went over insurance, patient verbalized understanding. Will contact patient for scheduling once f/u has been completed.

## 2024-07-24 NOTE — Telephone Encounter (Signed)
 Pt insurance is active and benefits verified through [employer] adv Co-pay $15, DED 0/0 met, out of pocket $3,400/$1,207.98 met, co-insurance 0%. no pre-authorization required, [employer] 07/24/2024@3 :56, REF# [id number]   TCR/ICR? ICR Visit(date of service)limitation? No limit Can multiple codes be used on the same date of service/visit?(IF ITS A LIMIT) n/a Is this a lifetime maximum or an annual maximum? annual Has the member used any of these services to date? no Is there a time limit (weeks/months) on start of program and/or program completion? no     Will contact patient to see if she is interested in the Cardiac Rehab Program. If interested, patient will need to complete follow up appt. Once completed, patient will be contacted for scheduling upon review by the RN Navigator.

## 2024-07-29 ENCOUNTER — Ambulatory Visit: Attending: Cardiovascular Disease | Admitting: Physician Assistant

## 2024-07-29 ENCOUNTER — Encounter: Payer: Self-pay | Admitting: Physician Assistant

## 2024-07-29 VITALS — BP 140/70 | HR 83 | Ht 59.0 in | Wt 174.2 lb

## 2024-07-29 DIAGNOSIS — E119 Type 2 diabetes mellitus without complications: Secondary | ICD-10-CM

## 2024-07-29 DIAGNOSIS — I2510 Atherosclerotic heart disease of native coronary artery without angina pectoris: Secondary | ICD-10-CM

## 2024-07-29 DIAGNOSIS — E785 Hyperlipidemia, unspecified: Secondary | ICD-10-CM

## 2024-07-29 DIAGNOSIS — T466X5D Adverse effect of antihyperlipidemic and antiarteriosclerotic drugs, subsequent encounter: Secondary | ICD-10-CM

## 2024-07-29 DIAGNOSIS — Z8673 Personal history of transient ischemic attack (TIA), and cerebral infarction without residual deficits: Secondary | ICD-10-CM

## 2024-07-29 DIAGNOSIS — M791 Myalgia, unspecified site: Secondary | ICD-10-CM | POA: Diagnosis not present

## 2024-07-29 DIAGNOSIS — I10 Essential (primary) hypertension: Secondary | ICD-10-CM

## 2024-07-29 DIAGNOSIS — T466X5A Adverse effect of antihyperlipidemic and antiarteriosclerotic drugs, initial encounter: Secondary | ICD-10-CM

## 2024-07-29 MED ORDER — METOPROLOL SUCCINATE ER 50 MG PO TB24: 50.0000 mg | ORAL_TABLET | Freq: Every day | ORAL | 1 refills | Status: AC

## 2024-07-29 NOTE — Progress Notes (Signed)
 Cardiology Office Note   Date:  07/31/2024  ID:  [name], DOB 1966/01/29, MRN [id number] PCP: Claudene Round, MD  [facility] Providers Cardiologist:  Lonni Cash, MD     History of Present Illness [name] is a 58 y.o. female with past medical history of hypertension, hyperlipidemia, DM 2, and history of CVA.  Patient was previously followed by Dr. Mona for management of hyperlipidemia.  She was previously approved for Repatha  in 2023.  More recently, patient presented to the hospital as a code STEMI on 07/19/2024.  Emergent cardiac catheterization performed on the same day showed 50% OM1, 50% OM 3, 50% mid LAD, 100% occluded proximal RCA, 80% mid RCA lesion.  She underwent DES to proximal and mid RCA.  Postprocedure, patient was started on aspirin  and Brilinta .  She was statin intolerant.  Echocardiogram obtained on the following morning showed EF 60 to 65%, no regional wall motion abnormality, normal RV, no significant valve issue.  Serial troponin was greater than 24,000.  Lipoprotein a is elevated at 163.9.  LDL was 125 during the hospitalization.  Hemoglobin A1c was 8.8.  Patient presents today for follow-up.  She denies any further chest discomfort.  She has only been taking Brilinta  once a day, I asked her to start taking Brilinta  twice a day.  She has not taken the amlodipine .  I will remove amlodipine  from her med list.  I will also increase metoprolol  succinate to 50 mg daily.  Emphasis has been placed on compliance with dual antiplatelet therapy.  She was started on Zetia  during this hospitalization due to intolerance of statin.  She really need either PCSK9 inhibitor versus inclisiran.  She wished to keep Dr. Mona as her lipid specialist and follow with Dr. Cash as her general cardiologist.  She needed the earliest follow-up with Dr. Mona available to discuss PCSK9 inhibitor versus inclisiran.  She can follow-up with Dr. Cash in 3 to 4 months.  Once she  is started on cholesterol medication, will defer to Dr. Mona regarding repeat blood work such as fasting lipid panel and LFT.  EKG today showed Q waves in the inferior lead with minimal ST elevation consistent with the evolutionary changes from the recent MI.  ROS:   She denies chest pain, palpitations, dyspnea, pnd, orthopnea, n, v, dizziness, syncope, edema, weight gain, or early satiety. All other systems reviewed and are otherwise negative except as noted above.    Studies Reviewed EKG Interpretation Date/Time:  Monday July 29 2024 15:00:04 EDT Ventricular Rate:  83 PR Interval:  160 QRS Duration:  70 QT Interval:  354 QTC Calculation: 415 R Axis:   -23  Text Interpretation: Normal sinus rhythm Q wave in the inferior leads along with minimal inferior ST elevation likely evolutionary changes from recent inferior STEMI Minimal ST inversion in the lateral leads Confirmed by Janene Boer [phone]) on 07/29/2024 4:22:51 PM    Cardiac Studies & Procedures   ______________________________________________________________________________________________ CARDIAC CATHETERIZATION  CARDIAC CATHETERIZATION 07/19/2024  Conclusion   3rd Mrg lesion is 50% stenosed.   1st Mrg lesion is 50% stenosed.   Mid LAD lesion is 50% stenosed.   Prox RCA lesion is 100% stenosed.   Mid RCA lesion is 80% stenosed.   A drug-eluting stent was successfully placed using a STENT SYNERGY XD 3.0X38.   Post intervention, there is a 0% residual stenosis.   Post intervention, there is a 0% residual stenosis.  Acute inferior STEMI secondary to thrombotic occlusion of the  proximal RCA Successful PTCA/DES x 1 proximal to mid RCA Moderate non-obstructive disease in the LAD and Circumflex  Recommendations: Admit to the ICU. Aggrastat  drip for 2 hours. DAPT with ASA and Brilinta  for 12 months. She is statin intolerant. Continue beta blocker. SSI coverage. Echo tomorrow. Possible fast track discharge.  Findings Coronary  Findings Diagnostic  Dominance: Right  Left Anterior Descending Mid LAD lesion is 50% stenosed.  Left Circumflex Vessel is moderate in size.  First Obtuse Marginal Branch 1st Mrg lesion is 50% stenosed.  Third Obtuse Marginal Branch 3rd Mrg lesion is 50% stenosed.  Right Coronary Artery Vessel is large. Prox RCA lesion is 100% stenosed. The lesion is thrombotic. Mid RCA lesion is 80% stenosed.  Intervention  Prox RCA lesion Stent (Also treats lesions: Mid RCA) CATH VISTA GUIDE 6FR JR4 ECOPK guide catheter was inserted. Lesion crossed with guidewire using a WIRE ASAHI PROWATER 180CM. Pre-stent angioplasty was performed using a BALLOON SAPPHIRE 2.0X15. A drug-eluting stent was successfully placed using a STENT SYNERGY XD 3.0X38. Stent strut is well apposed. Post-stent angioplasty was performed using a BALLOON SAPPHIRE NC24 [id number]. Post-Intervention Lesion Assessment The intervention was successful. Pre-interventional TIMI flow is 0. Post-intervention TIMI flow is 3. No complications occurred at this lesion. There is a 0% residual stenosis post intervention.  Mid RCA lesion Stent (Also treats lesions: Prox RCA) See details in Prox RCA lesion. Post-Intervention Lesion Assessment The intervention was successful. Pre-interventional TIMI flow is 0. Post-intervention TIMI flow is 3. No complications occurred at this lesion. There is a 0% residual stenosis post intervention.     ECHOCARDIOGRAM  ECHOCARDIOGRAM COMPLETE 07/20/2024  Narrative ECHOCARDIOGRAM REPORT    Patient Name:   [name] Date of Exam: 07/20/2024 Medical Rec #:  [id number]      Height:       59.0 in Accession #:    [id number]     Weight:       181.0 lb Date of Birth:  06-12-1966      BSA:          1.768 m Patient Age:    58 years       BP:           110/69 mmHg Patient Gender: F              HR:           78 bpm. Exam Location:  Inpatient  Procedure: 2D Echo, Cardiac Doppler and Color Doppler (Both  Spectral and Color Flow Doppler were utilized during procedure).  Indications:    CAD Native Vessel I25.10  History:        Patient has no prior history of Echocardiogram examinations. Acute MI, Stroke; Risk Factors:Hypertension, Diabetes and Dyslipidemia.  Sonographer:    Thea Norlander RCS Referring Phys: LONNI JONETTA CASH  IMPRESSIONS   1. Left ventricular ejection fraction, by estimation, is 60 to 65%. The left ventricle has normal function. The left ventricle has no regional wall motion abnormalities. Left ventricular diastolic parameters were normal. 2. Right ventricular systolic function is normal. The right ventricular size is normal. 3. The mitral valve is normal in structure. No evidence of mitral valve regurgitation. No evidence of mitral stenosis. 4. The aortic valve is tricuspid. There is mild calcification of the aortic valve. Aortic valve regurgitation is not visualized. Aortic valve sclerosis is present, with no evidence of aortic valve stenosis. 5. The inferior vena cava is normal in size with greater than 50% respiratory variability, suggesting right  atrial pressure of 3 mmHg.  FINDINGS Left Ventricle: Left ventricular ejection fraction, by estimation, is 60 to 65%. The left ventricle has normal function. The left ventricle has no regional wall motion abnormalities. Strain was performed and the global longitudinal strain is indeterminate. The left ventricular internal cavity size was normal in size. There is no left ventricular hypertrophy. Left ventricular diastolic parameters were normal.  Right Ventricle: The right ventricular size is normal. No increase in right ventricular wall thickness. Right ventricular systolic function is normal.  Left Atrium: Left atrial size was normal in size.  Right Atrium: Right atrial size was normal in size.  Pericardium: There is no evidence of pericardial effusion.  Mitral Valve: The mitral valve is normal in structure. No  evidence of mitral valve regurgitation. No evidence of mitral valve stenosis.  Tricuspid Valve: The tricuspid valve is normal in structure. Tricuspid valve regurgitation is trivial. No evidence of tricuspid stenosis.  Aortic Valve: The aortic valve is tricuspid. There is mild calcification of the aortic valve. Aortic valve regurgitation is not visualized. Aortic valve sclerosis is present, with no evidence of aortic valve stenosis. Aortic valve peak gradient measures 7.1 mmHg.  Pulmonic Valve: The pulmonic valve was normal in structure. Pulmonic valve regurgitation is not visualized. No evidence of pulmonic stenosis.  Aorta: The aortic root is normal in size and structure.  Venous: The inferior vena cava is normal in size with greater than 50% respiratory variability, suggesting right atrial pressure of 3 mmHg.  IAS/Shunts: No atrial level shunt detected by color flow Doppler.  Additional Comments: 3D was performed not requiring image post processing on an independent workstation and was indeterminate.   LEFT VENTRICLE PLAX 2D LVIDd:         4.00 cm   Diastology LVIDs:         2.50 cm   LV e' medial:    6.74 cm/s LV PW:         0.90 cm   LV E/e' medial:  11.4 LV IVS:        0.70 cm   LV e' lateral:   9.25 cm/s LVOT diam:     1.80 cm   LV E/e' lateral: 8.3 LV SV:         54 LV SV Index:   31 LVOT Area:     2.54 cm   RIGHT VENTRICLE            IVC RV S prime:     9.90 cm/s  IVC diam: 2.10 cm TAPSE (M-mode): 1.7 cm  LEFT ATRIUM             Index        RIGHT ATRIUM          Index LA diam:        3.60 cm 2.04 cm/m   RA Area:     8.87 cm LA Vol (A2C):   40.2 ml 22.74 ml/m  RA Volume:   15.80 ml 8.94 ml/m LA Vol (A4C):   30.3 ml 17.14 ml/m LA Biplane Vol: 35.5 ml 20.08 ml/m AORTIC VALVE AV Area (Vmax): 1.91 cm AV Vmax:        133.00 cm/s AV Peak Grad:   7.1 mmHg LVOT Vmax:      99.60 cm/s LVOT Vmean:     66.600 cm/s LVOT VTI:       0.214 m  AORTA Ao Root diam: 2.90  cm Ao Asc diam:  2.70 cm  MITRAL VALVE MV  Area (PHT): 3.91 cm    SHUNTS MV Decel Time: 194 msec    Systemic VTI:  0.21 m MV E velocity: 76.50 cm/s  Systemic Diam: 1.80 cm MV A velocity: 82.80 cm/s MV E/A ratio:  0.92  Maude Emmer MD Electronically signed by Maude Emmer MD Signature Date/Time: 07/20/2024/11:35:20 AM    Final          ______________________________________________________________________________________________      Risk Assessment/Calculations          Physical Exam VS:  BP (!) 140/70   Pulse 83   Ht 4' 11 (1.499 m)   Wt 174 lb 3.2 oz (79 kg)   LMP 05/19/2017   SpO2 98%   BMI 35.18 kg/m        Wt Readings from Last 3 Encounters:  07/29/24 174 lb 3.2 oz (79 kg)  07/19/24 181 lb (82.1 kg)  11/01/23 173 lb 12.8 oz (78.8 kg)    GEN: Well nourished, well developed in no acute distress NECK: No JVD; No carotid bruits CARDIAC: RRR, no murmurs, rubs, gallops RESPIRATORY:  Clear to auscultation without rales, wheezing or rhonchi  ABDOMEN: Soft, non-tender, non-distended EXTREMITIES:  No edema; No deformity   ASSESSMENT AND PLAN  CAD: Recently had a inferior STEMI and underwent DES to proximal and mid RCA.  Troponin was greater than 24,000.  EF 60 to 65% on echocardiogram.  Since discharge, she is only taking Brilinta  once a day instead of twice a day.  I asked her to increase Brilinta  to twice a day dosing.  She has been compliant with aspirin .  Hypertension: Blood pressure mildly elevated, I recommended increase metoprolol  succinate to 50 mg daily  Hyperlipidemia: She is intolerant of statins, patient is currently on Zetia .  She will follow-up with Dr. Mona to consider PCSK9 inhibitor versus inclisiran  DM2: Managed by her primary care provider.  History of CVA: No recent recurrence.    Cardiac Rehabilitation Eligibility Assessment  The patient is ready to start cardiac rehabilitation from a cardiac standpoint.       Dispo: Follow-up  with Dr. Verlin in 3 to 54-month.  Signed, Scot Ford, PA

## 2024-07-29 NOTE — Patient Instructions (Addendum)
 Medication Instructions:  1.Increase metoprolol  succinate (Toprol  XL) to 50 mg daily 2.Be sure to take Brilinta  TWICE daily 3.Call and let us  know if you have difficulty affording your medications, do not just stop them on your own 4.Call the [facility] pharmacy at [phone] and ask that they transfer your prescriptions to your preferred pharmacy *If you need a refill on your cardiac medications before your next appointment, please call your pharmacy*  Lab Work: None ordered If you have labs (blood work) drawn today and your tests are completely normal, you will receive your results only by: MyChart Message (if you have MyChart) OR A paper copy in the mail If you have any lab test that is abnormal or we need to change your treatment, we will call you to review the results.  Follow-Up: At [facility], you and your health needs are our priority.  As part of our continuing mission to provide you with exceptional heart care, our providers are all part of one team.  This team includes your primary Cardiologist (physician) and Advanced Practice Providers or APPs (Physician Assistants and Nurse Practitioners) who all work together to provide you with the care you need, when you need it.  Your next appointment:   3-4 month(s)  Provider:   Lonni Cash, MD  Schedule next available with Dr Mona to discuss PCSK9 inhibitor versus inclisiran for management of cholesterol.

## 2024-07-30 ENCOUNTER — Other Ambulatory Visit (HOSPITAL_COMMUNITY): Payer: Self-pay

## 2024-07-30 DIAGNOSIS — E113511 Type 2 diabetes mellitus with proliferative diabetic retinopathy with macular edema, right eye: Secondary | ICD-10-CM | POA: Diagnosis not present

## 2024-08-02 ENCOUNTER — Telehealth (HOSPITAL_COMMUNITY): Payer: Self-pay

## 2024-08-02 NOTE — Telephone Encounter (Signed)
 Attempted to call and schedule cardiac rehab- no answer, left message. Sent MyChart message.

## 2024-08-03 DIAGNOSIS — Z1211 Encounter for screening for malignant neoplasm of colon: Secondary | ICD-10-CM | POA: Diagnosis not present

## 2024-08-03 DIAGNOSIS — Z1212 Encounter for screening for malignant neoplasm of rectum: Secondary | ICD-10-CM | POA: Diagnosis not present

## 2024-08-10 LAB — COLOGUARD: COLOGUARD: NEGATIVE

## 2024-08-15 ENCOUNTER — Telehealth: Payer: Self-pay

## 2024-08-15 NOTE — Telephone Encounter (Signed)
 Patient was identified as falling into the [place] Measure - Diabetes.   Patient was: Patient is not currently using our practice. Attribution and/or data issue.  Validation/Investigation needed.  Explanation:  Patient no longer sees a provider in this clinic as of 07/20/24.

## 2024-08-16 ENCOUNTER — Telehealth: Payer: Self-pay | Admitting: Pharmacy Technician

## 2024-08-16 ENCOUNTER — Telehealth: Payer: Self-pay | Admitting: Pharmacist

## 2024-08-16 ENCOUNTER — Ambulatory Visit: Attending: Cardiology | Admitting: Pharmacist

## 2024-08-16 ENCOUNTER — Other Ambulatory Visit (HOSPITAL_COMMUNITY): Payer: Self-pay

## 2024-08-16 ENCOUNTER — Telehealth (HOSPITAL_COMMUNITY): Payer: Self-pay

## 2024-08-16 ENCOUNTER — Encounter: Payer: Self-pay | Admitting: Pharmacist

## 2024-08-16 DIAGNOSIS — E785 Hyperlipidemia, unspecified: Secondary | ICD-10-CM

## 2024-08-16 MED ORDER — REPATHA SURECLICK 140 MG/ML ~~LOC~~ SOAJ
140.0000 mg | SUBCUTANEOUS | 3 refills | Status: AC
Start: 2024-08-16 — End: ?

## 2024-08-16 NOTE — Assessment & Plan Note (Signed)
 Assessment:  LDL goal: < 125 mg/dl last LDLc 874 mg/dl post NSTEMI lab, was not on any therapy  Zetia  started in the hospital - tolerates it well  Intolerance to statins - rosuvastatin  10 mg, atorvastatin  40 mg and lovastatin  40 mg  Discussed next potential options (PCSK-9 inhibitors, bempedoic acid and inclisiran); cost, dosing efficacy, side effects  Follows heart healthy diet and before NSTEMI was going to gym 4 days per week regularly   Plan: Continue taking current medications (Zetia  10 mg daily) Start taking Repatha  140 mg every 14 days  F/u lipid and Lp(a) due Nov 2025

## 2024-08-16 NOTE — Progress Notes (Signed)
 Patient ID: [name]                 DOB: Jul 23, 1966                    MRN: [id number]      HPI: [name] is a 58 y.o. female patient referred to lipid clinic by [place], PA-C. PMH is significant for premature CAD, recent NSTEMI, DM, HTN, hx of CVA,GAD, HLD   Patient was previously followed by Dr. Mona for management of hyperlipidemia. She was previously approved for Repatha  in 2023. More recently, patient presented to the hospital as a code STEMI on 07/19/2024. Emergent cardiac catheterization performed on the same day showed 50% OM1, 50% OM 3, 50% mid LAD, 100% occluded proximal RCA, 80% mid RCA lesion. She underwent DES to proximal and mid RCA. Postprocedure, patient was started on aspirin  and Brilinta . She was statin intolerant. Lipoprotein a is elevated at 163.9.  LDL was 125 during the hospitalization.  Hemoglobin A1c was 8.8. She was started on Zetia  during this hospitalization due to intolerance of statin.    Patient presented today for lipid clinic. Reports she is tolerating Zetia  well. Her mother and mother sie family has cholesterol problem. She eats clean whole plant based food and before episode she was going to gym 4 times per week regularly.  Reviewed options for lowering LDL cholesterol, including ezetimibe , PCSK-9 inhibitors, bempedoic acid and inclisiran.  Discussed mechanisms of action, dosing, side effects and potential decreases in LDL cholesterol.  Also reviewed cost information and potential options for patient assistance.   We received Repatha  approval from insurance and co-pay is zero dollars    Current Medications: Zetia  10 mg daily  Intolerances: rosuvastatin  10 mg, atorvastatin  40 mg and lovastatin  40 mg - severe myalgia  Risk Factors: premature CAD, recent NSTEMI, DM, HTN, hx of CVA LDL goal: <55 mg/dl  Last lab: TC 807, TG 896, HDL 46, LDLc 125  Diet: plant based diet   Exercise: 4 days a week gym 60-90 min at a time   Family History:   Relation Problem Comments  Mother (Alive) Diabetes   Hypertension     Father Metallurgist) Epilepsy     Sister (Alive) Hypertension     Labs:  Lipid Panel     Component Value Date/Time   CHOL 192 07/20/2024 0315   TRIG 103 07/20/2024 0315   HDL 46 07/20/2024 0315   CHOLHDL 4.2 07/20/2024 0315   VLDL 21 07/20/2024 0315   LDLCALC 125 (H) 07/20/2024 0315    Past Medical History:  Diagnosis Date   Diabetes mellitus without complication (HCC) 2013   previously on victoza    Gestational diabetes 2009    Hypertension 09/07/2014   Stroke (HCC) 09/07/2014   R side. weakness in upper arm. No deficit in leg. deficit in speech. A littel bit in coordination.     Current Outpatient Medications on File Prior to Visit  Medication Sig Dispense Refill   aspirin  EC 81 MG tablet Take 1 tablet (81 mg total) by mouth daily. 90 tablet 3   Blood Glucose Monitoring Suppl (TRUE METRIX METER) w/Device KIT 1 each by Does not apply route as needed. 1 kit 0   Continuous Glucose Sensor (FREESTYLE LIBRE 2 SENSOR) MISC INJECT 1 SENSOR INTO THE SKIN EVERY 14 DAYS FOR CONTINUOUS GLUCOSE MONITORING 2 each 0   ezetimibe  (ZETIA ) 10 MG tablet Take 1 tablet (10 mg total) by mouth daily. 90 tablet  1   FARXIGA 5 MG TABS tablet Take 5 mg by mouth in the morning.     glucose blood (TRUE METRIX BLOOD GLUCOSE TEST) test strip 1 each by Other route 2 (two) times daily. 100 each 11   Insulin  Pen Needle (PEN NEEDLES) 31G X 6 MM MISC 1 application  by Does not apply route at bedtime. 90 each 1   losartan -hydrochlorothiazide  (HYZAAR) 100-12.5 MG tablet Take 1 tablet by mouth daily. 90 tablet 1   metoprolol  succinate (TOPROL -XL) 50 MG 24 hr tablet Take 1 tablet (50 mg total) by mouth daily. Take with or immediately following a meal. 90 tablet 1   MOUNJARO  7.5 MG/0.5ML Pen INJECT 7.5 MG SUBCUTANEOUSLY ONCE A WEEK (Patient taking differently: Inject 7.5 mg into the skin once a week. On Monday) 4 mL 0   nitroGLYCERIN  (NITROSTAT ) 0.4  MG SL tablet Place 1 tablet (0.4 mg total) under the tongue every 5 (five) minutes as needed for chest pain. 25 tablet 3   ticagrelor  (BRILINTA ) 90 MG TABS tablet Take 1 tablet (90 mg total) by mouth 2 (two) times daily. 60 tablet 11   TRUEPLUS LANCETS 28G MISC 1 each by Does not apply route 2 (two) times daily. 100 each 11   UNABLE TO FIND Take 1 tablet by mouth daily. Passion Flower     No current facility-administered medications on file prior to visit.    Allergies  Allergen Reactions   Lipitor [Atorvastatin ] Other (See Comments)    Made me feel woozy   Lovastatin  Other (See Comments)   Other Other (See Comments)    Woozy feeling   Statins     Assessment/Plan:  1. Hyperlipidemia -  Problem  Hyperlipidemia Ldl Goal <55   Current Medications: Zetia  10 mg daily  Intolerances: statins rosuvastatin  10 mg, atorvastatin  40 mg and lovastatin  40 mg  Risk Factors: premature CAD, recent NSTEMI, DM, HTN, hx of CVA LDL goal: <55 mg/dl  Last lab: TC 807, TG 896, HDL 46, LDLc 125     Hyperlipidemia LDL goal <55 Assessment:  LDL goal: < 125 mg/dl last LDLc 874 mg/dl post NSTEMI lab, was not on any therapy  Zetia  started in the hospital - tolerates it well  Intolerance to statins - rosuvastatin  10 mg, atorvastatin  40 mg and lovastatin  40 mg  Discussed next potential options (PCSK-9 inhibitors, bempedoic acid and inclisiran); cost, dosing efficacy, side effects  Follows heart healthy diet and before NSTEMI was going to gym 4 days per week regularly   Plan: Continue taking current medications (Zetia  10 mg daily) Start taking Repatha  140 mg every 14 days  F/u lipid and Lp(a) due Nov 2025     Thank you,  Robbi Blanch, Pharm.D Poneto Elspeth BIRCH. [facility] [address], [place], [place] [address] Phone: [phone]; Fax: [phone]

## 2024-08-16 NOTE — Patient Instructions (Signed)
 Your Results:             Your most recent labs Goal  Total Cholesterol 192 < 200  Triglycerides 103 < 150  HDL (happy/good cholesterol) 46 > 40  LDL (lousy/bad cholesterol 125 < 55   Medication changes: continue taking Zetia  10 mg daily and start taking Repatha  140 mg under the skin every 14 days    Repatha  is a cholesterol medication that improved your body's ability to get rid of bad cholesterol known as LDL. It can lower your LDL up to 60%! It is an injection that is given under the skin every 2 weeks. The medication often requires a prior authorization from your insurance company. We will take care of submitting all the necessary information to your insurance company to get it approved. The most common side effects of Repatha  include runny nose, symptoms of the common cold, rarely flu or flu-like symptoms, back/muscle pain in about 3-4% of the patients, and redness, pain, or bruising at the injection site.   Lab orders: We want to repeat labs after 2-3 months.  We will send you a lab order to remind you once we get closer to that time.

## 2024-08-16 NOTE — Telephone Encounter (Signed)
 Pharmacy Patient Advocate Encounter  Received notification from [facility] ADVANTAGE/RX ADVANCE that Prior Authorization for repatha  has been APPROVED from 08/16/24 to 02/12/25. Ran test claim, Copay is $0.00. This test claim was processed through [facility]- copay amounts may vary at other pharmacies due to pharmacy/plan contracts, or as the patient moves through the different stages of their insurance plan.   PA #/Case ID/Reference #: [id number]    Pharmacy Patient Advocate Encounter   Received notification from Pt Calls Messages that prior authorization for repatha  is required/requested.   Insurance verification completed.   The patient is insured through [facility] ADVANTAGE/RX ADVANCE .   Per test claim: PA required; PA submitted to above mentioned insurance via Latent Key/confirmation #/[id number] Status is pending     Per pt calls

## 2024-08-16 NOTE — Telephone Encounter (Signed)
 No response back from pt in regards to cardiac rehab.   Closed referral

## 2024-08-16 NOTE — Telephone Encounter (Signed)
 Prior Authorization for repatha  has been APPROVED from 08/16/24 to 02/12/25.  See OV notes for more info

## 2024-08-26 ENCOUNTER — Ambulatory Visit: Admitting: Internal Medicine

## 2024-09-06 ENCOUNTER — Other Ambulatory Visit (HOSPITAL_COMMUNITY): Payer: Self-pay

## 2024-09-09 ENCOUNTER — Other Ambulatory Visit (HOSPITAL_COMMUNITY): Payer: Self-pay

## 2024-09-09 DIAGNOSIS — H2513 Age-related nuclear cataract, bilateral: Secondary | ICD-10-CM | POA: Diagnosis not present

## 2024-09-09 DIAGNOSIS — H3582 Retinal ischemia: Secondary | ICD-10-CM | POA: Diagnosis not present

## 2024-09-09 DIAGNOSIS — H59813 Chorioretinal scars after surgery for detachment, bilateral: Secondary | ICD-10-CM | POA: Diagnosis not present

## 2024-09-09 DIAGNOSIS — E113513 Type 2 diabetes mellitus with proliferative diabetic retinopathy with macular edema, bilateral: Secondary | ICD-10-CM | POA: Diagnosis not present

## 2024-09-23 DIAGNOSIS — E113512 Type 2 diabetes mellitus with proliferative diabetic retinopathy with macular edema, left eye: Secondary | ICD-10-CM | POA: Diagnosis not present

## 2024-09-26 DIAGNOSIS — E113511 Type 2 diabetes mellitus with proliferative diabetic retinopathy with macular edema, right eye: Secondary | ICD-10-CM | POA: Diagnosis not present

## 2024-10-10 DIAGNOSIS — Z6832 Body mass index (BMI) 32.0-32.9, adult: Secondary | ICD-10-CM | POA: Diagnosis not present

## 2024-10-10 DIAGNOSIS — E119 Type 2 diabetes mellitus without complications: Secondary | ICD-10-CM | POA: Diagnosis not present

## 2024-10-10 DIAGNOSIS — I10 Essential (primary) hypertension: Secondary | ICD-10-CM | POA: Diagnosis not present

## 2024-10-10 DIAGNOSIS — K760 Fatty (change of) liver, not elsewhere classified: Secondary | ICD-10-CM | POA: Diagnosis not present

## 2024-10-10 DIAGNOSIS — I2510 Atherosclerotic heart disease of native coronary artery without angina pectoris: Secondary | ICD-10-CM | POA: Diagnosis not present

## 2024-10-10 DIAGNOSIS — R160 Hepatomegaly, not elsewhere classified: Secondary | ICD-10-CM | POA: Diagnosis not present

## 2024-10-10 DIAGNOSIS — K8020 Calculus of gallbladder without cholecystitis without obstruction: Secondary | ICD-10-CM | POA: Diagnosis not present

## 2024-10-10 DIAGNOSIS — Z09 Encounter for follow-up examination after completed treatment for conditions other than malignant neoplasm: Secondary | ICD-10-CM | POA: Diagnosis not present

## 2024-10-10 DIAGNOSIS — E7800 Pure hypercholesterolemia, unspecified: Secondary | ICD-10-CM | POA: Diagnosis not present

## 2024-10-10 DIAGNOSIS — Z794 Long term (current) use of insulin: Secondary | ICD-10-CM | POA: Diagnosis not present

## 2024-10-10 DIAGNOSIS — Z8673 Personal history of transient ischemic attack (TIA), and cerebral infarction without residual deficits: Secondary | ICD-10-CM | POA: Diagnosis not present

## 2024-10-10 DIAGNOSIS — Z79899 Other long term (current) drug therapy: Secondary | ICD-10-CM | POA: Diagnosis not present

## 2024-10-28 DIAGNOSIS — E113512 Type 2 diabetes mellitus with proliferative diabetic retinopathy with macular edema, left eye: Secondary | ICD-10-CM | POA: Diagnosis not present

## 2024-11-25 ENCOUNTER — Encounter: Payer: Self-pay | Admitting: Cardiovascular Disease

## 2024-11-25 DIAGNOSIS — E113511 Type 2 diabetes mellitus with proliferative diabetic retinopathy with macular edema, right eye: Secondary | ICD-10-CM | POA: Diagnosis not present

## 2024-11-25 NOTE — Progress Notes (Deleted)
 No chief complaint on file.  History of Present Illness: 58 yo female with history of HTN, HLD, DM, CVA and CAD who is here today for follow up. She was admitted to [facility] in August 2025 with an acute inferior STEMI. The proximal RCA was occluded and treated with a drug eluting stent. Echo August 2025 with LVEF=60-65%. She was seen in our office in August 2025 and was doing well.   She is here today for follow up. The patient denies any chest pain, dyspnea, palpitations, lower extremity edema, orthopnea, PND, dizziness, near syncope or syncope.   Primary Care Physician: Claudene Round, MD   Past Medical History:  Diagnosis Date   CAD (coronary artery disease)    Diabetes mellitus without complication (HCC) 12/20/2011   previously on victoza    Gestational diabetes 12/20/2007   Hypertension 09/07/2014   Stroke (HCC) 09/07/2014   R side. weakness in upper arm. No deficit in leg. deficit in speech. A littel bit in coordination.     Past Surgical History:  Procedure Laterality Date   BREAST SURGERY     CESAREAN SECTION     CORONARY/GRAFT ACUTE MI REVASCULARIZATION N/A 07/19/2024   Procedure: Coronary/Graft Acute MI Revascularization;  Surgeon: Verlin Lonni BIRCH, MD;  Location: [facility] INVASIVE CV LAB;  Service: Cardiovascular;  Laterality: N/A;   LEFT HEART CATH AND CORONARY ANGIOGRAPHY N/A 07/19/2024   Procedure: LEFT HEART CATH AND CORONARY ANGIOGRAPHY;  Surgeon: Verlin Lonni BIRCH, MD;  Location: [facility] INVASIVE CV LAB;  Service: Cardiovascular;  Laterality: N/A;   TUBAL LIGATION  2009    Current Outpatient Medications  Medication Sig Dispense Refill   aspirin  EC 81 MG tablet Take 1 tablet (81 mg total) by mouth daily. 90 tablet 3   Blood Glucose Monitoring Suppl (TRUE METRIX METER) w/Device KIT 1 each by Does not apply route as needed. 1 kit 0   Continuous Glucose Sensor (FREESTYLE LIBRE 2 SENSOR) MISC INJECT 1 SENSOR INTO THE SKIN EVERY 14 DAYS FOR CONTINUOUS GLUCOSE MONITORING 2  each 0   Evolocumab  (REPATHA  SURECLICK) 140 MG/ML SOAJ Inject 140 mg into the skin every 14 (fourteen) days. 6 mL 3   ezetimibe  (ZETIA ) 10 MG tablet Take 1 tablet (10 mg total) by mouth daily. 90 tablet 1   FARXIGA 5 MG TABS tablet Take 5 mg by mouth in the morning.     glucose blood (TRUE METRIX BLOOD GLUCOSE TEST) test strip 1 each by Other route 2 (two) times daily. 100 each 11   Insulin  Pen Needle (PEN NEEDLES) 31G X 6 MM MISC 1 application  by Does not apply route at bedtime. 90 each 1   losartan -hydrochlorothiazide  (HYZAAR) 100-12.5 MG tablet Take 1 tablet by mouth daily. 90 tablet 1   metoprolol  succinate (TOPROL -XL) 50 MG 24 hr tablet Take 1 tablet (50 mg total) by mouth daily. Take with or immediately following a meal. 90 tablet 1   MOUNJARO  7.5 MG/0.5ML Pen INJECT 7.5 MG SUBCUTANEOUSLY ONCE A WEEK (Patient taking differently: Inject 7.5 mg into the skin once a week. On Monday) 4 mL 0   nitroGLYCERIN  (NITROSTAT ) 0.4 MG SL tablet Place 1 tablet (0.4 mg total) under the tongue every 5 (five) minutes as needed for chest pain. 25 tablet 3   ticagrelor  (BRILINTA ) 90 MG TABS tablet Take 1 tablet (90 mg total) by mouth 2 (two) times daily. 60 tablet 11   TRUEPLUS LANCETS 28G MISC 1 each by Does not apply route 2 (two) times daily.  100 each 11   UNABLE TO FIND Take 1 tablet by mouth daily. Passion Flower     No current facility-administered medications for this visit.    Allergies  Allergen Reactions   Lipitor [Atorvastatin ] Other (See Comments)    Made me feel woozy   Lovastatin  Other (See Comments)   Other Other (See Comments)    Woozy feeling   Statins     Social History   Socioeconomic History   Marital status: Married    Spouse name: Not on file   Number of children: 5   Years of education: associates   Highest education level: Not on file  Occupational History   Occupation: disabled  Tobacco Use   Smoking status: Never   Smokeless tobacco: Never  Vaping Use   Vaping  status: Never Used  Substance and Sexual Activity   Alcohol  use: No   Drug use: Not Currently    Types: Marijuana   Sexual activity: Not Currently  Other Topics Concern   Not on file  Social History Narrative   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   Social Drivers of Corporate Investment Banker Strain: Low Risk  (04/07/2023)   Overall Financial Resource Strain (CARDIA)    Difficulty of Paying Living Expenses: Not hard at all  Food Insecurity: No Food Insecurity (07/19/2024)   Hunger Vital Sign    Worried About Running Out of Food in the Last Year: Never true    Ran Out of Food in the Last Year: Never true  Transportation Needs: No Transportation Needs (07/19/2024)   PRAPARE - Administrator, Civil Service (Medical): No    Lack of Transportation (Non-Medical): No  Physical Activity: Sufficiently Active (04/07/2023)   Exercise Vital Sign    Days of Exercise per Week: 7 days    Minutes of Exercise per Session: 60 min  Stress: No Stress Concern Present (04/07/2023)   [employer] of Occupational Health - Occupational Stress Questionnaire    Feeling of Stress : Not at all  Social Connections: Unknown (10/07/2022)   Received from [facility]   Social Network    Social Network: Not on file  Recent Concern: Social Connections - Moderately Isolated (08/09/2022)   Social Connection and Isolation Panel    Frequency of Communication with Friends and Family: Three times a week    Frequency of Social Gatherings with Friends and Family: Three times a week    Attends Religious Services: Never    Active Member of Clubs or Organizations: No    Attends Banker Meetings: Never    Marital Status: Married  Catering Manager Violence: Not At Risk (07/19/2024)   Humiliation, Afraid, Rape, and Kick questionnaire    Fear of Current or Ex-Partner: No    Emotionally Abused: No    Physically Abused: No    Sexually Abused: No     Family History  Problem Relation Age of Onset   Diabetes Mother    Hypertension Mother    Epilepsy Father    Hypertension Sister    Colon cancer Neg Hx     Review of Systems:  As stated in the HPI and otherwise negative.   LMP 05/19/2017   Physical Examination: General: Well developed, well nourished, NAD  HEENT: OP clear, mucus membranes moist  SKIN: warm, dry. No rashes. Neuro: No focal deficits  Musculoskeletal: Muscle strength 5/5 all ext  Psychiatric: Mood and affect normal  Neck: No JVD, no carotid bruits, no thyromegaly, no lymphadenopathy.  Lungs:Clear bilaterally, no wheezes, rhonci, crackles Cardiovascular: Regular rate and rhythm. No murmurs, gallops or rubs. Abdomen:Soft. Bowel sounds present. Non-tender.  Extremities: No lower extremity edema. Pulses are 2 + in the bilateral DP/PT.  EKG:  EKG {ACTION; IS/IS WNU:[id number]} ordered today. The ekg ordered today demonstrates ***  Recent Labs: 07/19/2024: Magnesium 1.6 07/20/2024: BUN 9; Creatinine, Ser 0.73; Hemoglobin 10.9; Platelets 268; Potassium 3.8; Sodium 138   Lipid Panel    Component Value Date/Time   CHOL 192 07/20/2024 0315   TRIG 103 07/20/2024 0315   HDL 46 07/20/2024 0315   CHOLHDL 4.2 07/20/2024 0315   VLDL 21 07/20/2024 0315   LDLCALC 125 (H) 07/20/2024 0315     Wt Readings from Last 3 Encounters:  07/29/24 174 lb 3.2 oz (79 kg)  07/19/24 181 lb (82.1 kg)  11/01/23 173 lb 12.8 oz (78.8 kg)    Assessment and Plan:   1. CAD without angina: No chest pain. Continue ASA, Brilinta , Toprol , Zetia  and Repatha .   2. HTN: BP is well controlled. Continue current therapy  3. HLD: LDL 125 in August 2025. She is now on Repatha  and Zetia . Check lipid panel and Lp(a) now  Labs/ tests ordered today include:  No orders of the defined types were placed in this encounter.    Disposition:   F/U with me in ***    Signed, Lonni Cash, MD, [facility] 11/25/2024 3:12 PM    [facility] [address] [place], [place], [place]  [address] Phone: [phone]; Fax: [phone]

## 2024-11-26 ENCOUNTER — Ambulatory Visit: Attending: Cardiovascular Disease | Admitting: Cardiovascular Disease

## 2024-12-05 ENCOUNTER — Ambulatory Visit: Admitting: Physician Assistant

## 2024-12-05 ENCOUNTER — Encounter: Payer: Self-pay | Admitting: Physician Assistant

## 2024-12-05 VITALS — BP 160/84 | Ht 59.0 in | Wt 170.6 lb

## 2024-12-05 DIAGNOSIS — I25119 Atherosclerotic heart disease of native coronary artery with unspecified angina pectoris: Secondary | ICD-10-CM | POA: Diagnosis not present

## 2024-12-05 DIAGNOSIS — I10 Essential (primary) hypertension: Secondary | ICD-10-CM

## 2024-12-05 DIAGNOSIS — Z79899 Other long term (current) drug therapy: Secondary | ICD-10-CM

## 2024-12-05 DIAGNOSIS — E119 Type 2 diabetes mellitus without complications: Secondary | ICD-10-CM | POA: Diagnosis not present

## 2024-12-05 DIAGNOSIS — E785 Hyperlipidemia, unspecified: Secondary | ICD-10-CM

## 2024-12-05 DIAGNOSIS — R0789 Other chest pain: Secondary | ICD-10-CM

## 2024-12-05 MED ORDER — EZETIMIBE 10 MG PO TABS: 10.0000 mg | ORAL_TABLET | Freq: Every day | ORAL | 1 refills | Status: AC

## 2024-12-05 MED ORDER — ISOSORBIDE MONONITRATE ER 30 MG PO TB24: 30.0000 mg | ORAL_TABLET | Freq: Every day | ORAL | 3 refills | Status: AC

## 2024-12-05 NOTE — Progress Notes (Unsigned)
 Cardiology Office Note   Date:  12/05/2024  ID:  ABI SHOULTS, DOB 1966-02-11, MRN [id number] PCP: Claudene Round, MD  [facility] Providers Cardiologist:  Lonni Cash, MD  Lipid specialist: Dr. Mona   History of Present Illness [name] is a 58 y.o. female with past medical history of hypertension, hyperlipidemia, DM 2, and history of CVA. Patient was previously followed by Dr. Mona for management of hyperlipidemia. She was previously approved for Repatha  in 2023. More recently, patient presented to the hospital as a code STEMI on 07/19/2024. Emergent cardiac catheterization performed on the same day showed 50% OM1, 50% OM 3, 50% mid LAD, 100% occluded proximal RCA, 80% mid RCA lesion. She underwent DES to proximal and mid RCA. Postprocedure, patient was started on aspirin  and Brilinta . She was statin intolerant. Echocardiogram obtained on the following morning showed EF 60 to 65%, no regional wall motion abnormality, normal RV, no significant valve issue. Serial troponin was greater than 24,000. Lipoprotein a is elevated at 163.9. LDL was 125 during the hospitalization. Hemoglobin A1c was 8.8.   I last saw the patient in August 2025 at which time she was doing well, however she was only taking Brilinta  once a day, I asked her to start taking Brilinta  twice a day.  I increased her metoprolol  succinate to 50 mg daily.  She was started on Zetia  during the hospitalization due to intolerances of statin.  I referred her to lipid clinic for consideration of PCSK9 inhibitor.  She wished to keep Dr. Mona as her lipid specialist and follow-up with Dr. Cash as her general cardiologist.  She has been seen by our clinical pharmacist 08/16/2024 and was started on Repatha .  Patient presents today for follow-up.  She appears to be a little drowsy, however she says she is feeling well however was rushing to get here.  Her blood pressure is elevated.  Even on manual recheck by myself her  blood pressure was 150/86.  She has not taken her Hyzaar this morning.  She has been having very mild chest pressure intermittently.  I will start her on Imdur 30 mg daily.  I recommended CBC, CMP and a fasting lipid panel in the near future.  Otherwise, she can follow-up with Dr. Cash in 3 months.  I gave her ED precaution, if her chest pain come back and still persist after 3 nitroglycerin , she will need to go to the emergency room.  ROS: ***  Studies Reviewed      *** Risk Assessment/Calculations {Does this patient have ATRIAL FIBRILLATION?:[id number]} The patient's 1st BP is elevated (>139/89)*** Repeat BP and {Click to enter a 2nd BP Refresh Note  :1}       Physical Exam VS:  BP (!) 160/84 (BP Location: Left Arm, Patient Position: Sitting, Cuff Size: Large)   Ht 4' 11 (1.499 m)   Wt 170 lb 9.6 oz (77.4 kg)   LMP 05/19/2017   SpO2 100%   BMI 34.46 kg/m        Wt Readings from Last 3 Encounters:  12/05/24 170 lb 9.6 oz (77.4 kg)  07/29/24 174 lb 3.2 oz (79 kg)  07/19/24 181 lb (82.1 kg)    GEN: Well nourished, well developed in no acute distress NECK: No JVD; No carotid bruits CARDIAC: ***RRR, no murmurs, rubs, gallops RESPIRATORY:  Clear to auscultation without rales, wheezing or rhonchi  ABDOMEN: Soft, non-tender, non-distended EXTREMITIES:  No edema; No deformity   ASSESSMENT AND PLAN ***    {  Are you ordering a CV Procedure (e.g. stress test, cath, DCCV, TEE, etc)?   Press F2        :[id number]}  Dispo: ***  Signed, Scot Ford, PA

## 2024-12-05 NOTE — Patient Instructions (Signed)
 Medication Instructions:  START IMDUR 30 MG DAILY *If you need a refill on your cardiac medications before your next appointment, please call your pharmacy*  Lab Work: FASTING LIPID PANEL , CBC AND CMP WITHIN 3 WEEKS If you have labs (blood work) drawn today and your tests are completely normal, you will receive your results only by: MyChart Message (if you have MyChart) OR A paper copy in the mail If you have any lab test that is abnormal or we need to change your treatment, we will call you to review the results.  Testing/Procedures: NO TESTING  Follow-Up: At [facility], you and your health needs are our priority.  As part of our continuing mission to provide you with exceptional heart care, our providers are all part of one team.  This team includes your primary Cardiologist (physician) and Advanced Practice Providers or APPs (Physician Assistants and Nurse Practitioners) who all work together to provide you with the care you need, when you need it.  Your next appointment:   3 month(s)  Provider:   Lonni Cash, MD
